# Patient Record
Sex: Female | Born: 1965
Health system: Southern US, Community
[De-identification: ages and names within clinical notes are randomized; demographics above are authoritative.]

## PROBLEM LIST (undated history)

## (undated) DIAGNOSIS — I10 Essential (primary) hypertension: Secondary | ICD-10-CM

## (undated) HISTORY — DX: Essential (primary) hypertension: I10

---

## 2004-09-10 ENCOUNTER — Other Ambulatory Visit: Payer: Self-pay

## 2004-09-10 ENCOUNTER — Emergency Department: Payer: Self-pay | Admitting: Emergency Medicine

## 2006-11-16 ENCOUNTER — Emergency Department: Payer: Self-pay | Admitting: Emergency Medicine

## 2008-11-08 ENCOUNTER — Inpatient Hospital Stay: Payer: Self-pay | Admitting: Internal Medicine

## 2008-11-23 ENCOUNTER — Ambulatory Visit: Payer: Self-pay | Admitting: Orthopedic Surgery

## 2009-02-03 HISTORY — PX: SHOULDER SURGERY: SHX246

## 2010-08-08 ENCOUNTER — Ambulatory Visit: Payer: Self-pay | Admitting: Nephrology

## 2011-08-12 ENCOUNTER — Ambulatory Visit: Payer: Self-pay

## 2014-07-13 ENCOUNTER — Other Ambulatory Visit: Payer: Self-pay | Admitting: Unknown Physician Specialty

## 2014-07-13 DIAGNOSIS — I10 Essential (primary) hypertension: Secondary | ICD-10-CM | POA: Insufficient documentation

## 2014-07-13 DIAGNOSIS — E7800 Pure hypercholesterolemia, unspecified: Secondary | ICD-10-CM | POA: Insufficient documentation

## 2014-07-13 DIAGNOSIS — E785 Hyperlipidemia, unspecified: Secondary | ICD-10-CM

## 2014-07-14 ENCOUNTER — Encounter: Payer: Self-pay | Admitting: Unknown Physician Specialty

## 2014-07-14 ENCOUNTER — Ambulatory Visit (INDEPENDENT_AMBULATORY_CARE_PROVIDER_SITE_OTHER): Payer: Managed Care, Other (non HMO) | Admitting: Unknown Physician Specialty

## 2014-07-14 VITALS — BP 123/91 | HR 60 | Temp 98.5°F | Ht 63.0 in | Wt 197.6 lb

## 2014-07-14 DIAGNOSIS — E7800 Pure hypercholesterolemia, unspecified: Secondary | ICD-10-CM

## 2014-07-14 DIAGNOSIS — Z975 Presence of (intrauterine) contraceptive device: Secondary | ICD-10-CM

## 2014-07-14 DIAGNOSIS — I10 Essential (primary) hypertension: Secondary | ICD-10-CM

## 2014-07-14 MED ORDER — ATENOLOL 50 MG PO TABS: 50.0000 mg | ORAL_TABLET | Freq: Every day | ORAL | Status: DC

## 2014-07-14 MED ORDER — AMLODIPINE BESYLATE 5 MG PO TABS: 5.0000 mg | ORAL_TABLET | Freq: Every day | ORAL | Status: DC

## 2014-07-14 MED ORDER — IRBESARTAN-HYDROCHLOROTHIAZIDE 300-12.5 MG PO TABS: 1.0000 | ORAL_TABLET | Freq: Every day | ORAL | Status: DC

## 2014-07-14 NOTE — Progress Notes (Signed)
BP 123/91 mmHg  Pulse 60  Temp(Src) 98.5 F (36.9 C) (Oral)  Ht 5\' 3"  (1.6 m)  Wt 197 lb 9.6 oz (89.631 kg)  BMI 35.01 kg/m2  SpO2 97%  LMP  (Within Months)   Subjective:    Patient ID: Rhonda Stanley, female    DOB: 29-Aug-1965, 49 y.o.   MRN: [id number]  HPI: [name] is a 49 y.o. female presenting on 07/14/2014 for Follow-up  Hypertension This is a chronic problem. Progression since onset: BP under control but dentist noted one medicatinon is causing gum hyperplasion and needed an extensive procedure.   Associated symptoms include PND. Pertinent negatives include no anxiety, blurred vision, chest pain, headaches, malaise/fatigue, neck pain, orthopnea, palpitations, peripheral edema, shortness of breath or sweats. There are no associated agents to hypertension. Risk factors for coronary artery disease include obesity. The current treatment provides significant improvement. There are no compliance problems.      Relevant past medical, surgical, family and social history reviewed and updated as indicated. Interim medical history since our last visit reviewed. Allergies and medications reviewed and updated.  Review of Systems  Constitutional: Negative for malaise/fatigue.  Eyes: Negative for blurred vision.  Respiratory: Negative for shortness of breath.   Cardiovascular: Positive for PND. Negative for chest pain, palpitations and orthopnea.  Musculoskeletal: Negative for neck pain.  Neurological: Negative for headaches.    Per HPI unless specifically indicated above     Objective:    BP 123/91 mmHg  Pulse 60  Temp(Src) 98.5 F (36.9 C) (Oral)  Ht 5\' 3"  (1.6 m)  Wt 197 lb 9.6 oz (89.631 kg)  BMI 35.01 kg/m2  SpO2 97%  LMP  (Within Months)  Wt Readings from Last 3 Encounters:  07/14/14 197 lb 9.6 oz (89.631 kg)  03/29/14 195 lb (88.451 kg)    Physical Exam  Constitutional: She is oriented to person, place, and time. She appears well-developed and  well-nourished. No distress.  HENT:  Head: Normocephalic and atraumatic.  Eyes: Conjunctivae and lids are normal. Right eye exhibits no discharge. Left eye exhibits no discharge. No scleral icterus.  Cardiovascular: Normal rate, regular rhythm and normal heart sounds.   Pulmonary/Chest: Effort normal and breath sounds normal. No respiratory distress.  Abdominal: Normal appearance and bowel sounds are normal. She exhibits no distension. There is no splenomegaly or hepatomegaly. There is no tenderness.  Musculoskeletal: Normal range of motion.  Neurological: She is alert and oriented to person, place, and time.  Skin: Skin is warm, dry and intact. No rash noted. No pallor.  Psychiatric: She has a normal mood and affect. Her behavior is normal. Judgment and thought content normal.      No results found for this or any previous visit.    Assessment & Plan:     Problem List Items Addressed This Visit      Cardiovascular and Mediastinum   Essential hypertension, benign - Primary    Stable, but having gum hyperplasia  Will reduce Amlodipine to 5 mg.  Recheck in 3 months.  She will check it at work.        Relevant Medications   amLODipine (NORVASC) 5 MG tablet   atenolol (TENORMIN) 50 MG tablet   irbesartan-hydrochlorothiazide (AVALIDE) 300-12.5 MG per tablet     Other   Hypercholesterolemia    Check Lipid panel      Relevant Medications   amLODipine (NORVASC) 5 MG tablet   atenolol (TENORMIN) 50 MG tablet   irbesartan-hydrochlorothiazide (AVALIDE)  300-12.5 MG per tablet    Other Visit Diagnoses    IUD (intrauterine device) in place        Relevant Orders    Ambulatory referral to Obstetrics / Gynecology      LIPID PANEL PICCOLO, WAIVED [Ordered] MICROALBUMIN, URINE WAIVED [Ordered] COMPREHENSIVE METABOLIC PANEL [Ordered] URIC ACID [Ordered]  Follow up plan: No Follow-up on file.  Needs PE but also needs new IUD.  Refer to gyn.  Recheck in 3 months.

## 2014-07-14 NOTE — Patient Instructions (Signed)

## 2014-07-14 NOTE — Assessment & Plan Note (Signed)
Check Lipid panel 

## 2014-07-14 NOTE — Assessment & Plan Note (Signed)
Stable, but having gum hyperplasia  Will reduce Amlodipine to 5 mg.  Recheck in 3 months.  She will check it at work.

## 2014-07-14 NOTE — Addendum Note (Signed)
Addended by: Amie Critchley on: 07/14/2014 02:50 PM   Modules accepted: Orders

## 2014-07-15 LAB — LIPID PANEL W/O CHOL/HDL RATIO
Cholesterol, Total: 241 mg/dL — ABNORMAL HIGH (ref 100–199)
HDL: 42 mg/dL (ref >39)
LDL Calculated: 172 mg/dL — ABNORMAL HIGH (ref 0–99)
TRIGLYCERIDES: 136 mg/dL (ref 0–149)
VLDL CHOLESTEROL CAL: 27 mg/dL (ref 5–40)

## 2014-07-15 LAB — MICROALBUMIN / CREATININE URINE RATIO
Creatinine, Urine: 117.2 mg/dL
MICROALB/CREAT RATIO: <2.6 mg/g creat (ref 0.0–30.0)
Microalbumin, Urine: <3 ug/mL

## 2014-07-17 ENCOUNTER — Telehealth: Payer: Self-pay

## 2014-07-17 MED ORDER — ATORVASTATIN CALCIUM 10 MG PO TABS: 10.0000 mg | ORAL_TABLET | Freq: Every day | ORAL | Status: DC

## 2014-07-17 NOTE — Telephone Encounter (Signed)
Called pt and she agreed to start Atorvastatin. She stated it was okay to send to her pharmacy. She already has an appt scheduled for 10/13/14 so the cholesterol can be rechecked then.

## 2014-07-17 NOTE — Telephone Encounter (Signed)
-----   Message from Cheryl Wicker, NP sent at 07/17/2014  8:11 AM EDT ----- Call tell patient that her cholesterol is high and recommended that she takes cholesterol medications.  I recommend that she start Atorvastatin and to recheck in 3 months.  Can I call this in for her? 

## 2014-07-17 NOTE — Telephone Encounter (Signed)
Pt agreed to start taking medication, Atorvastatin. She stated it was fine to call it in to the pharmacy.

## 2014-07-17 NOTE — Telephone Encounter (Signed)
-----   Message from Gabriel Cirri, NP sent at 07/17/2014  8:11 AM EDT ----- Call tell patient that her cholesterol is high and recommended that she takes cholesterol medications.  I recommend that she start Atorvastatin and to recheck in 3 months.  Can I call this in for her?

## 2014-07-17 NOTE — Telephone Encounter (Signed)
Left message for pt to return call.

## 2014-07-20 LAB — COMPREHENSIVE METABOLIC PANEL
A/G RATIO: 1.2 (ref 1.1–2.5)
ALT: 21 IU/L (ref 0–32)
AST: 19 IU/L (ref 0–40)
Albumin: 4.2 g/dL (ref 3.5–5.5)
Alkaline Phosphatase: 104 IU/L (ref 39–117)
BUN/Creatinine Ratio: 20 (ref 9–23)
BUN: 16 mg/dL (ref 6–24)
Bilirubin Total: 0.6 mg/dL (ref 0.0–1.2)
CALCIUM: 9.9 mg/dL (ref 8.7–10.2)
CO2: 23 mmol/L (ref 18–29)
Chloride: 98 mmol/L (ref 97–108)
Creatinine, Ser: 0.8 mg/dL (ref 0.57–1.00)
GFR calc Af Amer: 102 mL/min/{1.73_m2} (ref >59)
GFR calc non Af Amer: 88 mL/min/{1.73_m2} (ref >59)
GLOBULIN, TOTAL: 3.5 g/dL (ref 1.5–4.5)
Glucose: 102 mg/dL — ABNORMAL HIGH (ref 65–99)
POTASSIUM: 4.2 mmol/L (ref 3.5–5.2)
SODIUM: 139 mmol/L (ref 134–144)
Total Protein: 7.7 g/dL (ref 6.0–8.5)

## 2014-07-20 LAB — SPECIMEN STATUS REPORT

## 2014-07-20 LAB — URIC ACID: Uric Acid: 3.9 mg/dL (ref 2.5–7.1)

## 2014-08-08 ENCOUNTER — Other Ambulatory Visit: Payer: Self-pay

## 2014-08-16 ENCOUNTER — Encounter: Payer: Managed Care, Other (non HMO) | Admitting: Obstetrics and Gynecology

## 2014-09-18 ENCOUNTER — Telehealth: Payer: Self-pay | Admitting: Unknown Physician Specialty

## 2014-09-18 NOTE — Telephone Encounter (Signed)
E-fax came through for refill on: °Rx: atenolol (TENORMIN) 50 MG tablet °Copy in basket. °

## 2014-09-18 NOTE — Telephone Encounter (Signed)
Medication was refilled in June.

## 2014-09-19 ENCOUNTER — Other Ambulatory Visit: Payer: Self-pay | Admitting: Unknown Physician Specialty

## 2014-09-19 DIAGNOSIS — I10 Essential (primary) hypertension: Secondary | ICD-10-CM

## 2014-09-19 MED ORDER — ATENOLOL 50 MG PO TABS: 50.0000 mg | ORAL_TABLET | Freq: Every day | ORAL | Status: DC

## 2014-09-19 MED ORDER — ATORVASTATIN CALCIUM 10 MG PO TABS: 10.0000 mg | ORAL_TABLET | Freq: Every day | ORAL | Status: DC

## 2014-09-19 MED ORDER — IRBESARTAN-HYDROCHLOROTHIAZIDE 300-12.5 MG PO TABS: 1.0000 | ORAL_TABLET | Freq: Every day | ORAL | Status: DC

## 2014-09-19 NOTE — Progress Notes (Signed)
Called and left voicemail for pt to return call to schedule an appointment with CW. Will update once call is returned and appt is scheduled. Thanks.

## 2014-09-19 NOTE — Progress Notes (Signed)
Pt needs to be seen

## 2014-09-20 ENCOUNTER — Telehealth: Payer: Self-pay | Admitting: Unknown Physician Specialty

## 2014-09-20 NOTE — Telephone Encounter (Signed)
Pt called stated she has an appt on 10/13/14, wants to know if she needs to be seen prior to that date. Thanks.

## 2014-09-20 NOTE — Progress Notes (Signed)
Patient has appointment scheduled on 10/13/14.

## 2014-09-20 NOTE — Telephone Encounter (Signed)
Called and left patient a voicemail stating that she does not need another appointment before 10/13/14. We got refill requests yesterday and it looks like Rhonda Stanley refilled them for a certain amount of time and she was just saying patient needs an appointment in order to get any more refills.

## 2014-10-13 ENCOUNTER — Ambulatory Visit (INDEPENDENT_AMBULATORY_CARE_PROVIDER_SITE_OTHER): Payer: Managed Care, Other (non HMO) | Admitting: Unknown Physician Specialty

## 2014-10-13 ENCOUNTER — Ambulatory Visit: Payer: Managed Care, Other (non HMO) | Admitting: Unknown Physician Specialty

## 2014-10-13 ENCOUNTER — Encounter: Payer: Self-pay | Admitting: Unknown Physician Specialty

## 2014-10-13 VITALS — BP 129/80 | HR 70 | Temp 98.5°F | Ht 64.0 in | Wt 201.8 lb

## 2014-10-13 DIAGNOSIS — E7800 Pure hypercholesterolemia, unspecified: Secondary | ICD-10-CM

## 2014-10-13 DIAGNOSIS — I10 Essential (primary) hypertension: Secondary | ICD-10-CM | POA: Diagnosis not present

## 2014-10-13 LAB — LIPID PANEL PICCOLO, WAIVED
CHOL/HDL RATIO PICCOLO,WAIVE: 3.4 mg/dL
Cholesterol Piccolo, Waived: 170 mg/dL (ref <200)
HDL Chol Piccolo, Waived: 51 mg/dL — ABNORMAL LOW (ref >59)
LDL Chol Calc Piccolo Waived: 104 mg/dL — ABNORMAL HIGH (ref <100)
TRIGLYCERIDES PICCOLO,WAIVED: 81 mg/dL (ref <150)
VLDL CHOL CALC PICCOLO,WAIVE: 16 mg/dL (ref <30)

## 2014-10-13 LAB — AST: AST: 29 IU/L (ref 0–40)

## 2014-10-13 LAB — ALT: ALT: 29 IU/L (ref 0–32)

## 2014-10-13 MED ORDER — ATORVASTATIN CALCIUM 10 MG PO TABS: 10.0000 mg | ORAL_TABLET | Freq: Every day | ORAL | Status: DC

## 2014-10-13 MED ORDER — ATENOLOL 50 MG PO TABS: 50.0000 mg | ORAL_TABLET | Freq: Every day | ORAL | Status: DC

## 2014-10-13 MED ORDER — IRBESARTAN-HYDROCHLOROTHIAZIDE 300-12.5 MG PO TABS: 1.0000 | ORAL_TABLET | Freq: Every day | ORAL | Status: DC

## 2014-10-13 MED ORDER — AMLODIPINE BESYLATE 5 MG PO TABS: 5.0000 mg | ORAL_TABLET | Freq: Every day | ORAL | Status: DC

## 2014-10-13 NOTE — Assessment & Plan Note (Signed)
Lipid panel well improved with addition of atorvastatin  daily. Recheck in 6 months.

## 2014-10-13 NOTE — Progress Notes (Signed)
BP 129/80 mmHg  Pulse 70  Temp(Src) 98.5 F (36.9 C)  Ht  (1.626 m)  Wt 201 lb 12.8 oz (91.536 kg)  BMI 34.62 kg/m2  SpO2 97%   Subjective:    Patient ID: Rhonda Stanley, female    DOB: 11-16-1965, 49 y.o.   MRN: [id number]  HPI: Rhonda Stanley is a 50 y.o. female  Chief Complaint  Patient presents with  . Hyperlipidemia  . Hypertension   Hypertension/Hyperlipidemia: Has monitored blood pressure at work with normal readings of 120's/80's. Has done well on decreased dose  of amlodipine . Started atorvastatin  with no complaints or concerns. Denies headache, chest pain or shortness of breath.    Relevant past medical, surgical, family and social history reviewed and updated as indicated. Interim medical history since our last visit reviewed. Allergies and medications reviewed and updated.  Review of Systems  Constitutional: Negative.   HENT: Negative.   Respiratory: Negative.  Negative for cough, chest tightness, shortness of breath, wheezing and stridor.   Cardiovascular: Negative.  Negative for chest pain, palpitations and leg swelling.  Skin: Negative.  Negative for color change, pallor, rash and wound.  Psychiatric/Behavioral: Negative.  Negative for confusion and sleep disturbance. The patient is not nervous/anxious.     Per HPI unless specifically indicated above     Objective:    BP 129/80 mmHg  Pulse 70  Temp(Src) 98.5 F (36.9 C)  Ht  (1.626 m)  Wt 201 lb 12.8 oz (91.536 kg)  BMI 34.62 kg/m2  SpO2 97%  Wt Readings from Last 3 Encounters:  10/13/14 201 lb 12.8 oz (91.536 kg)  07/14/14 197 lb 9.6 oz (89.631 kg)  03/29/14 195 lb (88.451 kg)    Physical Exam  Constitutional: She is oriented to person, place, and time. She appears well-developed and well-nourished. No distress.  HENT:  Head: Normocephalic and atraumatic.  Neck: Normal range of motion.  Cardiovascular: Normal rate, regular rhythm and normal heart sounds.  Exam  reveals no gallop.   No murmur heard. Pulmonary/Chest: Effort normal and breath sounds normal. No respiratory distress. She has no wheezes. She has no rales. She exhibits no tenderness.  Neurological: She is alert and oriented to person, place, and time.  Skin: Skin is warm and dry. No rash noted. She is not diaphoretic. No erythema.  Psychiatric: She has a normal mood and affect. Her behavior is normal. Judgment and thought content normal.        Assessment & Plan:   Problem List Items Addressed This Visit      Unprioritized   Essential hypertension, benign    Well controlled on current regimen with lower dose of amlodipine .       Relevant Medications   amLODipine (NORVASC) 5 MG tablet   atenolol (TENORMIN) 50 MG tablet   irbesartan-hydrochlorothiazide (AVALIDE) 300-12.5 MG per tablet   atorvastatin (LIPITOR) 10 MG tablet   Hypercholesterolemia - Primary    Lipid panel well improved with addition of atorvastatin  daily. Recheck in 6 months.      Relevant Medications   amLODipine (NORVASC) 5 MG tablet   atenolol (TENORMIN) 50 MG tablet   irbesartan-hydrochlorothiazide (AVALIDE) 300-12.5 MG per tablet   atorvastatin (LIPITOR) 10 MG tablet   Other Relevant Orders   Lipid Panel Piccolo, Waived   ALT   AST       Follow up plan: Return in about 6 months (around 04/12/2015) for Physical Exam.

## 2014-10-13 NOTE — Assessment & Plan Note (Addendum)
Well controlled on current regimen with lower dose of amlodipine .

## 2014-12-20 ENCOUNTER — Other Ambulatory Visit: Payer: Self-pay | Admitting: Unknown Physician Specialty

## 2014-12-21 ENCOUNTER — Telehealth: Payer: Self-pay

## 2014-12-21 NOTE — Telephone Encounter (Signed)
Patient called and stated that her pharmacy is stating that she does not have any refills on her medications. So I called the pharmacy and they stated they did not have any new refills for the patient. So since the medications were already in the chart, I gave them verbally to the pharmacy. I tried to call the patient to let her know but there was no answer and no voicemail. Will try again later to let her know.

## 2014-12-22 NOTE — Telephone Encounter (Signed)
I saw that Rhonda Stanley last looked at this message so I asked her if she spoke to the patient and told her that her medications were sent in and she stated yes she did tell her that information.

## 2015-04-13 ENCOUNTER — Encounter: Payer: Managed Care, Other (non HMO) | Admitting: Unknown Physician Specialty

## 2015-05-28 ENCOUNTER — Emergency Department: Payer: Managed Care, Other (non HMO)

## 2015-05-28 ENCOUNTER — Encounter: Payer: Self-pay | Admitting: Emergency Medicine

## 2015-05-28 ENCOUNTER — Emergency Department
Admission: EM | Admit: 2015-05-28 | Discharge: 2015-05-28 | Disposition: A | Discharge status: Home / Self Care | Payer: Managed Care, Other (non HMO) | Attending: Emergency Medicine | Admitting: Emergency Medicine

## 2015-05-28 DIAGNOSIS — Z79899 Other long term (current) drug therapy: Secondary | ICD-10-CM | POA: Insufficient documentation

## 2015-05-28 DIAGNOSIS — R0789 Other chest pain: Secondary | ICD-10-CM

## 2015-05-28 DIAGNOSIS — I10 Essential (primary) hypertension: Secondary | ICD-10-CM | POA: Diagnosis not present

## 2015-05-28 DIAGNOSIS — E7800 Pure hypercholesterolemia, unspecified: Secondary | ICD-10-CM | POA: Insufficient documentation

## 2015-05-28 DIAGNOSIS — R079 Chest pain, unspecified: Secondary | ICD-10-CM | POA: Diagnosis not present

## 2015-05-28 LAB — BASIC METABOLIC PANEL
Anion gap: 6 (ref 5–15)
BUN: 16 mg/dL (ref 6–20)
CO2: 29 mmol/L (ref 22–32)
Calcium: 9.5 mg/dL (ref 8.9–10.3)
Chloride: 105 mmol/L (ref 101–111)
Creatinine, Ser: 0.78 mg/dL (ref 0.44–1.00)
GLUCOSE: 103 mg/dL — AB (ref 65–99)
POTASSIUM: 4.3 mmol/L (ref 3.5–5.1)
SODIUM: 140 mmol/L (ref 135–145)

## 2015-05-28 LAB — CBC
HEMATOCRIT: 39.4 % (ref 35.0–47.0)
Hemoglobin: 13.1 g/dL (ref 12.0–16.0)
MCH: 26.6 pg (ref 26.0–34.0)
MCHC: 33.3 g/dL (ref 32.0–36.0)
MCV: 80 fL (ref 80.0–100.0)
Platelets: 292 10*3/uL (ref 150–440)
RBC: 4.92 MIL/uL (ref 3.80–5.20)
RDW: 14.2 % (ref 11.5–14.5)
WBC: 6 10*3/uL (ref 3.6–11.0)

## 2015-05-28 LAB — TROPONIN I
Troponin I: <0.03 ng/mL (ref <0.031)
Troponin I: <0.03 ng/mL (ref <0.031)

## 2015-05-28 NOTE — ED Notes (Signed)
Repeat trop sent to lab

## 2015-05-28 NOTE — Discharge Instructions (Signed)
Chest Wall Pain °Chest wall pain is pain in or around the bones and muscles of your chest. Sometimes, an injury causes this pain. Sometimes, the cause may not be known. This pain may take several weeks or longer to get better. °HOME CARE INSTRUCTIONS  °Pay attention to any changes in your symptoms. Take these actions to help with your pain:  °· Rest as told by your health care provider.   °· Avoid activities that cause pain. These include any activities that use your chest muscles or your abdominal and side muscles to lift heavy items.    °· If directed, apply ice to the painful area: °¨ Put ice in a plastic bag. °¨ Place a towel between your skin and the bag. °¨ Leave the ice on for 20 minutes, 2-3 times per day. °· Take over-the-counter and prescription medicines only as told by your health care provider. °· Do not use tobacco products, including cigarettes, chewing tobacco, and e-cigarettes. If you need help quitting, ask your health care provider. °· Keep all follow-up visits as told by your health care provider. This is important. °SEEK MEDICAL CARE IF: °· You have a fever. °· Your chest pain becomes worse. °· You have new symptoms. °SEEK IMMEDIATE MEDICAL CARE IF: °· You have nausea or vomiting. °· You feel sweaty or light-headed. °· You have a cough with phlegm (sputum) or you cough up blood. °· You develop shortness of breath. °  °This information is not intended to replace advice given to you by your health care provider. Make sure you discuss any questions you have with your health care provider. °  °Document Released: 01/20/2005 Document Revised: 10/11/2014 Document Reviewed: 04/17/2014 °Elsevier Interactive Patient Education ©2016 [employer] ° °Chest Pain Observation °It is often hard to give a specific diagnosis for the cause of chest pain. Among other possibilities your symptoms might be caused by inadequate oxygen delivery to your heart (angina). Angina that is not treated or evaluated can lead to  a heart attack (myocardial infarction) or death. °Blood tests, electrocardiograms, and X-rays may have been done to help determine a possible cause of your chest pain. After evaluation and observation, your health care provider has determined that it is unlikely your pain was caused by an unstable condition that requires hospitalization. However, a full evaluation of your pain may need to be completed, with additional diagnostic testing as directed. It is very important to keep your follow-up appointments. Not keeping your follow-up appointments could result in permanent heart damage, disability, or death. If there is any problem keeping your follow-up appointments, you must call your health care provider. °HOME CARE INSTRUCTIONS  °Due to the slight chance that your pain could be angina, it is important to follow your health care provider's treatment plan and also maintain a healthy lifestyle: °· Maintain or work toward achieving a healthy weight. °· Stay physically active and exercise regularly. °· Decrease your salt intake. °· Eat a balanced, healthy diet. Talk to a dietitian to learn about heart-healthy foods. °· Increase your fiber intake by including whole grains, vegetables, fruits, and nuts in your diet. °· Avoid situations that cause stress, anger, or depression. °· Take medicines as advised by your health care provider. Report any side effects to your health care provider. Do not stop medicines or adjust the dosages on your own. °· Quit smoking. Do not use nicotine patches or gum until you check with your health care provider. °· Keep your blood pressure, blood sugar, and cholesterol levels within normal   limits. °· Limit alcohol intake to no more than 1 drink per day for women who are not pregnant and 2 drinks per day for men. °· Do not abuse drugs. °SEEK IMMEDIATE MEDICAL CARE IF: °You have severe chest pain or pressure which may include symptoms such as: °· You feel pain or pressure in your arms, neck,  jaw, or back. °· You have severe back or abdominal pain, feel sick to your stomach (nauseous), or throw up (vomit). °· You are sweating profusely. °· You are having a fast or irregular heartbeat. °· You feel short of breath while at rest. °· You notice increasing shortness of breath during rest, sleep, or with activity. °· You have chest pain that does not get better after rest or after taking your usual medicine. °· You wake from sleep with chest pain. °· You are unable to sleep because you cannot breathe. °· You develop a frequent cough or you are coughing up blood. °· You feel dizzy, faint, or experience extreme fatigue. °· You develop severe weakness, dizziness, fainting, or chills. °Any of these symptoms may represent a serious problem that is an emergency. Do not wait to see if the symptoms will go away. Call your local emergency services (911 in the [place] Do not drive yourself to the hospital. °MAKE SURE YOU: °· Understand these instructions. °· Will watch your condition. °· Will get help right away if you are not doing well or get worse. °  °This information is not intended to replace advice given to you by your health care provider. Make sure you discuss any questions you have with your health care provider. °  °Document Released: 02/22/2010 Document Revised: 01/25/2013 Document Reviewed: 07/22/2012 °Elsevier Interactive Patient Education ©2016 [employer] ° °

## 2015-05-28 NOTE — ED Notes (Signed)
approx x1 week of right chest wall tightness , uses repeated motion at work using right arm,

## 2015-05-28 NOTE — ED Notes (Signed)
Reports midsternal cp, worse with movement but constant.  Denies sob or n/v.  Skin w/d at this time. HS normal, NSR on monitor, lungs clear.

## 2015-05-28 NOTE — ED Provider Notes (Addendum)
[facility] Emergency Department Provider Note  ____________________________________________   I have reviewed the triage vital signs and the nursing notes.   HISTORY  Chief Complaint Chest Pain    HPI Rhonda Stanley is a 50 y.o. female who presents today complaining of right chest wall pain. 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 She has had right shoulder surgery as well a that this time the pain does not seem to localize to the shoulder. She denies any shortness of breath nausea or vomiting. The pain is nonexertional. Is worse when she touches it or changes position the wrong way, there is no leg swelling or calf swelling, does not radiate. She denies any nausea or vomiting with it. Denies any diaphoresis. She does not have a history of blood clots or PE or recent travel she denies taking exhausted his estrogens, or leg swelling. The patient states the pain has been constant for a week and a half but she wanted to get it checked out today to make sure it was okay because she used checked google and it told her she could be having serious problems.       Past Medical History  Diagnosis Date  . Hypertension     Patient Active Problem List   Diagnosis Date Noted  . Essential hypertension, benign 07/13/2014  . Hypercholesterolemia 07/13/2014    Past Surgical History  Procedure Laterality Date  . Shoulder surgery Right 2011    Current Outpatient Rx  Name  Route  Sig  Dispense  Refill  . amLODipine (NORVASC) 5 MG tablet   Oral   Take 1 tablet (5 mg total) by mouth daily.   90 tablet   3   . atenolol (TENORMIN) 50 MG tablet   Oral   Take 1 tablet (50 mg total) by mouth daily.   90 tablet   3   . atorvastatin (LIPITOR) 10 MG tablet   Oral   Take 1 tablet (10 mg total) by mouth daily.   90 tablet   3   . irbesartan-hydrochlorothiazide (AVALIDE) 300-12.5 MG per tablet   Oral   Take 1 tablet  by mouth daily.   90 tablet   3   . PARAGARD INTRAUTERINE COPPER IUD IUD   Intrauterine   1 each by Intrauterine route once.           Allergies Review of patient's allergies indicates no known allergies.  Family History  Problem Relation Age of Onset  . Hypertension Mother   . Hypertension Father     Social History Social History  Substance Use Topics  . Smoking status: Never Smoker   . Smokeless tobacco: Never Used  . Alcohol Use: 1.8 oz/week    3 Standard drinks or equivalent per week     Comment: on occasion    Review of Systems Constitutional: No fever/chills Eyes: No visual changes. ENT: No sore throat. No stiff neck no neck pain Cardiovascular: Positive chest pain. Respiratory: Denies shortness of breath. Gastrointestinal:   no vomiting.  No diarrhea.  No constipation. Genitourinary: Negative for dysuria. Musculoskeletal: Negative lower extremity swelling Skin: Negative for rash. Neurological: Negative for headaches, focal weakness or numbness. 10-point ROS otherwise negative.  ____________________________________________   PHYSICAL EXAM:  VITAL SIGNS: ED Triage Vitals  Enc Vitals Group     BP 05/28/15 1053 177/102 mmHg     Pulse Rate 05/28/15 1053 64  Resp 05/28/15 1053 18     Temp 05/28/15 1053 98.1 F (36.7 C)     Temp Source 05/28/15 1053 Oral     SpO2 05/28/15 1053 99 %     Weight 05/28/15 1054 198 lb (89.812 kg)     Height 05/28/15 1054  (1.575 m)     Head Cir --      Peak Flow --      Pain Score 05/28/15 1054 4     Pain Loc --      Pain Edu? --      Excl. in GC? --     Constitutional: Alert and oriented. Well appearing and in no acute distress. Eyes: Conjunctivae are normal. PERRL. EOMI. Head: Atraumatic. Nose: No congestion/rhinnorhea. Mouth/Throat: Mucous membranes are moist.  Oropharynx non-erythematous. Neck: No stridor.   Nontender with no meningismus Cardiovascular: Normal rate, regular rhythm. Grossly normal heart  sounds.  Good peripheral circulation. Chest: Tender to palpation in the right pectoralis muscle palpation of this area causes the patient say "ouch that's the pain right there". With a nurse chaperone present there is no evidence of shingles noted, there is no erythema, there is no cellulitis, there is no crepitus or flail chest there is no rib pain or rib fracture palpated Respiratory: Normal respiratory effort.  No retractions. Lungs CTAB. Abdominal: Soft and nontender. No distention. No guarding no rebound Back:  There is no focal tenderness or step off there is no midline tenderness there are no lesions noted. there is no CVA tenderness Musculoskeletal: No lower extremity tenderness. No joint effusions, no DVT signs strong distal pulses no edema Neurologic:  Normal speech and language. No gross focal neurologic deficits are appreciated.  Skin:  Skin is warm, dry and intact. No rash noted. Psychiatric: Mood and affect are normal. Speech and behavior are normal.  ____________________________________________   LABS (all labs ordered are listed, but only abnormal results are displayed)  Labs Reviewed  BASIC METABOLIC PANEL - Abnormal; Notable for the following:    Glucose, Bld 103 (*)    All other components within normal limits  CBC  TROPONIN I  TROPONIN I   ____________________________________________  EKG  I personally interpreted any EKGs ordered by me or triage Sinus rhythm rate cc 3 bpm no acute ST elevation or acute ST depression nonspecific ST changes noted. ____________________________________________  RADIOLOGY  I reviewed any imaging ordered by me or triage that were performed during my shift and, if possible, patient and/or family made aware of any abnormal findings. ____________________________________________   PROCEDURES  Procedure(s) performed: None  Critical Care performed: None  ____________________________________________   INITIAL IMPRESSION /  ASSESSMENT AND PLAN / ED COURSE  Pertinent labs & imaging results that were available during my care of the patient were reviewed by me and considered in my medical decision making (see chart for details).  Patient with reproducible chest wall pain per week and a half with no evidence of acute ongoing ischemia.At this time, there does not appear to be clinical evidence to support the diagnosis of pulmonary embolus, dissection, myocarditis, endocarditis, pericarditis, pericardial tamponade, acute coronary syndrome, pneumothorax, pneumonia, or any other acute intrathoracic pathology that will require admission or acute intervention. Nor is there evidence of any significant intra-abdominal pathology causing this discomfort. We have sent Cardec enzymes are reassuring, patient I think we'll be safe for discharge given very low suspicion for significant acute pathology today. ____________________________________________  ----------------------------------------- 3:43 PM on 05/28/2015 -----------------------------------------  , Abdominal exams are  benign no evidence of acute gallbladder disease patient has no complaint unless I touch her chest or move her arm, troponins are negative 2, we will advise close outpatient follow-up return precautions given and understood patient will take nonsteroidal pain medications and return if she feels worse.   FINAL CLINICAL IMPRESSION(S) / ED DIAGNOSES  Final diagnoses:  None      This chart was dictated using voice recognition software.  Despite best efforts to proofread,  errors can occur which can change meaning.     Jeanmarie Plant, MD 05/28/15 1350  Jeanmarie Plant, MD 05/28/15 [phone]

## 2015-05-28 NOTE — ED Notes (Signed)
Pt discharged home after verbalizing understanding of discharge instructions; nad noted. 

## 2015-05-28 NOTE — ED Notes (Signed)
Pt resting comfortably, continue to monitor.

## 2015-08-06 ENCOUNTER — Encounter: Payer: Managed Care, Other (non HMO) | Admitting: Unknown Physician Specialty

## 2015-08-28 ENCOUNTER — Ambulatory Visit (INDEPENDENT_AMBULATORY_CARE_PROVIDER_SITE_OTHER): Payer: Managed Care, Other (non HMO) | Admitting: Unknown Physician Specialty

## 2015-08-28 ENCOUNTER — Encounter: Payer: Self-pay | Admitting: Unknown Physician Specialty

## 2015-08-28 VITALS — BP 130/86 | HR 66 | Temp 98.5°F | Ht 63.0 in | Wt 207.8 lb

## 2015-08-28 DIAGNOSIS — I10 Essential (primary) hypertension: Secondary | ICD-10-CM | POA: Diagnosis not present

## 2015-08-28 DIAGNOSIS — E669 Obesity, unspecified: Secondary | ICD-10-CM | POA: Diagnosis not present

## 2015-08-28 DIAGNOSIS — Z0000 Encounter for general adult medical examination without abnormal findings: Secondary | ICD-10-CM | POA: Diagnosis not present

## 2015-08-28 DIAGNOSIS — E7800 Pure hypercholesterolemia, unspecified: Secondary | ICD-10-CM | POA: Diagnosis not present

## 2015-08-28 DIAGNOSIS — Z6837 Body mass index (BMI) 37.0-37.9, adult: Secondary | ICD-10-CM | POA: Insufficient documentation

## 2015-08-28 MED ORDER — ATENOLOL 50 MG PO TABS: 50.0000 mg | ORAL_TABLET | Freq: Every day | ORAL | 3 refills | Status: DC

## 2015-08-28 MED ORDER — NALTREXONE-BUPROPION HCL ER 8-90 MG PO TB12: ORAL_TABLET | ORAL | 1 refills | Status: DC

## 2015-08-28 MED ORDER — AMLODIPINE BESYLATE 5 MG PO TABS: 5.0000 mg | ORAL_TABLET | Freq: Every day | ORAL | 3 refills | Status: DC

## 2015-08-28 MED ORDER — ATORVASTATIN CALCIUM 10 MG PO TABS: 10.0000 mg | ORAL_TABLET | Freq: Every day | ORAL | 3 refills | Status: DC

## 2015-08-28 NOTE — Assessment & Plan Note (Addendum)
Discussed diet and exercise. Start Contrave.  Monitor BP

## 2015-08-28 NOTE — Progress Notes (Signed)
BP 130/86 (BP Location: Left Arm, Patient Position: Sitting, Cuff Size: Large)   Pulse 66   Temp 98.5 F (36.9 C)   Ht 5\' 3"  (1.6 m)   Wt 207 lb 12.8 oz (94.3 kg)   SpO2 97%   BMI 36.81 kg/m    Subjective:    Patient ID: Rhonda Stanley, female    DOB: 12-27-1965, 49 y.o.   MRN: [id number]  HPI: Rhonda Stanley is a 50 y.o. female  Chief Complaint  Patient presents with  . Annual Exam   Obesity "I want some diet pills."   She has done boot camp and finds "I am not getting anywhere."     Hypertension Using medications without difficulty Average home BPs Not checking  No problems or lightheadedness No chest pain with exertion or shortness of breath No Edema  Hyperlipidemia Using medications without problems: No Muscle aches   Relevant past medical, surgical, family and social history reviewed and updated as indicated. Interim medical history since our last visit reviewed. Allergies and medications reviewed and updated.  Review of Systems  Constitutional: Negative.   HENT: Negative.   Eyes: Negative.   Respiratory: Negative.   Cardiovascular: Negative.   Gastrointestinal: Negative.   Endocrine: Negative.   Genitourinary: Negative.   Musculoskeletal: Negative.        Right knee pain pain comes and goes  Skin:       Skin tags  Allergic/Immunologic: Negative.   Neurological: Negative.   Hematological: Negative.   Psychiatric/Behavioral: Negative.     Per HPI unless specifically indicated above     Objective:    BP 130/86 (BP Location: Left Arm, Patient Position: Sitting, Cuff Size: Large)   Pulse 66   Temp 98.5 F (36.9 C)   Ht 5\' 3"  (1.6 m)   Wt 207 lb 12.8 oz (94.3 kg)   SpO2 97%   BMI 36.81 kg/m   Wt Readings from Last 3 Encounters:  08/28/15 207 lb 12.8 oz (94.3 kg)  05/28/15 198 lb (89.8 kg)  10/13/14 201 lb 12.8 oz (91.5 kg)    Physical Exam  Constitutional: She is oriented to person, place, and time. She appears well-developed and  well-nourished.  HENT:  Head: Normocephalic and atraumatic.  Eyes: Pupils are equal, round, and reactive to light. Right eye exhibits no discharge. Left eye exhibits no discharge. No scleral icterus.  Neck: Normal range of motion. Neck supple. Carotid bruit is not present. No thyromegaly present.  Cardiovascular: Normal rate, regular rhythm and normal heart sounds.  Exam reveals no gallop and no friction rub.   No murmur heard. Pulmonary/Chest: Effort normal and breath sounds normal. No respiratory distress. She has no wheezes. She has no rales.  Abdominal: Soft. Bowel sounds are normal. There is no tenderness. There is no rebound.  Genitourinary: Vagina normal and uterus normal. No breast swelling, tenderness or discharge. Cervix exhibits no motion tenderness, no discharge and no friability. Right adnexum displays no mass, no tenderness and no fullness. Left adnexum displays no mass, no tenderness and no fullness.  Musculoskeletal: Normal range of motion.  Lymphadenopathy:    She has no cervical adenopathy.  Neurological: She is alert and oriented to person, place, and time.  Skin: Skin is warm, dry and intact. No rash noted.  Psychiatric: She has a normal mood and affect. Her speech is normal and behavior is normal. Judgment and thought content normal. Cognition and memory are normal.    Results for orders placed or performed  during the hospital encounter of 05/28/15  Basic metabolic panel  Result Value Ref Range   Sodium 140 135 - 145 mmol/L   Potassium 4.3 3.5 - 5.1 mmol/L   Chloride 105 101 - 111 mmol/L   CO2 29 22 - 32 mmol/L   Glucose, Bld 103 (H) 65 - 99 mg/dL   BUN 16 6 - 20 mg/dL   Creatinine, Ser 1.91 0.44 - 1.00 mg/dL   Calcium 9.5 8.9 - 47.8 mg/dL   GFR calc non Af Amer >60 >60 mL/min   GFR calc Af Amer >60 >60 mL/min   Anion gap 6 5 - 15  CBC  Result Value Ref Range   WBC 6.0 3.6 - 11.0 K/uL   RBC 4.92 3.80 - 5.20 MIL/uL   Hemoglobin 13.1 12.0 - 16.0 g/dL   HCT 29.5  62.1 - 30.8 %   MCV 80.0 80.0 - 100.0 fL   MCH 26.6 26.0 - 34.0 pg   MCHC 33.3 32.0 - 36.0 g/dL   RDW 65.7 84.6 - 96.2 %   Platelets 292 150 - 440 K/uL  Troponin I  Result Value Ref Range   Troponin I <0.03 <0.031 ng/mL  Troponin I  Result Value Ref Range   Troponin I <0.03 <0.031 ng/mL      Assessment & Plan:   Problem List Items Addressed This Visit      Unprioritized   Essential hypertension, benign    Stable, continue present medications.        Relevant Medications   atorvastatin (LIPITOR) 10 MG tablet   atenolol (TENORMIN) 50 MG tablet   amLODipine (NORVASC) 5 MG tablet   Other Relevant Orders   Comprehensive metabolic panel   Hypercholesterolemia    Stable, continue present medications.        Relevant Medications   atorvastatin (LIPITOR) 10 MG tablet   atenolol (TENORMIN) 50 MG tablet   amLODipine (NORVASC) 5 MG tablet   Other Relevant Orders   Lipid Panel w/o Chol/HDL Ratio   Obesity    Discussed diet and exercise. Start Contrave.  Monitor BP      Relevant Medications   Naltrexone-Bupropion HCl ER (CONTRAVE) 8-90 MG TB12   Other Relevant Orders   TSH    Other Visit Diagnoses    Health care maintenance    -  Primary   Relevant Orders   HIV antibody   IGP, Aptima HPV, rfx 16/18,45   CBC with Differential/Platelet   TSH       Follow up plan: Return in about 4 weeks (around 09/25/2015) for schedule skin tag removal and f/u meds.

## 2015-08-28 NOTE — Assessment & Plan Note (Signed)
Stable, continue present medications.   

## 2015-08-29 ENCOUNTER — Encounter: Payer: Self-pay | Admitting: Unknown Physician Specialty

## 2015-08-29 LAB — CBC WITH DIFFERENTIAL/PLATELET
Basophils Absolute: 0 10*3/uL (ref 0.0–0.2)
Basos: 0 %
EOS (ABSOLUTE): 0.1 10*3/uL (ref 0.0–0.4)
EOS: 1 %
HEMATOCRIT: 41.3 % (ref 34.0–46.6)
Hemoglobin: 13.2 g/dL (ref 11.1–15.9)
Immature Grans (Abs): 0 10*3/uL (ref 0.0–0.1)
Immature Granulocytes: 0 %
LYMPHS ABS: 2.3 10*3/uL (ref 0.7–3.1)
Lymphs: 29 %
MCH: 26.8 pg (ref 26.6–33.0)
MCHC: 32 g/dL (ref 31.5–35.7)
MCV: 84 fL (ref 79–97)
MONOS ABS: 0.7 10*3/uL (ref 0.1–0.9)
Monocytes: 9 %
Neutrophils Absolute: 4.7 10*3/uL (ref 1.4–7.0)
Neutrophils: 61 %
Platelets: 368 10*3/uL (ref 150–379)
RBC: 4.93 x10E6/uL (ref 3.77–5.28)
RDW: 13.9 % (ref 12.3–15.4)
WBC: 7.8 10*3/uL (ref 3.4–10.8)

## 2015-08-29 LAB — LIPID PANEL W/O CHOL/HDL RATIO
CHOLESTEROL TOTAL: 183 mg/dL (ref 100–199)
HDL: 47 mg/dL (ref >39)
LDL Calculated: 94 mg/dL (ref 0–99)
Triglycerides: 212 mg/dL — ABNORMAL HIGH (ref 0–149)
VLDL Cholesterol Cal: 42 mg/dL — ABNORMAL HIGH (ref 5–40)

## 2015-08-29 LAB — COMPREHENSIVE METABOLIC PANEL
A/G RATIO: 1.2 (ref 1.2–2.2)
ALK PHOS: 114 IU/L (ref 39–117)
ALT: 19 IU/L (ref 0–32)
AST: 19 IU/L (ref 0–40)
Albumin: 4.2 g/dL (ref 3.5–5.5)
BUN/Creatinine Ratio: 27 — ABNORMAL HIGH (ref 9–23)
BUN: 19 mg/dL (ref 6–24)
Bilirubin Total: 0.6 mg/dL (ref 0.0–1.2)
CHLORIDE: 100 mmol/L (ref 96–106)
CO2: 27 mmol/L (ref 18–29)
Calcium: 10 mg/dL (ref 8.7–10.2)
Creatinine, Ser: 0.7 mg/dL (ref 0.57–1.00)
GFR calc Af Amer: 118 mL/min/{1.73_m2} (ref >59)
GFR calc non Af Amer: 103 mL/min/{1.73_m2} (ref >59)
GLOBULIN, TOTAL: 3.6 g/dL (ref 1.5–4.5)
Glucose: 97 mg/dL (ref 65–99)
POTASSIUM: 4.7 mmol/L (ref 3.5–5.2)
SODIUM: 141 mmol/L (ref 134–144)
Total Protein: 7.8 g/dL (ref 6.0–8.5)

## 2015-08-29 LAB — TSH: TSH: 1.57 u[IU]/mL (ref 0.450–4.500)

## 2015-08-29 LAB — HIV ANTIBODY (ROUTINE TESTING W REFLEX): HIV Screen 4th Generation wRfx: NONREACTIVE

## 2015-09-02 LAB — IGP, APTIMA HPV, RFX 16/18,45
HPV Aptima: NEGATIVE
PAP SMEAR COMMENT: 0

## 2015-09-25 ENCOUNTER — Encounter: Payer: Self-pay | Admitting: Unknown Physician Specialty

## 2015-09-25 ENCOUNTER — Ambulatory Visit (INDEPENDENT_AMBULATORY_CARE_PROVIDER_SITE_OTHER): Payer: Managed Care, Other (non HMO) | Admitting: Unknown Physician Specialty

## 2015-09-25 DIAGNOSIS — L918 Other hypertrophic disorders of the skin: Secondary | ICD-10-CM | POA: Insufficient documentation

## 2015-09-25 DIAGNOSIS — E669 Obesity, unspecified: Secondary | ICD-10-CM | POA: Diagnosis not present

## 2015-09-25 NOTE — Assessment & Plan Note (Signed)
4 sites cryoed.

## 2015-09-25 NOTE — Progress Notes (Signed)
BP 123/79 (BP Location: Left Arm, Cuff Size: Large)   Pulse 64   Temp 98.5 F (36.9 C)   Ht 5' 3.8" (1.621 m)   Wt 206 lb 9.6 oz (93.7 kg)   SpO2 96%   BMI 35.69 kg/m    Subjective:    Patient ID: Rhonda Stanley [place], female    DOB: 02/12/1965, 50 y.o.   MRN: [id number]  HPI: Rhonda Stanley Hover is a 50 y.o. female  Chief Complaint  Patient presents with  . Follow-up  . skin tag removal   Obesity Pt states she is taking the Contrave and states that she "sees a difference."  She has lost a pound and is up to 4 pills/day but has only been on it for about 1 month  Relevant past medical, surgical, family and social history reviewed and updated as indicated. Interim medical history since our last visit reviewed. Allergies and medications reviewed and updated.  Review of Systems  Per HPI unless specifically indicated above     Objective:    BP 123/79 (BP Location: Left Arm, Cuff Size: Large)   Pulse 64   Temp 98.5 F (36.9 C)   Ht 5' 3.8" (1.621 m)   Wt 206 lb 9.6 oz (93.7 kg)   SpO2 96%   BMI 35.69 kg/m   Wt Readings from Last 3 Encounters:  09/25/15 206 lb 9.6 oz (93.7 kg)  08/28/15 207 lb 12.8 oz (94.3 kg)  05/28/15 198 lb (89.8 kg)    Physical Exam  Constitutional: She is oriented to person, place, and time. She appears well-developed and well-nourished. No distress.  HENT:  Head: Normocephalic and atraumatic.  Eyes: Conjunctivae and lids are normal. Right eye exhibits no discharge. Left eye exhibits no discharge. No scleral icterus.  Cardiovascular: Normal rate.   Pulmonary/Chest: Effort normal.  Abdominal: Normal appearance. There is no splenomegaly or hepatomegaly.  Musculoskeletal: Normal range of motion.  Neurological: She is alert and oriented to person, place, and time.  Skin: Skin is intact. No rash noted. No pallor.  Pt with three skin tags on chest and under breast.  One on left side of face at eye level.  These were cryoed  Psychiatric: She has a  normal mood and affect. Her behavior is normal. Judgment and thought content normal.    Results for orders placed or performed in visit on 08/28/15  CBC with Differential/Platelet  Result Value Ref Range   WBC 7.8 3.4 - 10.8 x10E3/uL   RBC 4.93 3.77 - 5.28 x10E6/uL   Hemoglobin 13.2 11.1 - 15.9 g/dL   Hematocrit 29.541.3 28.434.0 - 46.6 %   MCV 84 79 - 97 fL   MCH 26.8 26.6 - 33.0 pg   MCHC 32.0 31.5 - 35.7 g/dL   RDW 13.213.9 44.012.3 - 10.215.4 %   Platelets 368 150 - 379 x10E3/uL   Neutrophils 61 %   Lymphs 29 %   Monocytes 9 %   Eos 1 %   Basos 0 %   Neutrophils Absolute 4.7 1.4 - 7.0 x10E3/uL   Lymphocytes Absolute 2.3 0.7 - 3.1 x10E3/uL   Monocytes Absolute 0.7 0.1 - 0.9 x10E3/uL   EOS (ABSOLUTE) 0.1 0.0 - 0.4 x10E3/uL   Basophils Absolute 0.0 0.0 - 0.2 x10E3/uL   Immature Granulocytes 0 %   Immature Grans (Abs) 0.0 0.0 - 0.1 x10E3/uL  Comprehensive metabolic panel  Result Value Ref Range   Glucose 97 65 - 99 mg/dL   BUN 19 6 - 24  mg/dL   Creatinine, Ser 1.610.70 0.57 - 1.00 mg/dL   GFR calc non Af Amer 103 >59 mL/min/1.73   GFR calc Af Amer 118 >59 mL/min/1.73   BUN/Creatinine Ratio 27 (H) 9 - 23   Sodium 141 134 - 144 mmol/L   Potassium 4.7 3.5 - 5.2 mmol/L   Chloride 100 96 - 106 mmol/L   CO2 27 18 - 29 mmol/L   Calcium 10.0 8.7 - 10.2 mg/dL   Total Protein 7.8 6.0 - 8.5 g/dL   Albumin 4.2 3.5 - 5.5 g/dL   Globulin, Total 3.6 1.5 - 4.5 g/dL   Albumin/Globulin Ratio 1.2 1.2 - 2.2   Bilirubin Total 0.6 0.0 - 1.2 mg/dL   Alkaline Phosphatase 114 39 - 117 IU/L   AST 19 0 - 40 IU/L   ALT 19 0 - 32 IU/L  TSH  Result Value Ref Range   TSH 1.570 0.450 - 4.500 uIU/mL  Lipid Panel w/o Chol/HDL Ratio  Result Value Ref Range   Cholesterol, Total 183 100 - 199 mg/dL   Triglycerides 096212 (H) 0 - 149 mg/dL   HDL 47 >04>39 mg/dL   VLDL Cholesterol Cal 42 (H) 5 - 40 mg/dL   LDL Calculated 94 0 - 99 mg/dL  HIV antibody  Result Value Ref Range   HIV Screen 4th Generation wRfx Non Reactive Non  Reactive  IGP, Aptima HPV, rfx 16/18,45  Result Value Ref Range   DIAGNOSIS: Comment    Specimen adequacy: Comment    CLINICIAN PROVIDED ICD10: Comment    Performed by: Comment    PAP SMEAR COMMENT .    Note: Comment    Test Methodology Comment    HPV Aptima Negative Negative  +    Assessment & Plan:   Problem List Items Addressed This Visit      Unprioritized   Cutaneous skin tags    4 sites cryoed.        Obesity    Continue with Contrave.  Recheck in 3 months       Other Visit Diagnoses   None.      Follow up plan: Return in about 2 months (around 11/25/2015).

## 2015-09-25 NOTE — Assessment & Plan Note (Signed)
Continue with Contrave.  Recheck in 3 months

## 2015-11-08 ENCOUNTER — Other Ambulatory Visit: Payer: Self-pay | Admitting: Unknown Physician Specialty

## 2015-11-13 ENCOUNTER — Other Ambulatory Visit: Payer: Self-pay

## 2015-11-13 MED ORDER — NALTREXONE-BUPROPION HCL ER 8-90 MG PO TB12: ORAL_TABLET | ORAL | 1 refills | Status: DC

## 2015-11-13 NOTE — Telephone Encounter (Signed)
Patient would like a refill on Contrave.  Routing to provider.

## 2015-11-27 ENCOUNTER — Ambulatory Visit: Payer: Managed Care, Other (non HMO) | Admitting: Unknown Physician Specialty

## 2015-12-11 ENCOUNTER — Other Ambulatory Visit: Payer: Self-pay | Admitting: Unknown Physician Specialty

## 2015-12-11 DIAGNOSIS — I10 Essential (primary) hypertension: Secondary | ICD-10-CM

## 2016-02-02 ENCOUNTER — Other Ambulatory Visit: Payer: Self-pay | Admitting: Unknown Physician Specialty

## 2016-03-11 ENCOUNTER — Other Ambulatory Visit: Payer: Self-pay | Admitting: Unknown Physician Specialty

## 2016-03-11 DIAGNOSIS — I10 Essential (primary) hypertension: Secondary | ICD-10-CM

## 2016-03-11 NOTE — Telephone Encounter (Signed)
Needs f/u.

## 2016-03-11 NOTE — Telephone Encounter (Signed)
Letter generated and sent to patient.  

## 2016-04-13 ENCOUNTER — Other Ambulatory Visit: Payer: Self-pay | Admitting: Unknown Physician Specialty

## 2016-04-13 DIAGNOSIS — I10 Essential (primary) hypertension: Secondary | ICD-10-CM

## 2016-04-16 ENCOUNTER — Telehealth: Payer: Self-pay | Admitting: Unknown Physician Specialty

## 2016-04-16 NOTE — Telephone Encounter (Signed)
Called and confirmed with pharmacy that medication was there and ready for the patient.

## 2016-04-16 NOTE — Telephone Encounter (Signed)
Pt needs refill for amLODipine (NORVASC) 5 MG tablet [facility] [address].

## 2016-04-16 NOTE — Telephone Encounter (Signed)
Called and left patient a VM (not detailed) letting her know that her medication has already been sent in and that it should be at the pharmacy for her. I asked for her to give us a call if she had any questions or concerns.

## 2016-04-25 ENCOUNTER — Ambulatory Visit (INDEPENDENT_AMBULATORY_CARE_PROVIDER_SITE_OTHER): Payer: Commercial Managed Care - PPO | Admitting: Family Medicine

## 2016-04-25 ENCOUNTER — Encounter: Payer: Self-pay | Admitting: Family Medicine

## 2016-04-25 VITALS — BP 151/93 | HR 69 | Temp 98.8°F | Ht 63.0 in | Wt 213.0 lb

## 2016-04-25 DIAGNOSIS — J069 Acute upper respiratory infection, unspecified: Secondary | ICD-10-CM | POA: Diagnosis not present

## 2016-04-25 MED ORDER — AZITHROMYCIN 250 MG PO TABS: ORAL_TABLET | ORAL | 0 refills | Status: DC

## 2016-04-25 MED ORDER — OSELTAMIVIR PHOSPHATE 75 MG PO CAPS: 75.0000 mg | ORAL_CAPSULE | Freq: Two times a day (BID) | ORAL | 0 refills | Status: DC

## 2016-04-25 MED ORDER — HYDROCOD POLST-CPM POLST ER 10-8 MG/5ML PO SUER: 5.0000 mL | Freq: Two times a day (BID) | ORAL | 0 refills | Status: DC | PRN

## 2016-04-25 MED ORDER — BENZONATATE 100 MG PO CAPS: 200.0000 mg | ORAL_CAPSULE | Freq: Three times a day (TID) | ORAL | 0 refills | Status: DC | PRN

## 2016-04-25 NOTE — Patient Instructions (Signed)
Follow up as needed

## 2016-04-25 NOTE — Progress Notes (Signed)
   BP (!) 151/93 (BP Location: Right Arm, Patient Position: Sitting, Cuff Size: Large)   Pulse 69   Temp 98.8 F (37.1 C)   Ht 5\' 3"  (1.6 m)   Wt 213 lb (96.6 kg)   SpO2 96%   BMI 37.73 kg/m    Subjective:    Patient ID: Rhonda Stanley, female    DOB: 03/17/1965, 51 y.o.   MRN: [id number]  HPI: Rhonda Stanley is a 51 y.o. female  Chief Complaint  Patient presents with  . Headache    Started Monday 04/21/2016  . Sore Throat  . Cough  . Nasal Congestion   Patient presents with 4 days of sore throat, cough, congestion, and HA. Denies fever, chills, aches, CP, SOB. Taking alka seltzer plus, cough medicine with minimal relief. Several sick contacts at work with flu.   Relevant past medical, surgical, family and social history reviewed and updated as indicated. Interim medical history since our last visit reviewed. Allergies and medications reviewed and updated.  Review of Systems  Constitutional: Negative.   HENT: Positive for congestion and sore throat.   Eyes: Negative.   Respiratory: Positive for cough.   Cardiovascular: Negative.   Gastrointestinal: Negative.   Genitourinary: Negative.   Musculoskeletal: Negative.   Neurological: Positive for headaches.  Psychiatric/Behavioral: Negative.     Per HPI unless specifically indicated above     Objective:    BP (!) 151/93 (BP Location: Right Arm, Patient Position: Sitting, Cuff Size: Large)   Pulse 69   Temp 98.8 F (37.1 C)   Ht 5\' 3"  (1.6 m)   Wt 213 lb (96.6 kg)   SpO2 96%   BMI 37.73 kg/m   Wt Readings from Last 3 Encounters:  04/25/16 213 lb (96.6 kg)  09/25/15 206 lb 9.6 oz (93.7 kg)  08/28/15 207 lb 12.8 oz (94.3 kg)    Physical Exam  Constitutional: She is oriented to person, place, and time. She appears well-developed and well-nourished. No distress.  HENT:  Head: Atraumatic.  Neck: Normal range of motion. Neck supple.  Cardiovascular: Normal rate and normal heart sounds.   Pulmonary/Chest:  Effort normal and breath sounds normal. No respiratory distress.  Musculoskeletal: Normal range of motion.  Lymphadenopathy:    She has no cervical adenopathy.  Neurological: She is alert and oriented to person, place, and time.  Skin: Skin is warm and dry.  Psychiatric: She has a normal mood and affect. Her behavior is normal.  Nursing note and vitals reviewed.     Assessment & Plan:   Problem List Items Addressed This Visit    None    Visit Diagnoses    Upper respiratory tract infection, unspecified type    -  Primary   Given close contacts w/ flu, will send tamilfu in case despite rapid flu being neg. Will send zpak in case no improvement, tessalon and tussionex for sx relief.   Relevant Medications   azithromycin (ZITHROMAX) 250 MG tablet   oseltamivir (TAMIFLU) 75 MG capsule       Follow up plan: Return if symptoms worsen or fail to improve.

## 2016-05-14 ENCOUNTER — Other Ambulatory Visit: Payer: Self-pay | Admitting: Unknown Physician Specialty

## 2016-05-14 DIAGNOSIS — I10 Essential (primary) hypertension: Secondary | ICD-10-CM

## 2016-06-10 ENCOUNTER — Other Ambulatory Visit: Payer: Self-pay | Admitting: Unknown Physician Specialty

## 2016-06-10 DIAGNOSIS — I10 Essential (primary) hypertension: Secondary | ICD-10-CM

## 2016-06-10 MED ORDER — AMLODIPINE BESYLATE 5 MG PO TABS: 5.0000 mg | ORAL_TABLET | Freq: Every day | ORAL | 0 refills | Status: DC

## 2016-06-10 NOTE — Telephone Encounter (Signed)
Patient called in regards to a medication refill for her prescription of benzonatate 100 MG capsule. Patient asked if prescription could be supplied in the 90 day quantity instead of the 30 day quantity due to her insurance covering it more. Patient states that the prescription will be more affordable through her insurance if prescribed in a 90 day quantity. Patient stated that she also has a week left on medication. Please Advise.  Patient contact number: [phone]  Thank you.

## 2016-06-10 NOTE — Telephone Encounter (Signed)
Patient came by the office in person, and she states that this was supposed to be her amlodipine prescription. Patient states that she needs a 90 day supply of this medication sent in as it is cheaper with insurance. Scheduled patient a f/up with Elnita Maxwellheryl while I was speaking with her.

## 2016-06-10 NOTE — Telephone Encounter (Signed)
Routing to provider. Is this something that can be sent in for a 90 day supply?

## 2016-06-10 NOTE — Telephone Encounter (Signed)
Called and left patient a VM asking for her to please return my call.  

## 2016-06-10 NOTE — Telephone Encounter (Signed)
Benzonatate is usually not a medication prescibed for 3 months as it is for an acute cough.

## 2016-07-11 ENCOUNTER — Encounter: Payer: Self-pay | Admitting: Unknown Physician Specialty

## 2016-07-11 ENCOUNTER — Ambulatory Visit (INDEPENDENT_AMBULATORY_CARE_PROVIDER_SITE_OTHER): Payer: Commercial Managed Care - PPO | Admitting: Unknown Physician Specialty

## 2016-07-11 DIAGNOSIS — E7800 Pure hypercholesterolemia, unspecified: Secondary | ICD-10-CM

## 2016-07-11 DIAGNOSIS — I10 Essential (primary) hypertension: Secondary | ICD-10-CM | POA: Diagnosis not present

## 2016-07-11 DIAGNOSIS — Z6841 Body Mass Index (BMI) 40.0 and over, adult: Secondary | ICD-10-CM

## 2016-07-11 MED ORDER — ATORVASTATIN CALCIUM 10 MG PO TABS: 10.0000 mg | ORAL_TABLET | Freq: Every day | ORAL | 3 refills | Status: DC

## 2016-07-11 MED ORDER — NALTREXONE-BUPROPION HCL ER 8-90 MG PO TB12: ORAL_TABLET | ORAL | 1 refills | Status: DC

## 2016-07-11 MED ORDER — AMLODIPINE BESYLATE 5 MG PO TABS: 5.0000 mg | ORAL_TABLET | Freq: Every day | ORAL | 0 refills | Status: DC

## 2016-07-11 MED ORDER — ATENOLOL 50 MG PO TABS: 50.0000 mg | ORAL_TABLET | Freq: Every day | ORAL | 3 refills | Status: DC

## 2016-07-11 NOTE — Assessment & Plan Note (Signed)
Stable, continue present medications.   

## 2016-07-11 NOTE — Assessment & Plan Note (Signed)
Worsening since off Contrave.  Restart Contrave.

## 2016-07-11 NOTE — Progress Notes (Signed)
BP (!) 134/91 (BP Location: Left Arm, Cuff Size: Large)   Pulse 64   Temp 98.9 F (37.2 C)   Ht 5' 3.3" (1.608 m)   Wt 216 lb (98 kg)   SpO2 94%   BMI 37.90 kg/m    Subjective:    Patient ID: Rhonda Stanley, female    DOB: 12/18/1965, 51 y.o.   MRN: [id number]  HPI: Rhonda Stanley is a [age] y.o. female  Chief Complaint  Patient presents with  . Hyperlipidemia  . Hypertension   Hypertension Using medications without difficulty Average home BPs   No problems or lightheadedness No chest pain with exertion or shortness of breath No Edema  Hyperlipidemia Using medications without problems: No Muscle aches  Diet compliance:Exercise: does well with eating and exercise is difficult.  Body hurts after work.  Has an active job  Obesity She would like to restart Contrave.  States "I need some help and I can't do this on my own."    Relevant past medical, surgical, family and social history reviewed and updated as indicated. Interim medical history since our last visit reviewed. Allergies and medications reviewed and updated.  Review of Systems  Musculoskeletal: Positive for arthralgias.       Knee pain    Per HPI unless specifically indicated above     Objective:    BP (!) 134/91 (BP Location: Left Arm, Cuff Size: Large)   Pulse 64   Temp 98.9 F (37.2 C)   Ht 5' 3.3" (1.608 m)   Wt 216 lb (98 kg)   SpO2 94%   BMI 37.90 kg/m   Wt Readings from Last 3 Encounters:  07/11/16 216 lb (98 kg)  04/25/16 213 lb (96.6 kg)  09/25/15 206 lb 9.6 oz (93.7 kg)    Physical Exam  Constitutional: She is oriented to person, place, and time. She appears well-developed and well-nourished. No distress.  HENT:  Head: Normocephalic and atraumatic.  Eyes: Conjunctivae and lids are normal. Right eye exhibits no discharge. Left eye exhibits no discharge. No scleral icterus.  Neck: Normal range of motion. Neck supple. No JVD present. Carotid bruit is not present.  Cardiovascular:  Normal rate, regular rhythm and normal heart sounds.   Pulmonary/Chest: Effort normal and breath sounds normal.  Abdominal: Normal appearance. There is no splenomegaly or hepatomegaly.  Musculoskeletal: Normal range of motion.  Neurological: She is alert and oriented to person, place, and time.  Skin: Skin is warm, dry and intact. No rash noted. No pallor.  Psychiatric: She has a normal mood and affect. Her behavior is normal. Judgment and thought content normal.    Results for orders placed or performed in visit on 08/28/15  CBC with Differential/Platelet  Result Value Ref Range   WBC 7.8 3.4 - 10.8 x10E3/uL   RBC 4.93 3.77 - 5.28 x10E6/uL   Hemoglobin 13.2 11.1 - 15.9 g/dL   Hematocrit 86.541.3 78.434.0 - 46.6 %   MCV 84 79 - 97 fL   MCH 26.8 26.6 - 33.0 pg   MCHC 32.0 31.5 - 35.7 g/dL   RDW 69.613.9 29.512.3 - 28.415.4 %   Platelets 368 150 - 379 x10E3/uL   Neutrophils 61 %   Lymphs 29 %   Monocytes 9 %   Eos 1 %   Basos 0 %   Neutrophils Absolute 4.7 1.4 - 7.0 x10E3/uL   Lymphocytes Absolute 2.3 0.7 - 3.1 x10E3/uL   Monocytes Absolute 0.7 0.1 - 0.9 x10E3/uL   EOS (  ABSOLUTE) 0.1 0.0 - 0.4 x10E3/uL   Basophils Absolute 0.0 0.0 - 0.2 x10E3/uL   Immature Granulocytes 0 %   Immature Grans (Abs) 0.0 0.0 - 0.1 x10E3/uL  Comprehensive metabolic panel  Result Value Ref Range   Glucose 97 65 - 99 mg/dL   BUN 19 6 - 24 mg/dL   Creatinine, Ser 9.14 0.57 - 1.00 mg/dL   GFR calc non Af Amer 103 >59 mL/min/1.73   GFR calc Af Amer 118 >59 mL/min/1.73   BUN/Creatinine Ratio 27 (H) 9 - 23   Sodium 141 134 - 144 mmol/L   Potassium 4.7 3.5 - 5.2 mmol/L   Chloride 100 96 - 106 mmol/L   CO2 27 18 - 29 mmol/L   Calcium 10.0 8.7 - 10.2 mg/dL   Total Protein 7.8 6.0 - 8.5 g/dL   Albumin 4.2 3.5 - 5.5 g/dL   Globulin, Total 3.6 1.5 - 4.5 g/dL   Albumin/Globulin Ratio 1.2 1.2 - 2.2   Bilirubin Total 0.6 0.0 - 1.2 mg/dL   Alkaline Phosphatase 114 39 - 117 IU/L   AST 19 0 - 40 IU/L   ALT 19 0 - 32 IU/L  TSH   Result Value Ref Range   TSH 1.570 0.450 - 4.500 uIU/mL  Lipid Panel w/o Chol/HDL Ratio  Result Value Ref Range   Cholesterol, Total 183 100 - 199 mg/dL   Triglycerides 782 (H) 0 - 149 mg/dL   HDL 47 >95 mg/dL   VLDL Cholesterol Cal 42 (H) 5 - 40 mg/dL   LDL Calculated 94 0 - 99 mg/dL  HIV antibody  Result Value Ref Range   HIV Screen 4th Generation wRfx Non Reactive Non Reactive  IGP, Aptima HPV, rfx 16/18,45  Result Value Ref Range   DIAGNOSIS: Comment    Specimen adequacy: Comment    CLINICIAN PROVIDED ICD10: Comment    Performed by: Comment    PAP Smear Comment .    Note: Comment    Test Methodology Comment    HPV Aptima Negative Negative      Assessment & Plan:   Problem List Items Addressed This Visit      Unprioritized   Essential hypertension, benign    Stable, continue present medications.        Hypercholesterolemia    Stable, continue present medications.        Obesity    Worsening since off Contrave.  Restart Contrave.           Due for but will wait till physical    Follow up plan: Return for physical 4-6 weeks.

## 2016-09-12 ENCOUNTER — Ambulatory Visit (INDEPENDENT_AMBULATORY_CARE_PROVIDER_SITE_OTHER): Payer: Commercial Managed Care - PPO | Admitting: Unknown Physician Specialty

## 2016-09-12 ENCOUNTER — Encounter: Payer: Self-pay | Admitting: Unknown Physician Specialty

## 2016-09-12 VITALS — BP 138/93 | HR 67 | Temp 98.7°F | Ht 62.3 in | Wt 212.4 lb

## 2016-09-12 DIAGNOSIS — Z23 Encounter for immunization: Secondary | ICD-10-CM

## 2016-09-12 DIAGNOSIS — Z0000 Encounter for general adult medical examination without abnormal findings: Secondary | ICD-10-CM

## 2016-09-12 DIAGNOSIS — E7800 Pure hypercholesterolemia, unspecified: Secondary | ICD-10-CM

## 2016-09-12 DIAGNOSIS — E6609 Other obesity due to excess calories: Secondary | ICD-10-CM | POA: Diagnosis not present

## 2016-09-12 DIAGNOSIS — I10 Essential (primary) hypertension: Secondary | ICD-10-CM | POA: Diagnosis not present

## 2016-09-12 DIAGNOSIS — M25561 Pain in right knee: Secondary | ICD-10-CM | POA: Insufficient documentation

## 2016-09-12 DIAGNOSIS — Z6839 Body mass index (BMI) 39.0-39.9, adult: Secondary | ICD-10-CM

## 2016-09-12 NOTE — Assessment & Plan Note (Signed)
Borderline high today.  Will work on weight loss and recheck 3 months

## 2016-09-12 NOTE — Progress Notes (Signed)
BP (!) 138/93 (BP Location: Left Arm, Cuff Size: Large)   Pulse 67   Temp 98.7 F (37.1 C)   Ht 5' 2.3" (1.582 m)   Wt 212 lb 6.4 oz (96.3 kg)   BMI 38.48 kg/m    Subjective:    Patient ID: Rhonda Stanley, female    DOB: 06-17-1965, 51 y.o.   MRN: [id number]  HPI: Rhonda Stanley is a 51 y.o. female  Chief Complaint  Patient presents with  . Annual Exam  . Knee Pain    pt states she has been having right knee pain for a couple of months, left knee bothers her some but not as much as the right    Knee pain States she has swelling with constant pain.  Standing makes it worse. Started in April after having on heels and dancing all night. Though the knee pain has persisted.  Aleve without any benefit.  No catching or popping but sometimes "freezes."  Bothers doing anything.    Weight  Not taking Contrave due to insurance costs.  States knee won't allow her to go out on the track.  Not started any diets but was considering keto.  Feels like she needs some form of medicine to help her lose more weight.    Social History   Social History  . Marital status: Married    Spouse name: N/A  . Number of children: N/A  . Years of education: N/A   Occupational History  . [employer]    Social History Main Topics  . Smoking status: Never Smoker  . Smokeless tobacco: Never Used  . Alcohol use 1.8 oz/week    3 Standard drinks or equivalent per week     Comment: on occasion  . Drug use: No  . Sexual activity: Yes    Partners: Male    Birth control/ protection: IUD   Other Topics Concern  . Not on file   Social History Narrative  . No narrative on file   Family History  Problem Relation Age of Onset  . Hypertension Mother   . Hypertension Father    Past Medical History:  Diagnosis Date  . Hypertension    Past Surgical History:  Procedure Laterality Date  . SHOULDER SURGERY Right 2011    Relevant past medical, surgical, family and social history reviewed and updated as  indicated. Interim medical history since our last visit reviewed. Allergies and medications reviewed and updated.  Review of Systems  Constitutional: Negative.   HENT: Negative.   Eyes: Negative.   Respiratory: Negative.   Cardiovascular: Negative.   Gastrointestinal: Negative.   Endocrine: Negative.   Genitourinary: Negative.   Psychiatric/Behavioral: Negative.     Per HPI unless specifically indicated above     Objective:    BP (!) 138/93 (BP Location: Left Arm, Cuff Size: Large)   Pulse 67   Temp 98.7 F (37.1 C)   Ht 5' 2.3" (1.582 m)   Wt 212 lb 6.4 oz (96.3 kg)   BMI 38.48 kg/m   Wt Readings from Last 3 Encounters:  09/12/16 212 lb 6.4 oz (96.3 kg)  07/11/16 216 lb (98 kg)  04/25/16 213 lb (96.6 kg)    Physical Exam  Constitutional: She is oriented to person, place, and time. She appears well-developed and well-nourished.  HENT:  Head: Normocephalic and atraumatic.  Eyes: Pupils are equal, round, and reactive to light. Right eye exhibits no discharge. Left eye exhibits no discharge. No scleral icterus.  Neck: Normal range of motion. Neck supple. Carotid bruit is not present. No thyromegaly present.  Cardiovascular: Normal rate, regular rhythm and normal heart sounds.  Exam reveals no gallop and no friction rub.   No murmur heard. Pulmonary/Chest: Effort normal and breath sounds normal. No respiratory distress. She has no wheezes. She has no rales.  Abdominal: Soft. Bowel sounds are normal. There is no tenderness. There is no rebound.  Genitourinary: No breast swelling, tenderness or discharge.  Musculoskeletal:       Right knee: She exhibits decreased range of motion and swelling. Tenderness found. Medial joint line and lateral joint line tenderness noted.  Lymphadenopathy:    She has no cervical adenopathy.  Neurological: She is alert and oriented to person, place, and time.  Skin: Skin is warm, dry and intact. No rash noted.  Psychiatric: She has a normal  mood and affect. Her speech is normal and behavior is normal. Judgment and thought content normal. Cognition and memory are normal.    Results for orders placed or performed in visit on 08/28/15  CBC with Differential/Platelet  Result Value Ref Range   WBC 7.8 3.4 - 10.8 x10E3/uL   RBC 4.93 3.77 - 5.28 x10E6/uL   Hemoglobin 13.2 11.1 - 15.9 g/dL   Hematocrit 16.1 09.6 - 46.6 %   MCV 84 79 - 97 fL   MCH 26.8 26.6 - 33.0 pg   MCHC 32.0 31.5 - 35.7 g/dL   RDW 04.5 40.9 - 81.1 %   Platelets 368 150 - 379 x10E3/uL   Neutrophils 61 %   Lymphs 29 %   Monocytes 9 %   Eos 1 %   Basos 0 %   Neutrophils Absolute 4.7 1.4 - 7.0 x10E3/uL   Lymphocytes Absolute 2.3 0.7 - 3.1 x10E3/uL   Monocytes Absolute 0.7 0.1 - 0.9 x10E3/uL   EOS (ABSOLUTE) 0.1 0.0 - 0.4 x10E3/uL   Basophils Absolute 0.0 0.0 - 0.2 x10E3/uL   Immature Granulocytes 0 %   Immature Grans (Abs) 0.0 0.0 - 0.1 x10E3/uL  Comprehensive metabolic panel  Result Value Ref Range   Glucose 97 65 - 99 mg/dL   BUN 19 6 - 24 mg/dL   Creatinine, Ser 9.14 0.57 - 1.00 mg/dL   GFR calc non Af Amer 103 >59 mL/min/1.73   GFR calc Af Amer 118 >59 mL/min/1.73   BUN/Creatinine Ratio 27 (H) 9 - 23   Sodium 141 134 - 144 mmol/L   Potassium 4.7 3.5 - 5.2 mmol/L   Chloride 100 96 - 106 mmol/L   CO2 27 18 - 29 mmol/L   Calcium 10.0 8.7 - 10.2 mg/dL   Total Protein 7.8 6.0 - 8.5 g/dL   Albumin 4.2 3.5 - 5.5 g/dL   Globulin, Total 3.6 1.5 - 4.5 g/dL   Albumin/Globulin Ratio 1.2 1.2 - 2.2   Bilirubin Total 0.6 0.0 - 1.2 mg/dL   Alkaline Phosphatase 114 39 - 117 IU/L   AST 19 0 - 40 IU/L   ALT 19 0 - 32 IU/L  TSH  Result Value Ref Range   TSH 1.570 0.450 - 4.500 uIU/mL  Lipid Panel w/o Chol/HDL Ratio  Result Value Ref Range   Cholesterol, Total 183 100 - 199 mg/dL   Triglycerides 782 (H) 0 - 149 mg/dL   HDL 47 >95 mg/dL   VLDL Cholesterol Cal 42 (H) 5 - 40 mg/dL   LDL Calculated 94 0 - 99 mg/dL  HIV antibody  Result Value Ref Range  HIV Screen 4th Generation wRfx Non Reactive Non Reactive  IGP, Aptima HPV, rfx 16/18,45  Result Value Ref Range   DIAGNOSIS: Comment    Specimen adequacy: Comment    Clinician Provided ICD10 Comment    Performed by: Comment    PAP Smear Comment .    Note: Comment    Test Methodology Comment    HPV Aptima Negative Negative      Assessment & Plan:   Problem List Items Addressed This Visit      Unprioritized   Essential hypertension, benign    Borderline high today.  Will work on weight loss and recheck 3 months      Hypercholesterolemia    Check lipid panel       Relevant Orders   Lipid Panel w/o Chol/HDL Ratio   Knee pain, right    X-ray, consider referral to Orthopedics      Relevant Orders   DG Knee Complete 4 Views Right   Comprehensive metabolic panel   Obesity    Discussed diet and keto in particular.  Pt will check with insurance about weight loss medications       Other Visit Diagnoses    Need for tetanus, diphtheria, and acellular pertussis (Tdap) vaccine    -  Primary   Relevant Orders   Tdap vaccine greater than or equal to 7yo IM (Completed)   Annual physical exam       Relevant Orders   CBC with Differential/Platelet   TSH   MM DIGITAL SCREENING BILATERAL   VITAMIN D 25 Hydroxy (Vit-D Deficiency, Fractures)       Follow up plan: Return in about 3 months (around 12/13/2016).

## 2016-09-12 NOTE — Assessment & Plan Note (Signed)
Discussed diet and keto in particular.  Pt will check with insurance about weight loss medications

## 2016-09-12 NOTE — Patient Instructions (Addendum)
Tdap Vaccine (Tetanus, Diphtheria and Pertussis): What You Need to Know 1. Why get vaccinated? Tetanus, diphtheria and pertussis are very serious diseases. Tdap vaccine can protect [place] from these diseases. And, Tdap vaccine given to pregnant women can protect newborn babies against pertussis. TETANUS (Lockjaw) is rare in the [place] today. It causes painful muscle tightening and stiffness, usually all over the body.  It can lead to tightening of muscles in the head and neck so you can't open your mouth, swallow, or sometimes even breathe. Tetanus kills about 1 out of 10 people who are infected even after receiving the best medical care.  DIPHTHERIA is also rare in the [place] today. It can cause a thick coating to form in the back of the throat.  It can lead to breathing problems, heart failure, paralysis, and death.  PERTUSSIS (Whooping Cough) causes severe coughing spells, which can cause difficulty breathing, vomiting and disturbed sleep.  It can also lead to weight loss, incontinence, and rib fractures. Up to 2 in 100 adolescents and 5 in 100 adults with pertussis are hospitalized or have complications, which could include pneumonia or death.  These diseases are caused by bacteria. Diphtheria and pertussis are spread from person to person through secretions from coughing or sneezing. Tetanus enters the body through cuts, scratches, or wounds. Before vaccines, as many as 200,000 cases of diphtheria, 200,000 cases of pertussis, and hundreds of cases of tetanus, were reported in the [place] each year. Since vaccination began, reports of cases for tetanus and diphtheria have dropped by about 99% and for pertussis by about 80%. 2. Tdap vaccine Tdap vaccine can protect adolescents and adults from tetanus, diphtheria, and pertussis. One dose of Tdap is routinely given at age 8 or 17. People who did not get Tdap at that age should get it as soon as possible. Tdap is especially  important for healthcare professionals and anyone having close contact with a baby younger than 12 months. Pregnant women should get a dose of Tdap during every pregnancy, to protect the newborn from pertussis. Infants are most at risk for severe, life-threatening complications from pertussis. Another vaccine, called Td, protects against tetanus and diphtheria, but not pertussis. A Td booster should be given every 10 years. Tdap may be given as one of these boosters if you have never gotten Tdap before. Tdap may also be given after a severe cut or burn to prevent tetanus infection. Your doctor or the person giving you the vaccine can give you more information. Tdap may safely be given at the same time as other vaccines. 3. Some people should not get this vaccine  A person who has ever had a life-threatening allergic reaction after a previous dose of any diphtheria, tetanus or pertussis containing vaccine, OR has a severe allergy to any part of this vaccine, should not get Tdap vaccine. Tell the person giving the vaccine about any severe allergies.  Anyone who had coma or long repeated seizures within 7 days after a childhood dose of DTP or DTaP, or a previous dose of Tdap, should not get Tdap, unless a cause other than the vaccine was found. They can still get Td.  Talk to your doctor if you: ? have seizures or another nervous system problem, ? had severe pain or swelling after any vaccine containing diphtheria, tetanus or pertussis, ? ever had a condition called Guillain-Barr Syndrome (GBS), ? aren't feeling well on the day the shot is scheduled. 4. Risks With any medicine, including  vaccines, there is a chance of side effects. These are usually mild and go away on their own. Serious reactions are also possible but are rare. Most people who get Tdap vaccine do not have any problems with it. Mild problems following Tdap: (Did not interfere with activities)  Pain where the shot was given (about  3 in 4 adolescents or 2 in 3 adults)  Redness or swelling where the shot was given (about 1 person in 5)  Mild fever of at least 100.23F (up to about 1 in 25 adolescents or 1 in 100 adults)  Headache (about 3 or 4 people in 10)  Tiredness (about 1 person in 3 or 4)  Nausea, vomiting, diarrhea, stomach ache (up to 1 in 4 adolescents or 1 in 10 adults)  Chills, sore joints (about 1 person in 10)  Body aches (about 1 person in 3 or 4)  Rash, swollen glands (uncommon)  Moderate problems following Tdap: (Interfered with activities, but did not require medical attention)  Pain where the shot was given (up to 1 in 5 or 6)  Redness or swelling where the shot was given (up to about 1 in 16 adolescents or 1 in 12 adults)  Fever over 102F (about 1 in 100 adolescents or 1 in 250 adults)  Headache (about 1 in 7 adolescents or 1 in 10 adults)  Nausea, vomiting, diarrhea, stomach ache (up to 1 or 3 people in 100)  Swelling of the entire arm where the shot was given (up to about 1 in 500).  Severe problems following Tdap: (Unable to perform usual activities; required medical attention)  Swelling, severe pain, bleeding and redness in the arm where the shot was given (rare).  Problems that could happen after any vaccine:  People sometimes faint after a medical procedure, including vaccination. Sitting or lying down for about 15 minutes can help prevent fainting, and injuries caused by a fall. Tell your doctor if you feel dizzy, or have vision changes or ringing in the ears.  Some people get severe pain in the shoulder and have difficulty moving the arm where a shot was given. This happens very rarely.  Any medication can cause a severe allergic reaction. Such reactions from a vaccine are very rare, estimated at fewer than 1 in a million doses, and would happen within a few minutes to a few hours after the vaccination. As with any medicine, there is a very remote chance of a vaccine  causing a serious injury or death. The safety of vaccines is always being monitored. For more information, visit: http://www.aguilar.org/ 5. What if there is a serious problem? What should I look for? Look for anything that concerns you, such as signs of a severe allergic reaction, very high fever, or unusual behavior. Signs of a severe allergic reaction can include hives, swelling of the face and throat, difficulty breathing, a fast heartbeat, dizziness, and weakness. These would usually start a few minutes to a few hours after the vaccination. What should I do?  If you think it is a severe allergic reaction or other emergency that can't wait, call 9-1-1 or get the person to the nearest hospital. Otherwise, call your doctor.  Afterward, the reaction should be reported to the Vaccine Adverse Event Reporting System (VAERS). Your doctor might file this report, or you can do it yourself through the VAERS web site at www.vaers.SamedayNews.es, or by calling [phone]. ? VAERS does not give medical advice. 6. The National Vaccine Injury [employer]  Vaccine Injury Compensation Program (VICP) is a federal program that was created to compensate people who may have been injured by certain vaccines. Persons who believe they may have been injured by a vaccine can learn about the program and about filing a claim by calling 517-447-4961 or visiting the [place] website at GoldCloset.com.ee. There is a time limit to file a claim for compensation. 7. How can I learn more?  Ask your doctor. He or she can give you the vaccine package insert or suggest other sources of information.  Call your local or state health department.  Contact the Centers for Disease Control and Prevention (CDC): ? Call 858-572-9370 (1-800-CDC-INFO) or ? Visit CDC's website at http://hunter.com/ CDC Tdap Vaccine VIS (03/29/13) This information is not intended to replace advice given to you by your  health care provider. Make sure you discuss any questions you have with your health care provider. -------------------------------------------------------------------- Please do call to schedule your mammogram; the number to schedule one at either [facility] or [facility] is [phone] ----------------------------------------------------------------    Preventive Care 40-64 Years, Female Preventive care refers to lifestyle choices and visits with your health care provider that can promote health and wellness. What does preventive care include?  A yearly physical exam. This is also called an annual well check.  Dental exams once or twice a year.  Routine eye exams. Ask your health care provider how often you should have your eyes checked.  Personal lifestyle choices, including: ? Daily care of your teeth and gums. ? Regular physical activity. ? Eating a healthy diet. ? Avoiding tobacco and drug use. ? Limiting alcohol use. ? Practicing safe sex. ? Taking low-dose aspirin daily starting at age 53. ? Taking vitamin and mineral supplements as recommended by your health care provider. What happens during an annual well check? The services and screenings done by your health care provider during your annual well check will depend on your age, overall health, lifestyle risk factors, and family history of disease. Counseling Your health care provider may ask you questions about your:  Alcohol use.  Tobacco use.  Drug use.  Emotional well-being.  Home and relationship well-being.  Sexual activity.  Eating habits.  Work and work Statistician.  Method of birth control.  Menstrual cycle.  Pregnancy history.  Screening You may have the following tests or measurements:  Height, weight, and BMI.  Blood pressure.  Lipid and cholesterol levels. These may be checked every 5 years, or more frequently if you are over 5 years old.  Skin  check.  Lung cancer screening. You may have this screening every year starting at age 72 if you have a 30-pack-year history of smoking and currently smoke or have quit within the past 15 years.  Fecal occult blood test (FOBT) of the stool. You may have this test every year starting at age 75.  Flexible sigmoidoscopy or colonoscopy. You may have a sigmoidoscopy every 5 years or a colonoscopy every 10 years starting at age 16.  Hepatitis C blood test.  Hepatitis B blood test.  Sexually transmitted disease (STD) testing.  Diabetes screening. This is done by checking your blood sugar (glucose) after you have not eaten for a while (fasting). You may have this done every 1-3 years.  Mammogram. This may be done every 1-2 years. Talk to your health care provider about when you should start having regular mammograms. This may depend on whether you have a family history of breast cancer.  BRCA-related  cancer screening. This may be done if you have a family history of breast, ovarian, tubal, or peritoneal cancers.  Pelvic exam and Pap test. This may be done every 3 years starting at age 78. Starting at age 75, this may be done every 5 years if you have a Pap test in combination with an HPV test.  Bone density scan. This is done to screen for osteoporosis. You may have this scan if you are at high risk for osteoporosis.  Discuss your test results, treatment options, and if necessary, the need for more tests with your health care provider. Vaccines Your health care provider may recommend certain vaccines, such as:  Influenza vaccine. This is recommended every year.  Tetanus, diphtheria, and acellular pertussis (Tdap, Td) vaccine. You may need a Td booster every 10 years.  Varicella vaccine. You may need this if you have not been vaccinated.  Zoster vaccine. You may need this after age 67.  Measles, mumps, and rubella (MMR) vaccine. You may need at least one dose of MMR if you were born in 1957  or later. You may also need a second dose.  Pneumococcal 13-valent conjugate (PCV13) vaccine. You may need this if you have certain conditions and were not previously vaccinated.  Pneumococcal polysaccharide (PPSV23) vaccine. You may need one or two doses if you smoke cigarettes or if you have certain conditions.  Meningococcal vaccine. You may need this if you have certain conditions.  Hepatitis A vaccine. You may need this if you have certain conditions or if you travel or work in places where you may be exposed to hepatitis A.  Hepatitis B vaccine. You may need this if you have certain conditions or if you travel or work in places where you may be exposed to hepatitis B.  Haemophilus influenzae type b (Hib) vaccine. You may need this if you have certain conditions.  Talk to your health care provider about which screenings and vaccines you need and how often you need them. This information is not intended to replace advice given to you by your health care provider. Make sure you discuss any questions you have with your health care provider. Document Released: 02/16/2015 Document Revised: 10/10/2015 Document Reviewed: 11/21/2014 Elsevier Interactive Patient Education  2017 [employer].  Preventive Care 40-64 Years, Female Preventive care refers to lifestyle choices and visits with your health care provider that can promote health and wellness. What does preventive care include?  A yearly physical exam. This is also called an annual well check.  Dental exams once or twice a year.  Routine eye exams. Ask your health care provider how often you should have your eyes checked.  Personal lifestyle choices, including: ? Daily care of your teeth and gums. ? Regular physical activity. ? Eating a healthy diet. ? Avoiding tobacco and drug use. ? Limiting alcohol use. ? Practicing safe sex. ? Taking low-dose aspirin daily starting at age 61. ? Taking vitamin and mineral supplements as  recommended by your health care provider. What happens during an annual well check? The services and screenings done by your health care provider during your annual well check will depend on your age, overall health, lifestyle risk factors, and family history of disease. Counseling Your health care provider may ask you questions about your:  Alcohol use.  Tobacco use.  Drug use.  Emotional well-being.  Home and relationship well-being.  Sexual activity.  Eating habits.  Work and work Astronomer.  Method of birth control.  Menstrual cycle.  Pregnancy history.  Screening You may have the following tests or measurements:  Height, weight, and BMI.  Blood pressure.  Lipid and cholesterol levels. These may be checked every 5 years, or more frequently if you are over 6 years old.  Skin check.  Lung cancer screening. You may have this screening every year starting at age 61 if you have a 30-pack-year history of smoking and currently smoke or have quit within the past 15 years.  Fecal occult blood test (FOBT) of the stool. You may have this test every year starting at age 92.  Flexible sigmoidoscopy or colonoscopy. You may have a sigmoidoscopy every 5 years or a colonoscopy every 10 years starting at age 37.  Hepatitis C blood test.  Hepatitis B blood test.  Sexually transmitted disease (STD) testing.  Diabetes screening. This is done by checking your blood sugar (glucose) after you have not eaten for a while (fasting). You may have this done every 1-3 years.  Mammogram. This may be done every 1-2 years. Talk to your health care provider about when you should start having regular mammograms. This may depend on whether you have a family history of breast cancer.  BRCA-related cancer screening. This may be done if you have a family history of breast, ovarian, tubal, or peritoneal cancers.  Pelvic exam and Pap test. This may be done every 3 years starting at age 90.  Starting at age 43, this may be done every 5 years if you have a Pap test in combination with an HPV test.  Bone density scan. This is done to screen for osteoporosis. You may have this scan if you are at high risk for osteoporosis.  Discuss your test results, treatment options, and if necessary, the need for more tests with your health care provider. Vaccines Your health care provider may recommend certain vaccines, such as:  Influenza vaccine. This is recommended every year.  Tetanus, diphtheria, and acellular pertussis (Tdap, Td) vaccine. You may need a Td booster every 10 years.  Varicella vaccine. You may need this if you have not been vaccinated.  Zoster vaccine. You may need this after age 80.  Measles, mumps, and rubella (MMR) vaccine. You may need at least one dose of MMR if you were born in 1957 or later. You may also need a second dose.  Pneumococcal 13-valent conjugate (PCV13) vaccine. You may need this if you have certain conditions and were not previously vaccinated.  Pneumococcal polysaccharide (PPSV23) vaccine. You may need one or two doses if you smoke cigarettes or if you have certain conditions.  Meningococcal vaccine. You may need this if you have certain conditions.  Hepatitis A vaccine. You may need this if you have certain conditions or if you travel or work in places where you may be exposed to hepatitis A.  Hepatitis B vaccine. You may need this if you have certain conditions or if you travel or work in places where you may be exposed to hepatitis B.  Haemophilus influenzae type b (Hib) vaccine. You may need this if you have certain conditions.  Talk to your health care provider about which screenings and vaccines you need and how often you need them. This information is not intended to replace advice given to you by your health care provider. Make sure you discuss any questions you have with your health care provider. Document Released: 02/16/2015 Document  Revised: 10/10/2015 Document Reviewed: 11/21/2014 Elsevier Interactive Patient Education  2017 [employer].

## 2016-09-12 NOTE — Assessment & Plan Note (Signed)
X-ray, consider referral to Orthopedics

## 2016-09-12 NOTE — Assessment & Plan Note (Signed)
Check lipid panel  

## 2016-09-13 LAB — CBC WITH DIFFERENTIAL/PLATELET
Basophils Absolute: 0 10*3/uL (ref 0.0–0.2)
Basos: 0 %
EOS (ABSOLUTE): 0.1 10*3/uL (ref 0.0–0.4)
EOS: 1 %
HEMOGLOBIN: 13.1 g/dL (ref 11.1–15.9)
Hematocrit: 40.7 % (ref 34.0–46.6)
IMMATURE GRANS (ABS): 0 10*3/uL (ref 0.0–0.1)
Immature Granulocytes: 0 %
LYMPHS ABS: 2.1 10*3/uL (ref 0.7–3.1)
LYMPHS: 35 %
MCH: 26.1 pg — AB (ref 26.6–33.0)
MCHC: 32.2 g/dL (ref 31.5–35.7)
MCV: 81 fL (ref 79–97)
MONOCYTES: 6 %
Monocytes Absolute: 0.4 10*3/uL (ref 0.1–0.9)
Neutrophils Absolute: 3.4 10*3/uL (ref 1.4–7.0)
Neutrophils: 58 %
Platelets: 310 10*3/uL (ref 150–379)
RBC: 5.02 x10E6/uL (ref 3.77–5.28)
RDW: 14.5 % (ref 12.3–15.4)
WBC: 5.9 10*3/uL (ref 3.4–10.8)

## 2016-09-13 LAB — COMPREHENSIVE METABOLIC PANEL
A/G RATIO: 1.5 (ref 1.2–2.2)
ALT: 17 IU/L (ref 0–32)
AST: 18 IU/L (ref 0–40)
Albumin: 4.4 g/dL (ref 3.5–5.5)
Alkaline Phosphatase: 105 IU/L (ref 39–117)
BILIRUBIN TOTAL: 0.4 mg/dL (ref 0.0–1.2)
BUN / CREAT RATIO: 21 (ref 9–23)
BUN: 15 mg/dL (ref 6–24)
CHLORIDE: 103 mmol/L (ref 96–106)
CO2: 24 mmol/L (ref 20–29)
Calcium: 9.7 mg/dL (ref 8.7–10.2)
Creatinine, Ser: 0.7 mg/dL (ref 0.57–1.00)
GFR, EST AFRICAN AMERICAN: 118 mL/min/{1.73_m2} (ref >59)
GFR, EST NON AFRICAN AMERICAN: 102 mL/min/{1.73_m2} (ref >59)
Globulin, Total: 2.9 g/dL (ref 1.5–4.5)
Glucose: 85 mg/dL (ref 65–99)
POTASSIUM: 4.2 mmol/L (ref 3.5–5.2)
Sodium: 143 mmol/L (ref 134–144)
TOTAL PROTEIN: 7.3 g/dL (ref 6.0–8.5)

## 2016-09-13 LAB — LIPID PANEL W/O CHOL/HDL RATIO
Cholesterol, Total: 153 mg/dL (ref 100–199)
HDL: 48 mg/dL (ref >39)
LDL Calculated: 91 mg/dL (ref 0–99)
Triglycerides: 72 mg/dL (ref 0–149)
VLDL CHOLESTEROL CAL: 14 mg/dL (ref 5–40)

## 2016-09-13 LAB — TSH: TSH: 2.01 u[IU]/mL (ref 0.450–4.500)

## 2016-09-13 LAB — VITAMIN D 25 HYDROXY (VIT D DEFICIENCY, FRACTURES): VIT D 25 HYDROXY: 14.5 ng/mL — AB (ref 30.0–100.0)

## 2016-09-15 ENCOUNTER — Ambulatory Visit
Admission: RE | Admit: 2016-09-15 | Discharge: 2016-09-15 | Disposition: A | Discharge status: Home / Self Care | Payer: Commercial Managed Care - PPO | Source: Ambulatory Visit | Attending: Unknown Physician Specialty | Admitting: Unknown Physician Specialty

## 2016-09-15 ENCOUNTER — Encounter: Payer: Self-pay | Admitting: Unknown Physician Specialty

## 2016-09-15 DIAGNOSIS — M25561 Pain in right knee: Secondary | ICD-10-CM | POA: Diagnosis present

## 2016-09-15 DIAGNOSIS — M1711 Unilateral primary osteoarthritis, right knee: Secondary | ICD-10-CM | POA: Insufficient documentation

## 2016-09-15 DIAGNOSIS — M25461 Effusion, right knee: Secondary | ICD-10-CM | POA: Insufficient documentation

## 2016-09-16 ENCOUNTER — Telehealth: Payer: Self-pay | Admitting: Unknown Physician Specialty

## 2016-09-16 DIAGNOSIS — M25561 Pain in right knee: Secondary | ICD-10-CM

## 2016-09-16 MED ORDER — TRAMADOL HCL 50 MG PO TABS: 50.0000 mg | ORAL_TABLET | Freq: Three times a day (TID) | ORAL | 0 refills | Status: DC | PRN

## 2016-09-16 NOTE — Telephone Encounter (Signed)
Patient had xray on her knee yesterday and is calling to get the results.  She states she is in a lot of pain.  [phone]  Thank You

## 2016-09-16 NOTE — Telephone Encounter (Signed)
Routing to provider  

## 2016-09-22 ENCOUNTER — Other Ambulatory Visit: Payer: Self-pay | Admitting: Unknown Physician Specialty

## 2016-09-22 DIAGNOSIS — I10 Essential (primary) hypertension: Secondary | ICD-10-CM

## 2016-09-26 ENCOUNTER — Telehealth: Payer: Self-pay | Admitting: Unknown Physician Specialty

## 2016-09-26 NOTE — Telephone Encounter (Signed)
Called and left patient a VM letting her know that her prescription was at the pharmacy for her.

## 2016-09-26 NOTE — Telephone Encounter (Signed)
Called [facility] because according to chart, prescription was sent in 09/22/16. Pharmacy states that they got the prescription but have not filled it yet. I asked for them to please get it ready for the patient. Will call and let her know.

## 2016-09-26 NOTE — Telephone Encounter (Signed)
Patient needs her BP medication AmLODipine sent in to [facility]  Thank You  Mickie can be reached @ [phone]

## 2016-10-01 ENCOUNTER — Ambulatory Visit (INDEPENDENT_AMBULATORY_CARE_PROVIDER_SITE_OTHER): Payer: Commercial Managed Care - PPO | Admitting: Physician Assistant

## 2016-10-01 ENCOUNTER — Encounter (INDEPENDENT_AMBULATORY_CARE_PROVIDER_SITE_OTHER): Payer: Self-pay | Admitting: Physician Assistant

## 2016-10-01 DIAGNOSIS — M25561 Pain in right knee: Secondary | ICD-10-CM

## 2016-10-01 MED ORDER — NAPROXEN 500 MG PO TABS: 500.0000 mg | ORAL_TABLET | Freq: Two times a day (BID) | ORAL | 2 refills | Status: DC

## 2016-10-01 MED ORDER — BUPIVACAINE HCL 0.25 % IJ SOLN
6.0000 mL | INTRAMUSCULAR | Status: AC | PRN
  Administered 2016-10-01: 6 mL via INTRA_ARTICULAR

## 2016-10-01 MED ORDER — METHYLPREDNISOLONE ACETATE 40 MG/ML IJ SUSP
40.0000 mg | INTRAMUSCULAR | Status: AC | PRN
  Administered 2016-10-01: 40 mg via INTRA_ARTICULAR

## 2016-10-01 NOTE — Progress Notes (Signed)
Office Visit Note   Patient: Rhonda Stanley           Date of Birth: [birth year]           MRN: [id number] Visit Date: 10/01/2016              Requested by: Gabriel Cirri, NP [address], [place] [address] PCP: Gabriel Cirri, NP   Assessment & Plan: Visit Diagnoses: No diagnosis found.  Plan: She'll work on Dance movement psychotherapist. Start her on prescription naproxen. No other NSAIDs while on this medication. We'll see her back in 2 weeks check her progress lack of. Pain persist our mechanical symptoms persist would recommend MRI to rule out meniscal tear.  Follow-Up Instructions: Return in about 2 weeks (around 10/15/2016).   Orders:  No orders of the defined types were placed in this encounter.  Meds ordered this encounter  Medications  . naproxen (NAPROSYN) 500 MG tablet    Sig: Take 1 tablet (500 mg total) by mouth 2 (two) times daily with a meal.    Dispense:  60 tablet    Refill:  2      Procedures: Large Joint Inj Date/Time: 10/01/2016 5:18 PM Performed by: Kirtland Bouchard Authorized by: Kirtland Bouchard   Consent Given by:  Patient Indications:  Pain Location:  Knee Site:  R knee Needle Size:  22 G Approach:  Anterolateral Ultrasound Guidance: No   Fluoroscopic Guidance: No   Medications:  40 mg methylPREDNISolone acetate 40 MG/ML; 6 mL bupivacaine 0.25 % Aspiration Attempted: Yes   Aspirate amount (mL):  55 Aspirate:  Yellow Patient tolerance:  Patient tolerated the procedure well with no immediate complications     Clinical Data: No additional findings.   Subjective: Chief Complaint  Patient presents with  . Right Knee - Pain    HPI Rhonda Stanley is a 51 year old female comes in today with right knee pain. She saw her primary care physician who ordered radiographs of her knee knees were performed on 09/15/2016 and showed mild medial compartmental and patellofemoral joint changes. No acute fracture. He's been taking tramadol for pain and has helped  some also taken over-the-counter Aleve which helps a little bit. Pains in and the knee since April she is wearing some heels but had no injury. She's having painful popping in the knee and some giving way at times. States pain in these constant worse with prolonged sitting and also getting up from a sitting position. She has trouble going upstairs due to the pain. She notes the knee does swell at times. Review of Systems No chest pain shortness breath fevers chills nausea vomiting.  Objective: Vital Signs: There were no vitals taken for this visit.  Physical Exam  Constitutional: She is oriented to person, place, and time. She appears well-developed and well-nourished. No distress.  Neurological: She is alert and oriented to person, place, and time.  Skin: She is not diaphoretic.  Psychiatric: She has a normal mood and affect. Her behavior is normal.    Ortho Exam Bilateral knee she has full extension full flexion. Forced flexion of the right knee causes pain. She has no instability valgus varus stressing of either knee. Moderate effusion of the right knee compared to left no abnormal warmth erythema rashes skin lesions. Anterior drawer is negative bilaterally. She has tenderness peripatellar region of the right knee and also the medial joint line of the right knee. Specialty Comments:  No specialty comments available.  Imaging: No results found.  PMFS History: Patient Active Problem List   Diagnosis Date Noted  . Knee pain, right 09/12/2016  . Cutaneous skin tags 09/25/2015  . Obesity 08/28/2015  . Essential hypertension, benign 07/13/2014  . Hypercholesterolemia 07/13/2014   Past Medical History:  Diagnosis Date  . Hypertension     Family History  Problem Relation Age of Onset  . Hypertension Mother   . Hypertension Father     Past Surgical History:  Procedure Laterality Date  . SHOULDER SURGERY Right 2011   Social History   Occupational History  . Rhonda Stanley    Social  History Main Topics  . Smoking status: Never Smoker  . Smokeless tobacco: Never Used  . Alcohol use 1.8 oz/week    3 Standard drinks or equivalent per week     Comment: on occasion  . Drug use: No  . Sexual activity: Yes    Partners: Male    Birth control/ protection: IUD

## 2016-10-15 ENCOUNTER — Ambulatory Visit (INDEPENDENT_AMBULATORY_CARE_PROVIDER_SITE_OTHER): Payer: Commercial Managed Care - PPO | Admitting: Orthopaedic Surgery

## 2016-10-15 DIAGNOSIS — G8929 Other chronic pain: Secondary | ICD-10-CM | POA: Diagnosis not present

## 2016-10-15 DIAGNOSIS — M25561 Pain in right knee: Secondary | ICD-10-CM | POA: Diagnosis not present

## 2016-10-15 NOTE — Progress Notes (Signed)
The patient is continue to follow-up with severe right knee pain. She is on her feet all day long and by the end of her 8 hour shift her knees are hurting quite a bit. Her job is at least helping with trying to get some type of mat to have an her workplace. She has already had steroid injections in her right knee and this is helped minimally. She's work on activity modification as well as Dance movement psychotherapistquad strengthening. She's work on weight loss. Her pain is daily and she's having locking catching in the knee and giving way. She points the medial side of her knee and posterior minute as a source of her pain.  On exam there is no knee joint effusion. Her knee does have slight varus malalignment. There is significant medial joint line tenderness and a mild effusion. Her range of motion is full but quite painful. Again x-rays show medial joint space narrowing of her right knee from August of this year.  At this point given the failure of all conservative treatment modalities and MRI is warranted to assess the cartilage of her knee and rule out a meniscal tear. She is requesting this as well and I agree with a from a clinical and physical exam standpoint. She is unable to take any time off from work and she'll continue anti-inflammatories in the interim. We'll see her back once the MRI is obtained of her right knee.

## 2016-10-16 ENCOUNTER — Other Ambulatory Visit (INDEPENDENT_AMBULATORY_CARE_PROVIDER_SITE_OTHER): Payer: Self-pay

## 2016-10-16 DIAGNOSIS — G8929 Other chronic pain: Secondary | ICD-10-CM

## 2016-10-16 DIAGNOSIS — M25561 Pain in right knee: Principal | ICD-10-CM

## 2016-10-29 ENCOUNTER — Ambulatory Visit (INDEPENDENT_AMBULATORY_CARE_PROVIDER_SITE_OTHER): Payer: Commercial Managed Care - PPO | Admitting: Orthopaedic Surgery

## 2016-11-10 ENCOUNTER — Ambulatory Visit (INDEPENDENT_AMBULATORY_CARE_PROVIDER_SITE_OTHER): Payer: Commercial Managed Care - PPO | Admitting: Orthopaedic Surgery

## 2016-12-10 ENCOUNTER — Other Ambulatory Visit: Payer: Self-pay | Admitting: Unknown Physician Specialty

## 2016-12-10 DIAGNOSIS — I10 Essential (primary) hypertension: Secondary | ICD-10-CM

## 2016-12-11 ENCOUNTER — Other Ambulatory Visit (INDEPENDENT_AMBULATORY_CARE_PROVIDER_SITE_OTHER): Payer: Self-pay | Admitting: Orthopaedic Surgery

## 2016-12-14 ENCOUNTER — Ambulatory Visit
Admission: RE | Admit: 2016-12-14 | Discharge: 2016-12-14 | Disposition: A | Discharge status: Home / Self Care | Payer: Commercial Managed Care - PPO | Source: Ambulatory Visit | Attending: Orthopaedic Surgery | Admitting: Orthopaedic Surgery

## 2016-12-14 DIAGNOSIS — G8929 Other chronic pain: Secondary | ICD-10-CM

## 2016-12-14 DIAGNOSIS — M25561 Pain in right knee: Principal | ICD-10-CM

## 2016-12-19 ENCOUNTER — Encounter: Payer: Self-pay | Admitting: Unknown Physician Specialty

## 2016-12-19 ENCOUNTER — Ambulatory Visit: Payer: Commercial Managed Care - PPO | Admitting: Unknown Physician Specialty

## 2016-12-19 DIAGNOSIS — I10 Essential (primary) hypertension: Secondary | ICD-10-CM | POA: Diagnosis not present

## 2016-12-19 DIAGNOSIS — E559 Vitamin D deficiency, unspecified: Secondary | ICD-10-CM | POA: Diagnosis not present

## 2016-12-19 DIAGNOSIS — M25561 Pain in right knee: Secondary | ICD-10-CM | POA: Diagnosis not present

## 2016-12-19 DIAGNOSIS — G8929 Other chronic pain: Secondary | ICD-10-CM | POA: Diagnosis not present

## 2016-12-19 MED ORDER — HYDROCHLOROTHIAZIDE 12.5 MG PO CAPS: 12.5000 mg | ORAL_CAPSULE | Freq: Every day | ORAL | 1 refills | Status: DC

## 2016-12-19 MED ORDER — VITAMIN D (ERGOCALCIFEROL) 1.25 MG (50000 UNIT) PO CAPS: 50000.0000 [IU] | ORAL_CAPSULE | ORAL | 0 refills | Status: DC

## 2016-12-19 NOTE — Assessment & Plan Note (Signed)
Add weekly Vitamin D prescription.  Recheck in 3 months

## 2016-12-19 NOTE — Assessment & Plan Note (Addendum)
Reviewed MRI and discussed the results.  Will get back to Orthopedics about a management plan.

## 2016-12-19 NOTE — Progress Notes (Signed)
BP (!) 159/92 (BP Location: Left Arm, Cuff Size: Large)   Pulse 61   Temp 98.4 F (36.9 C) (Oral)   Wt 208 lb (94.3 kg)   SpO2 98%   BMI 37.68 kg/m    Subjective:    Patient ID: Rhonda Stanley, female    DOB: 05/26/1965, 51 y.o.   MRN: [id number]  HPI: Rhonda Stanley is a 51 y.o. female  Chief Complaint  Patient presents with  . Hypertension    3 month f/up   Hypertension Using medications without difficulty Average home BPs not checking   No problems or lightheadedness No chest pain with exertion or shortness of breath No Edema  Vit D deficiency Very low Vitamin D last visit.  No replacement taken.  She is having fatigue  Knee pain Seeing Orthopedics for right knee pain.  She got an MRI. This shows significant OA plus meniscus tear.  States Tramadol and Naprosyn were no longer working   Relevant past medical, surgical, family and social history reviewed and updated as indicated. Interim medical history since our last visit reviewed. Allergies and medications reviewed and updated.  Review of Systems  Constitutional: Positive for activity change and fatigue.  HENT: Negative.   Respiratory: Negative for shortness of breath.     Per HPI unless specifically indicated above     Objective:    BP (!) 159/92 (BP Location: Left Arm, Cuff Size: Large)   Pulse 61   Temp 98.4 F (36.9 C) (Oral)   Wt 208 lb (94.3 kg)   SpO2 98%   BMI 37.68 kg/m   Wt Readings from Last 3 Encounters:  12/19/16 208 lb (94.3 kg)  09/12/16 212 lb 6.4 oz (96.3 kg)  07/11/16 216 lb (98 kg)    Physical Exam  Constitutional: She is oriented to person, place, and time. She appears well-developed and well-nourished. No distress.  HENT:  Head: Normocephalic and atraumatic.  Eyes: Conjunctivae and lids are normal. Right eye exhibits no discharge. Left eye exhibits no discharge. No scleral icterus.  Neck: Normal range of motion. Neck supple. No JVD present. Carotid bruit is not present.    Cardiovascular: Normal rate, regular rhythm and normal heart sounds.  Pulmonary/Chest: Effort normal and breath sounds normal.  Abdominal: Normal appearance. There is no splenomegaly or hepatomegaly.  Musculoskeletal: Normal range of motion.  Neurological: She is alert and oriented to person, place, and time.  Skin: Skin is warm, dry and intact. No rash noted. No pallor.  Psychiatric: She has a normal mood and affect. Her behavior is normal. Judgment and thought content normal.    Results for orders placed or performed in visit on 09/12/16  CBC with Differential/Platelet  Result Value Ref Range   WBC 5.9 3.4 - 10.8 x10E3/uL   RBC 5.02 3.77 - 5.28 x10E6/uL   Hemoglobin 13.1 11.1 - 15.9 g/dL   Hematocrit 69.640.7 29.534.0 - 46.6 %   MCV 81 79 - 97 fL   MCH 26.1 (L) 26.6 - 33.0 pg   MCHC 32.2 31.5 - 35.7 g/dL   RDW 28.414.5 13.212.3 - 44.015.4 %   Platelets 310 150 - 379 x10E3/uL   Neutrophils 58 Not Estab. %   Lymphs 35 Not Estab. %   Monocytes 6 Not Estab. %   Eos 1 Not Estab. %   Basos 0 Not Estab. %   Neutrophils Absolute 3.4 1.4 - 7.0 x10E3/uL   Lymphocytes Absolute 2.1 0.7 - 3.1 x10E3/uL   Monocytes Absolute 0.4  0.1 - 0.9 x10E3/uL   EOS (ABSOLUTE) 0.1 0.0 - 0.4 x10E3/uL   Basophils Absolute 0.0 0.0 - 0.2 x10E3/uL   Immature Granulocytes 0 Not Estab. %   Immature Grans (Abs) 0.0 0.0 - 0.1 x10E3/uL  Comprehensive metabolic panel  Result Value Ref Range   Glucose 85 65 - 99 mg/dL   BUN 15 6 - 24 mg/dL   Creatinine, Ser 1.610.70 0.57 - 1.00 mg/dL   GFR calc non Af Amer 102 >59 mL/min/1.73   GFR calc Af Amer 118 >59 mL/min/1.73   BUN/Creatinine Ratio 21 9 - 23   Sodium 143 134 - 144 mmol/L   Potassium 4.2 3.5 - 5.2 mmol/L   Chloride 103 96 - 106 mmol/L   CO2 24 20 - 29 mmol/L   Calcium 9.7 8.7 - 10.2 mg/dL   Total Protein 7.3 6.0 - 8.5 g/dL   Albumin 4.4 3.5 - 5.5 g/dL   Globulin, Total 2.9 1.5 - 4.5 g/dL   Albumin/Globulin Ratio 1.5 1.2 - 2.2   Bilirubin Total 0.4 0.0 - 1.2 mg/dL    Alkaline Phosphatase 105 39 - 117 IU/L   AST 18 0 - 40 IU/L   ALT 17 0 - 32 IU/L  Lipid Panel w/o Chol/HDL Ratio  Result Value Ref Range   Cholesterol, Total 153 100 - 199 mg/dL   Triglycerides 72 0 - 149 mg/dL   HDL 48 >09>39 mg/dL   VLDL Cholesterol Cal 14 5 - 40 mg/dL   LDL Calculated 91 0 - 99 mg/dL  TSH  Result Value Ref Range   TSH 2.010 0.450 - 4.500 uIU/mL  VITAMIN D 25 Hydroxy (Vit-D Deficiency, Fractures)  Result Value Ref Range   Vit D, 25-Hydroxy 14.5 (L) 30.0 - 100.0 ng/mL      Assessment & Plan:   Problem List Items Addressed This Visit      Unprioritized   Essential hypertension, benign    Not to goal.  Add 12.5 mg of HCTZ to medications      Relevant Medications   hydrochlorothiazide (MICROZIDE) 12.5 MG capsule   Knee pain, right    Reviewed MRI and discussed the results.  Will get back to Orthopedics about a management plan.       Vitamin D deficiency    Add weekly Vitamin D prescription.  Recheck in 3 months          Follow up plan: Return in about 4 weeks (around 01/16/2017).

## 2016-12-19 NOTE — Assessment & Plan Note (Signed)
Not to goal.  Add 12.5 mg of HCTZ to medications

## 2016-12-26 ENCOUNTER — Telehealth: Payer: Self-pay | Admitting: Unknown Physician Specialty

## 2016-12-26 DIAGNOSIS — I10 Essential (primary) hypertension: Secondary | ICD-10-CM

## 2016-12-26 NOTE — Telephone Encounter (Signed)
Copied from CRM [id number]. Topic: Quick Communication - See Telephone Encounter >> Dec 26, 2016 11:57 AM Viviann SpareWhite, Selina wrote: Patient is requesting a refill on the following Rx:  amLODipine (NORVASC) 5 MG tablet  CRM for notification. See Telephone encounter for: 12/26/16.

## 2016-12-29 ENCOUNTER — Encounter (INDEPENDENT_AMBULATORY_CARE_PROVIDER_SITE_OTHER): Payer: Self-pay | Admitting: Orthopaedic Surgery

## 2016-12-29 ENCOUNTER — Ambulatory Visit (INDEPENDENT_AMBULATORY_CARE_PROVIDER_SITE_OTHER): Payer: Commercial Managed Care - PPO | Admitting: Orthopaedic Surgery

## 2016-12-29 DIAGNOSIS — G8929 Other chronic pain: Secondary | ICD-10-CM

## 2016-12-29 DIAGNOSIS — M25561 Pain in right knee: Secondary | ICD-10-CM

## 2016-12-29 DIAGNOSIS — M1711 Unilateral primary osteoarthritis, right knee: Secondary | ICD-10-CM

## 2016-12-29 MED ORDER — AMLODIPINE BESYLATE 5 MG PO TABS: 5.0000 mg | ORAL_TABLET | Freq: Every day | ORAL | 0 refills | Status: DC

## 2016-12-29 NOTE — Progress Notes (Signed)
The patient is here to go over an MRI of her right knee.  She is on chronic pain in that right knee with locking and catching but mainly just pain.  She points the medial joint line source of her pain.  On exam she has varus malalignment of the knee that is easily correctable.  Her extension and flexion are full.  The knee feels ligaments are stable.  She has some patellofemoral crepitation.  She has significant medial joint line tenderness.  MRIs reviewed with her and it does show extensive arthritis in the medial compartment the knee.  There is areas of full-thickness cartilage loss in the medial femoral condyle.  There is reactive edema in the bone as well.  The medial meniscus is macerated due to joint space narrowing.  There is a incomplete tear of the root of the meniscus.  She does have some partial-thickness cartilage at patellofemoral joint and the lateral compartment looks almost normal except for a small area on the lateral plateau.  ACL PCL as well as medial lateral meniscus are all intact.  At this point she would like to try at least a hyaluronic acid injection.  The steroid injection only helped for about 3 days but this is worse certainly trying.  If this works that be great but if not she would be likely a candidate for a partial knee replacement.  I certainly model explained this to her as well.  We see her back at her next visit hopefully will will be placing a hyaluronic acid injection in her right knee and I gave her handout about this as well.

## 2017-01-29 ENCOUNTER — Ambulatory Visit (INDEPENDENT_AMBULATORY_CARE_PROVIDER_SITE_OTHER): Payer: Commercial Managed Care - PPO | Admitting: Orthopaedic Surgery

## 2017-01-30 ENCOUNTER — Ambulatory Visit: Payer: Commercial Managed Care - PPO | Admitting: Unknown Physician Specialty

## 2017-03-10 ENCOUNTER — Other Ambulatory Visit: Payer: Self-pay | Admitting: Unknown Physician Specialty

## 2017-03-15 ENCOUNTER — Other Ambulatory Visit: Payer: Self-pay | Admitting: Unknown Physician Specialty

## 2017-03-15 DIAGNOSIS — I10 Essential (primary) hypertension: Secondary | ICD-10-CM

## 2017-06-09 ENCOUNTER — Other Ambulatory Visit: Payer: Self-pay | Admitting: Unknown Physician Specialty

## 2017-06-09 DIAGNOSIS — I10 Essential (primary) hypertension: Secondary | ICD-10-CM

## 2017-06-10 ENCOUNTER — Other Ambulatory Visit: Payer: Self-pay | Admitting: Unknown Physician Specialty

## 2017-06-23 ENCOUNTER — Telehealth (INDEPENDENT_AMBULATORY_CARE_PROVIDER_SITE_OTHER): Payer: Self-pay

## 2017-06-23 NOTE — Telephone Encounter (Signed)
Patient called wanting to know if she can apply for bilateral knee gel injections.  CB# [phone].  Please advise.  Thank you.

## 2017-06-23 NOTE — Telephone Encounter (Signed)
yes

## 2017-06-24 ENCOUNTER — Telehealth (INDEPENDENT_AMBULATORY_CARE_PROVIDER_SITE_OTHER): Payer: Self-pay

## 2017-06-24 NOTE — Telephone Encounter (Signed)
Noted  

## 2017-06-24 NOTE — Telephone Encounter (Signed)
Submitted application online for Monovisc injection, bilateral knee. 

## 2017-07-03 ENCOUNTER — Other Ambulatory Visit: Payer: Self-pay | Admitting: Unknown Physician Specialty

## 2017-07-03 ENCOUNTER — Telehealth: Payer: Self-pay | Admitting: Unknown Physician Specialty

## 2017-07-03 NOTE — Telephone Encounter (Signed)
Copied from CRM [id number]. Topic: Quick Communication - Rx Refill/Question >> Jul 03, 2017 10:17 AM Crist InfanteHarrald, Kathy J wrote: Medication: hydrochlorothiazide (MICROZIDE) 12.5 MG capsule 90 day Pt informed to cal the pharmacy next time [id number] [place], [place] - [address] [phone] (Phone) [phone] (Fax)

## 2017-07-06 MED ORDER — HYDROCHLOROTHIAZIDE 12.5 MG PO CAPS: 12.5000 mg | ORAL_CAPSULE | Freq: Every day | ORAL | 1 refills | Status: DC

## 2017-07-06 NOTE — Addendum Note (Signed)
Addended by: Gabriel CirriWICKER, Tinzlee Craker on: 07/06/2017 05:23 PM   Modules accepted: Orders

## 2017-07-06 NOTE — Telephone Encounter (Signed)
Patient called, left VM to return call to the office to schedule an appointment before Hydrochlorothiazide refill. Noted the last request on 06/10/17, refused refill-need an appointment.

## 2017-07-06 NOTE — Telephone Encounter (Signed)
Pt. Made appt. For 7/2 could not get in earlier.  She is asking if can refill until then.   Told her if she does not keep appt. On 7/2 no refills would be filled after that date,  IF Jamesetta OrleansWicker can refill this month.

## 2017-07-14 ENCOUNTER — Encounter (INDEPENDENT_AMBULATORY_CARE_PROVIDER_SITE_OTHER): Payer: Self-pay | Admitting: Physician Assistant

## 2017-07-14 ENCOUNTER — Ambulatory Visit (INDEPENDENT_AMBULATORY_CARE_PROVIDER_SITE_OTHER): Payer: Commercial Managed Care - PPO | Admitting: Physician Assistant

## 2017-07-14 ENCOUNTER — Ambulatory Visit (INDEPENDENT_AMBULATORY_CARE_PROVIDER_SITE_OTHER): Payer: Commercial Managed Care - PPO

## 2017-07-14 DIAGNOSIS — M1711 Unilateral primary osteoarthritis, right knee: Secondary | ICD-10-CM

## 2017-07-14 DIAGNOSIS — M1712 Unilateral primary osteoarthritis, left knee: Secondary | ICD-10-CM

## 2017-07-14 DIAGNOSIS — M25562 Pain in left knee: Secondary | ICD-10-CM

## 2017-07-14 MED ORDER — HYALURONAN 88 MG/4ML IX SOSY
88.0000 mg | PREFILLED_SYRINGE | INTRA_ARTICULAR | Status: AC | PRN
  Administered 2017-07-14: 88 mg via INTRA_ARTICULAR

## 2017-07-14 NOTE — Progress Notes (Addendum)
   Procedure Note  Patient: Rhonda HeadingsBarbara J Stanley             Date of Birth: 07/27/1965           MRN: [id number]             Visit Date: 07/14/2017 HPI: [name] comes in today for bilateral Monovisc injections.  We seen her in the past for right knee, right knee MRI showed extensive full-thickness cartilage loss the medial compartment with joint space narrowing.  Also a incomplete radial tear the posterior horn of the medial meniscus.  She states that her left knee is now bothering her.  She is called and ask about getting a Monovisc injection and this was approved.  She is had no injury to the left knee.  No real mechanical symptoms of the left knee.  Pain mainly around the patellar region.  Physical exam bilateral knees: No effusion abnormal warmth erythema of either knee.  Left knee with crepitus but peripatellar region.  Right knee she has tenderness along medial lateral joint line.  Full range of motion bilateral knees.  Radiographs: AP and lateral left knee: No acute fracture.  Moderate medial joint line narrowing.  Mild patellofemoral changes.  No other bony abnormalities knee is well located.  Procedures: Visit Diagnoses: Acute pain of left knee - Plan: XR Knee 1-2 Views Left, Large Joint Inj: bilateral knee  Large Joint Inj: bilateral knee on 07/14/2017 3:48 PM Indications: pain Details: 22 G 1.5 in needle, anterolateral approach  Arthrogram: No  Medications (Right): 88 mg Hyaluronan 88 MG/4ML Medications (Left): 88 mg Hyaluronan 88 MG/4ML Outcome: tolerated well, no immediate complications Procedure, treatment alternatives, risks and benefits explained, specific risks discussed. Consent was given by the patient. Immediately prior to procedure a time out was called to verify the correct patient, procedure, equipment, support staff and site/side marked as required. Patient was prepped and draped in the usual sterile fashion.     Plan: She will continue work on Dance movement psychotherapistquad strengthening.   Discussed with her that she can only have supplemental injections every 6 months and cortisone injection injections every 3.  We will see her back in 8 weeks to check her progress lack of.  She is doing well she can call and cancel the appointment.

## 2017-08-04 ENCOUNTER — Ambulatory Visit: Payer: Commercial Managed Care - PPO | Admitting: Unknown Physician Specialty

## 2017-08-18 ENCOUNTER — Encounter: Payer: Self-pay | Admitting: Unknown Physician Specialty

## 2017-09-11 ENCOUNTER — Ambulatory Visit: Payer: Commercial Managed Care - PPO | Admitting: Unknown Physician Specialty

## 2017-09-14 ENCOUNTER — Ambulatory Visit (INDEPENDENT_AMBULATORY_CARE_PROVIDER_SITE_OTHER): Payer: Commercial Managed Care - PPO | Admitting: Physician Assistant

## 2017-09-16 ENCOUNTER — Other Ambulatory Visit: Payer: Self-pay | Admitting: Unknown Physician Specialty

## 2017-09-16 ENCOUNTER — Ambulatory Visit: Payer: Commercial Managed Care - PPO | Admitting: Physician Assistant

## 2017-09-16 DIAGNOSIS — I10 Essential (primary) hypertension: Secondary | ICD-10-CM

## 2017-09-17 ENCOUNTER — Other Ambulatory Visit: Payer: Self-pay | Admitting: Unknown Physician Specialty

## 2017-09-17 DIAGNOSIS — I10 Essential (primary) hypertension: Secondary | ICD-10-CM

## 2017-09-25 ENCOUNTER — Ambulatory Visit: Payer: Self-pay | Admitting: Family Medicine

## 2017-10-14 ENCOUNTER — Other Ambulatory Visit: Payer: Self-pay | Admitting: Physician Assistant

## 2017-10-14 DIAGNOSIS — I10 Essential (primary) hypertension: Secondary | ICD-10-CM

## 2017-10-14 NOTE — Telephone Encounter (Signed)
Last OV: 12/19/16 Next OV: 10/19/17  Lab Results  Component Value Date   CHOL 153 09/12/2016   HDL 48 09/12/2016   LDLCALC 91 09/12/2016   TRIG 72 09/12/2016   Lab Results  Component Value Date   CREATININE 0.70 09/12/2016   BUN 15 09/12/2016   NA 143 09/12/2016   K 4.2 09/12/2016   CL 103 09/12/2016   CO2 24 09/12/2016     BMP Latest Ref Rng & Units 09/12/2016 08/28/2015 05/28/2015  Glucose 65 - 99 mg/dL 85 97 937(J)  BUN 6 - 24 mg/dL 15 19 16   Creatinine 0.57 - 1.00 mg/dL 6.96 7.89 3.81  BUN/Creat Ratio 9 - 23 21 27(H) -  Sodium 134 - 144 mmol/L 143 141 140  Potassium 3.5 - 5.2 mmol/L 4.2 4.7 4.3  Chloride 96 - 106 mmol/L 103 100 105  CO2 20 - 29 mmol/L 24 27 29   Calcium 8.7 - 10.2 mg/dL 9.7 01.7 9.5

## 2017-10-19 ENCOUNTER — Ambulatory Visit: Payer: Commercial Managed Care - PPO | Admitting: Family Medicine

## 2017-10-19 ENCOUNTER — Encounter: Payer: Self-pay | Admitting: Family Medicine

## 2017-10-19 VITALS — BP 124/83 | HR 61 | Temp 98.3°F | Ht 63.0 in | Wt 198.5 lb

## 2017-10-19 DIAGNOSIS — E7800 Pure hypercholesterolemia, unspecified: Secondary | ICD-10-CM | POA: Diagnosis not present

## 2017-10-19 DIAGNOSIS — M25562 Pain in left knee: Secondary | ICD-10-CM

## 2017-10-19 DIAGNOSIS — I10 Essential (primary) hypertension: Secondary | ICD-10-CM | POA: Diagnosis not present

## 2017-10-19 DIAGNOSIS — M25561 Pain in right knee: Secondary | ICD-10-CM

## 2017-10-19 DIAGNOSIS — G8929 Other chronic pain: Secondary | ICD-10-CM

## 2017-10-19 DIAGNOSIS — E559 Vitamin D deficiency, unspecified: Secondary | ICD-10-CM | POA: Diagnosis not present

## 2017-10-19 DIAGNOSIS — E6609 Other obesity due to excess calories: Secondary | ICD-10-CM

## 2017-10-19 DIAGNOSIS — Z6835 Body mass index (BMI) 35.0-35.9, adult: Secondary | ICD-10-CM

## 2017-10-19 MED ORDER — VITAMIN D (ERGOCALCIFEROL) 1.25 MG (50000 UNIT) PO CAPS: 50000.0000 [IU] | ORAL_CAPSULE | ORAL | 0 refills | Status: DC

## 2017-10-19 MED ORDER — AMLODIPINE BESYLATE 5 MG PO TABS: 5.0000 mg | ORAL_TABLET | Freq: Every day | ORAL | 1 refills | Status: DC

## 2017-10-19 MED ORDER — BUPROPION HCL ER (XL) 150 MG PO TB24: 150.0000 mg | ORAL_TABLET | Freq: Every day | ORAL | 2 refills | Status: DC

## 2017-10-19 MED ORDER — ATENOLOL 50 MG PO TABS: 50.0000 mg | ORAL_TABLET | Freq: Every day | ORAL | 1 refills | Status: DC

## 2017-10-19 NOTE — Progress Notes (Signed)
BP 124/83 (BP Location: Left Arm, Patient Position: Sitting, Cuff Size: Normal)   Pulse 61   Temp 98.3 F (36.8 C) (Oral)   Ht 5\' 3"  (1.6 m)   Wt 198 lb 8 oz (90 kg)   SpO2 97%   BMI 35.16 kg/m    Subjective:    Patient ID: Rhonda Stanley, female    DOB: 1965-10-01, 52 y.o.   MRN: [id number]  HPI: TYE Stanley is a 52 y.o. female  Chief Complaint  Patient presents with  . Medication Management  . Medication Refill    Refill on Atorvastatin and Atenolol    Here today for 6 month f/u. BPs WNL when checked at home, taking medications regularly without side effects. Denies CP, SOB, HAs, dizziness. Taking lipitor for HLD without issue. No myalgias.   B/l knee pain persisting, working with orthopedics currently and doing injections without relief. Feels like weight loss would help but that she can't exercise because her knees hurt too much. Would like to discuss weight loss medication. Does not follow any particular diet and does drink sodas and teas daily.   Has not been taking her vit d supplement in months - forgot about it.   Relevant past medical, surgical, family and social history reviewed and updated as indicated. Interim medical history since our last visit reviewed. Allergies and medications reviewed and updated.  Review of Systems  Per HPI unless specifically indicated above     Objective:    BP 124/83 (BP Location: Left Arm, Patient Position: Sitting, Cuff Size: Normal)   Pulse 61   Temp 98.3 F (36.8 C) (Oral)   Ht 5\' 3"  (1.6 m)   Wt 198 lb 8 oz (90 kg)   SpO2 97%   BMI 35.16 kg/m   Wt Readings from Last 3 Encounters:  10/19/17 198 lb 8 oz (90 kg)  12/19/16 208 lb (94.3 kg)  09/12/16 212 lb 6.4 oz (96.3 kg)    Physical Exam  Constitutional: She is oriented to person, place, and time. She appears well-developed and well-nourished.  HENT:  Head: Atraumatic.  Eyes: Conjunctivae and EOM are normal.  Neck: Normal range of motion. Neck supple.    Cardiovascular: Normal rate and regular rhythm.  Pulmonary/Chest: Effort normal and breath sounds normal.  Musculoskeletal: Normal range of motion. She exhibits no deformity.  Neurological: She is alert and oriented to person, place, and time.  Skin: Skin is warm and dry.  Psychiatric: She has a normal mood and affect. Her behavior is normal. Thought content normal.  Nursing note and vitals reviewed.   Results for orders placed or performed in visit on 10/19/17  Comprehensive metabolic panel  Result Value Ref Range   Glucose 84 65 - 99 mg/dL   BUN 13 6 - 24 mg/dL   Creatinine, Ser 1.91 0.57 - 1.00 mg/dL   GFR calc non Af Amer 93 >59 mL/min/1.73   GFR calc Af Amer 107 >59 mL/min/1.73   BUN/Creatinine Ratio 17 9 - 23   Sodium 142 134 - 144 mmol/L   Potassium 4.0 3.5 - 5.2 mmol/L   Chloride 99 96 - 106 mmol/L   CO2 27 20 - 29 mmol/L   Calcium 9.6 8.7 - 10.2 mg/dL   Total Protein 7.2 6.0 - 8.5 g/dL   Albumin 4.2 3.5 - 5.5 g/dL   Globulin, Total 3.0 1.5 - 4.5 g/dL   Albumin/Globulin Ratio 1.4 1.2 - 2.2   Bilirubin Total 0.7 0.0 - 1.2 mg/dL  Alkaline Phosphatase 106 39 - 117 IU/L   AST 18 0 - 40 IU/L   ALT 20 0 - 32 IU/L  Lipid Panel w/o Chol/HDL Ratio  Result Value Ref Range   Cholesterol, Total 189 100 - 199 mg/dL   Triglycerides 90 0 - 149 mg/dL   HDL 53 >16>39 mg/dL   VLDL Cholesterol Cal 18 5 - 40 mg/dL   LDL Calculated 109118 (H) 0 - 99 mg/dL  Vitamin D (25 hydroxy)  Result Value Ref Range   Vit D, 25-Hydroxy 16.9 (L) 30.0 - 100.0 ng/mL      Assessment & Plan:   Problem List Items Addressed This Visit      Cardiovascular and Mediastinum   Essential hypertension, benign    Stable and WNL, continue current regimen      Relevant Medications   irbesartan-hydrochlorothiazide (AVALIDE) 300-12.5 MG tablet   amLODipine (NORVASC) 5 MG tablet   atenolol (TENORMIN) 50 MG tablet   Other Relevant Orders   Comprehensive metabolic panel (Completed)     Other    Hypercholesterolemia - Primary    Recheck lipids, continue current regimen      Relevant Medications   irbesartan-hydrochlorothiazide (AVALIDE) 300-12.5 MG tablet   amLODipine (NORVASC) 5 MG tablet   atenolol (TENORMIN) 50 MG tablet   Other Relevant Orders   Lipid Panel w/o Chol/HDL Ratio (Completed)   Obesity    Long discussion about medication options and dietician referral. Declines the referral, open to trying wellbutrin along with lifestyle modifications. Will continue to monitor. Lifestyle habits reviewed      Vitamin D deficiency    Will restart vitamin D supplementation and recheck levels today for baseline. Discussed increased dietary intake      Relevant Orders   Vitamin D (25 hydroxy) (Completed)    Other Visit Diagnoses    Chronic pain of both knees       Working with orthopedics on this. Low impact exercises and weight loss reviewed   Relevant Medications   buPROPion (WELLBUTRIN XL) 150 MG 24 hr tablet       Follow up plan: Return in about 3 months (around 01/18/2018) for Weight check, vit d with Jolene.

## 2017-10-20 ENCOUNTER — Encounter: Payer: Self-pay | Admitting: Family Medicine

## 2017-10-20 LAB — VITAMIN D 25 HYDROXY (VIT D DEFICIENCY, FRACTURES): Vit D, 25-Hydroxy: 16.9 ng/mL — ABNORMAL LOW (ref 30.0–100.0)

## 2017-10-20 LAB — COMPREHENSIVE METABOLIC PANEL
ALBUMIN: 4.2 g/dL (ref 3.5–5.5)
ALK PHOS: 106 IU/L (ref 39–117)
ALT: 20 IU/L (ref 0–32)
AST: 18 IU/L (ref 0–40)
Albumin/Globulin Ratio: 1.4 (ref 1.2–2.2)
BUN / CREAT RATIO: 17 (ref 9–23)
BUN: 13 mg/dL (ref 6–24)
Bilirubin Total: 0.7 mg/dL (ref 0.0–1.2)
CHLORIDE: 99 mmol/L (ref 96–106)
CO2: 27 mmol/L (ref 20–29)
Calcium: 9.6 mg/dL (ref 8.7–10.2)
Creatinine, Ser: 0.75 mg/dL (ref 0.57–1.00)
GFR calc Af Amer: 107 mL/min/{1.73_m2} (ref >59)
GFR calc non Af Amer: 93 mL/min/{1.73_m2} (ref >59)
GLUCOSE: 84 mg/dL (ref 65–99)
Globulin, Total: 3 g/dL (ref 1.5–4.5)
Potassium: 4 mmol/L (ref 3.5–5.2)
Sodium: 142 mmol/L (ref 134–144)
Total Protein: 7.2 g/dL (ref 6.0–8.5)

## 2017-10-20 LAB — LIPID PANEL W/O CHOL/HDL RATIO
CHOLESTEROL TOTAL: 189 mg/dL (ref 100–199)
HDL: 53 mg/dL (ref >39)
LDL Calculated: 118 mg/dL — ABNORMAL HIGH (ref 0–99)
Triglycerides: 90 mg/dL (ref 0–149)
VLDL CHOLESTEROL CAL: 18 mg/dL (ref 5–40)

## 2017-10-22 NOTE — Assessment & Plan Note (Signed)
Stable and WNL, continue current regimen 

## 2017-10-22 NOTE — Assessment & Plan Note (Signed)
Long discussion about medication options and dietician referral. Declines the referral, open to trying wellbutrin along with lifestyle modifications. Will continue to monitor. Lifestyle habits reviewed

## 2017-10-22 NOTE — Assessment & Plan Note (Signed)
Will restart vitamin D supplementation and recheck levels today for baseline. Discussed increased dietary intake

## 2017-10-22 NOTE — Assessment & Plan Note (Signed)
Recheck lipids, continue current regimen 

## 2017-11-02 ENCOUNTER — Other Ambulatory Visit: Payer: Self-pay | Admitting: Family Medicine

## 2017-11-02 NOTE — Telephone Encounter (Signed)
Atorvastatin (Lipitor) refill Last Refill:10/14/17 # 15 tabs  Last OV: 10/19/17 PCP: Gabriel Cirri NP [facility] [address]  Pt has appt with Aura Dials  DNP 01/25/18

## 2017-11-03 NOTE — Telephone Encounter (Signed)
Pt calling to follow up on refill request.  Pt states she has been out 3 days. atorvastatin (LIPITOR) 10 MG tablet   [id number] [place], [place] - [address]

## 2017-11-11 ENCOUNTER — Other Ambulatory Visit: Payer: Self-pay | Admitting: Family Medicine

## 2017-12-08 ENCOUNTER — Other Ambulatory Visit: Payer: Self-pay | Admitting: Family Medicine

## 2017-12-08 NOTE — Telephone Encounter (Signed)
Requested Prescriptions  Pending Prescriptions Disp Refills  . buPROPion (WELLBUTRIN XL) 150 MG 24 hr tablet [Pharmacy Med Name: BUPROPION HCL XL 150 MG TABLET] 90 tablet 0    Sig: TAKE 1 TABLET BY MOUTH EVERY DAY     Psychiatry: Antidepressants - bupropion Passed - 12/08/2017 10:31 AM      Passed - Last BP in normal range    BP Readings from Last 1 Encounters:  10/19/17 124/83         Passed - Valid encounter within last 6 months    Recent Outpatient Visits          1 month ago Hypercholesterolemia   [facility] Part[facility] [place], [place]   11 months ago Chronic pain of right knee   [facility] Gabriel Cirri, NP   1 year ago Need for tetanus, diphtheria, and acellular pertussis (Tdap) vaccine   [facility] Gabriel Cirri, NP   1 year ago Essential hypertension, benign   [facility] Gabriel Cirri, NP   1 year ago Upper respiratory tract infection, unspecified type   [facility], Salley Hews, PA-C      Future Appointments            In 1 month Cannady, Dorie Rank, NP [facility]

## 2017-12-22 ENCOUNTER — Other Ambulatory Visit: Payer: Self-pay | Admitting: Family Medicine

## 2017-12-22 NOTE — Telephone Encounter (Signed)
Requested medication (s) are due for refill today: yes  Requested medication (s) are on the active medication list: yes  Last refill:  Last refilled by historical provider  Future visit scheduled: yes  Notes to clinic:  Unable to refill per protocol due to medication being previously filled by historical provider    Requested Prescriptions  Pending Prescriptions Disp Refills   irbesartan-hydrochlorothiazide (AVALIDE) 300-12.5 MG tablet [Pharmacy Med Name: IRBESARTAN-HCTZ 300-12.5 MG TB] 90 tablet 2    Sig: TAKE 1 TABLET BY MOUTH EVERY DAY     Cardiovascular: ARB + Diuretic Combos Passed - 12/22/2017  1:38 AM      Passed - K in normal range and within 180 days    Potassium  Date Value Ref Range Status  10/19/2017 4.0 3.5 - 5.2 mmol/L Final         Passed - Na in normal range and within 180 days    Sodium  Date Value Ref Range Status  10/19/2017 142 134 - 144 mmol/L Final         Passed - Cr in normal range and within 180 days    Creatinine, Ser  Date Value Ref Range Status  10/19/2017 0.75 0.57 - 1.00 mg/dL Final         Passed - Ca in normal range and within 180 days    Calcium  Date Value Ref Range Status  10/19/2017 9.6 8.7 - 10.2 mg/dL Final         Passed - Patient is not pregnant      Passed - Last BP in normal range    BP Readings from Last 1 Encounters:  10/19/17 124/83         Passed - Valid encounter within last 6 months    Recent Outpatient Visits          2 months ago [facility]cholesterolemia   [facility] Rachel Elizabeth, PA-C   1 year ago Chronic pain of right knee   [facility] Gabriel CirriWicker, Cheryl, NP   1 year ago Need for tetanus, diphtheria, and acellular pertussis (Tdap) vaccine   [facility] Gabriel CirriWicker, Cheryl, NP   1 year ago Essential hypertension, benign   [facility] Gabriel CirriWicker, Cheryl, NP   1 year ago Upper respiratory tract infection, unspecified type   [facility] [address], Salley Hewsachel  Elizabeth, PA-C      Future Appointments            In 1 month Cannady, Dorie RankJolene T, NP [facility]

## 2018-01-02 ENCOUNTER — Other Ambulatory Visit: Payer: Self-pay | Admitting: Unknown Physician Specialty

## 2018-01-25 ENCOUNTER — Ambulatory Visit: Payer: Commercial Managed Care - PPO | Admitting: Nurse Practitioner

## 2018-02-22 ENCOUNTER — Ambulatory Visit (INDEPENDENT_AMBULATORY_CARE_PROVIDER_SITE_OTHER): Payer: Commercial Managed Care - PPO | Admitting: Physician Assistant

## 2018-02-22 ENCOUNTER — Encounter (INDEPENDENT_AMBULATORY_CARE_PROVIDER_SITE_OTHER): Payer: Self-pay | Admitting: Physician Assistant

## 2018-02-22 VITALS — Ht 63.0 in | Wt 198.5 lb

## 2018-02-22 DIAGNOSIS — M1712 Unilateral primary osteoarthritis, left knee: Secondary | ICD-10-CM | POA: Diagnosis not present

## 2018-02-22 MED ORDER — METHYLPREDNISOLONE ACETATE 40 MG/ML IJ SUSP
40.0000 mg | INTRAMUSCULAR | Status: AC | PRN
  Administered 2018-02-22: 40 mg via INTRA_ARTICULAR

## 2018-02-22 MED ORDER — NAPROXEN 500 MG PO TABS: 500.0000 mg | ORAL_TABLET | Freq: Two times a day (BID) | ORAL | 1 refills | Status: DC

## 2018-02-22 MED ORDER — LIDOCAINE HCL 1 % IJ SOLN
5.0000 mL | INTRAMUSCULAR | Status: AC | PRN
  Administered 2018-02-22: 5 mL

## 2018-02-22 NOTE — Progress Notes (Signed)
Office Visit Note   Patient: Rhonda HeadingsBarbara J Decoster           Date of Birth: 07/30/1965           MRN: [id number] Visit Date: 02/22/2018              Requested by: Gabriel CirriWicker, Cheryl, NP [address] [place], [place] [id number] PCP: Gabriel CirriWicker, Cheryl, NP   Assessment & Plan: Visit Diagnoses:  1. Unilateral primary osteoarthritis, left knee     Plan:  We will see her back in 2 weeks to see her response to the left knee aspiration injection.  She is also to begin taking naproxen 500 mg twice daily and a prescription is sent in for today.  Questions encouraged and answered.  Follow-Up Instructions: Return in about 2 weeks (around 03/08/2018).   Orders:  Orders Placed This Encounter  Procedures  . Large Joint Inj   No orders of the defined types were placed in this encounter.     Procedures: Large Joint Inj on 02/22/2018 3:46 PM Indications: pain Details: 22 G 1.5 in needle, anterolateral approach  Arthrogram: No  Medications: 40 mg methylPREDNISolone acetate 40 MG/ML; 5 mL lidocaine 1 % Aspirate: 32 mL yellow Outcome: tolerated well, no immediate complications Procedure, treatment alternatives, risks and benefits explained, specific risks discussed. Consent was given by the patient. Immediately prior to procedure a time out was called to verify the correct patient, procedure, equipment, support staff and site/side marked as required. Patient was prepped and draped in the usual sterile fashion.       Clinical Data: No additional findings.   Subjective: Chief Complaint  Patient presents with  . Left Knee - Pain, Follow-up  . Right Knee - Follow-up    HPI Rhonda Stanley comes in today with left greater than right knee pain.  She has known osteoarthritis both knees with history of a right knee MRI that showed extensive arthritis in the medial compartment.  She underwent supplemental injections both knees back in June and states that these really did not help.  Cortisone injections have not  given her significant relief in the past.  She had no known injury to either knee. Review of Systems Please see HPI otherwise negative  Objective: Vital Signs: Ht 5\' 3"  (1.6 m)   Wt 198 lb 8 oz (90 kg)   BMI 35.16 kg/m   Physical Exam General: Well-developed well-nourished female no acute distress.  Mood and affect appropriate.  Psych alert oriented x3. Ortho Exam Bilateral knees good range of motion with patellofemoral crepitus bilaterally.  Tenderness medial lateral joint line of the left knee medial joint line of the right knee.  No instability valgus varus stressing.  Effusion left knee no effusion right knee.  No abnormal warmth erythema of either knee. Specialty Comments:  No specialty comments available.  Imaging: No results found.   PMFS History: Patient Active Problem List   Diagnosis Date Noted  . Unilateral primary osteoarthritis, right knee 12/29/2016  . Vitamin D deficiency 12/19/2016  . Knee pain, right 09/12/2016  . Cutaneous skin tags 09/25/2015  . Obesity 08/28/2015  . Essential hypertension, benign 07/13/2014  . Hypercholesterolemia 07/13/2014   Past Medical History:  Diagnosis Date  . Hypertension     Family History  Problem Relation Age of Onset  . Hypertension Mother   . Hypertension Father     Past Surgical History:  Procedure Laterality Date  . SHOULDER SURGERY Right 2011   Social History   Occupational History  .  Occupation: [employer]  Tobacco Use  . Smoking status: Never Smoker  . Smokeless tobacco: Never Used  Substance and Sexual Activity  . Alcohol use: Yes    Alcohol/week: 3.0 standard drinks    Types: 3 Standard drinks or equivalent per week    Comment: on occasion  . Drug use: No  . Sexual activity: Yes    Partners: Male    Birth control/protection: I.U.D.

## 2018-03-08 ENCOUNTER — Ambulatory Visit (INDEPENDENT_AMBULATORY_CARE_PROVIDER_SITE_OTHER): Payer: Commercial Managed Care - PPO | Admitting: Orthopaedic Surgery

## 2018-03-15 ENCOUNTER — Ambulatory Visit (INDEPENDENT_AMBULATORY_CARE_PROVIDER_SITE_OTHER): Payer: Commercial Managed Care - PPO | Admitting: Orthopaedic Surgery

## 2018-03-15 ENCOUNTER — Other Ambulatory Visit (INDEPENDENT_AMBULATORY_CARE_PROVIDER_SITE_OTHER): Payer: Self-pay

## 2018-03-15 ENCOUNTER — Encounter (INDEPENDENT_AMBULATORY_CARE_PROVIDER_SITE_OTHER): Payer: Self-pay | Admitting: Orthopaedic Surgery

## 2018-03-15 DIAGNOSIS — G8929 Other chronic pain: Secondary | ICD-10-CM

## 2018-03-15 DIAGNOSIS — M25562 Pain in left knee: Secondary | ICD-10-CM

## 2018-03-15 NOTE — Progress Notes (Signed)
The patient is continuing to experience debilitating left knee pain with a popping sensation as well as locking and catching.  She is feeling like the knee is coming out of joint.  We have tried and failed all forms conservative treatment including activity modification and rest.  She has had steroid injections and quad strengthening exercises as well as taking anti-inflammatories.  She does have known significant osteoarthritis in her right knee which is been seen for several years now for someone who is only 53 years old.  On examination of her left knee she has significant pain of the lateral joint line and the lateral IT band area.  It is very tender to her.  Her McMurray's is positive to the lateral compartment.  At this point an MRI is warranted based on the fact the plain films of her left knee under a year ago still showed well-maintained joint space.  Given the fact that she is having mechanical symptoms and symptoms of instability and given the fact that she is failed all forms conservative treatment and MRI is warranted at this point to assess her left knee is cartilage and assess for anything that is leading to these mechanical symptoms.

## 2018-03-23 ENCOUNTER — Ambulatory Visit (INDEPENDENT_AMBULATORY_CARE_PROVIDER_SITE_OTHER): Payer: Commercial Managed Care - PPO | Admitting: Unknown Physician Specialty

## 2018-03-23 ENCOUNTER — Telehealth (INDEPENDENT_AMBULATORY_CARE_PROVIDER_SITE_OTHER): Payer: Self-pay | Admitting: Orthopaedic Surgery

## 2018-03-23 ENCOUNTER — Encounter: Payer: Self-pay | Admitting: Unknown Physician Specialty

## 2018-03-23 VITALS — BP 167/112 | HR 65 | Temp 98.4°F | Wt 208.9 lb

## 2018-03-23 DIAGNOSIS — I10 Essential (primary) hypertension: Secondary | ICD-10-CM

## 2018-03-23 DIAGNOSIS — L02416 Cutaneous abscess of left lower limb: Secondary | ICD-10-CM | POA: Diagnosis not present

## 2018-03-23 MED ORDER — AMLODIPINE BESYLATE 5 MG PO TABS: 5.0000 mg | ORAL_TABLET | Freq: Every day | ORAL | 1 refills | Status: DC

## 2018-03-23 NOTE — Telephone Encounter (Signed)
Patient stated at last appt was told she was to get MRI. There is no referral in for one. Patient had made a 2 week return visit for review.

## 2018-03-23 NOTE — Assessment & Plan Note (Addendum)
BP is high today.  Will check at home.  States it has been good.  Will RTC if continues high

## 2018-03-23 NOTE — Telephone Encounter (Signed)
This was put in on the 10th, accidentally under his brother can you still see the order?

## 2018-03-23 NOTE — Progress Notes (Signed)
BP (!) 167/112   Pulse 65   Temp 98.4 F (36.9 C) (Oral)   Wt 208 lb 14.4 oz (94.8 kg)   SpO2 97%   BMI 37.00 kg/m    Subjective:    Patient ID: Rhonda Stanley, female    DOB: Jul 31, 1965, 53 y.o.   MRN: [id number]  HPI: Rhonda Stanley is a 53 y.o. female  Chief Complaint  Patient presents with  . Recurrent Skin Infections    inside of left thigh, no drainage and not very painful. States it has been there for about a week    Pt with a "boil" inside of left thigh.  Pt states it has been there for 1 week and using warm packs.  She states she "wants it gone."  No fever.  No real pain  BP is high today but states "it's been good."  Thinks she gets worried about this  Relevant past medical, surgical, family and social history reviewed and updated as indicated. Interim medical history since our last visit reviewed. Allergies and medications reviewed and updated.  Review of Systems  Per HPI unless specifically indicated above     Objective:    BP (!) 167/112   Pulse 65   Temp 98.4 F (36.9 C) (Oral)   Wt 208 lb 14.4 oz (94.8 kg)   SpO2 97%   BMI 37.00 kg/m   Wt Readings from Last 3 Encounters:  03/23/18 208 lb 14.4 oz (94.8 kg)  02/22/18 198 lb 8 oz (90 kg)  10/19/17 198 lb 8 oz (90 kg)    Physical Exam Constitutional:      General: She is not in acute distress.    Appearance: Normal appearance. She is well-developed.  HENT:     Head: Normocephalic and atraumatic.  Eyes:     General: Lids are normal. No scleral icterus.       Right eye: No discharge.        Left eye: No discharge.     Conjunctiva/sclera: Conjunctivae normal.  Cardiovascular:     Rate and Rhythm: Normal rate.  Pulmonary:     Effort: Pulmonary effort is normal.  Abdominal:     Palpations: There is no hepatomegaly or splenomegaly.  Musculoskeletal: Normal range of motion.  Skin:    Coloration: Skin is not pale.     Findings: No rash.     Comments: Pt with cyst on inside of left thigh.   After using a betadine and alcohol preparation, area infiltrated with 1% Lidocaine with epinephrine.  Using a #11 blade, lesion lanced and small 1-3 mm incision made.  Expressed a small amount of purulent drainage.     Neurological:     Mental Status: She is alert and oriented to person, place, and time.  Psychiatric:        Behavior: Behavior normal.        Thought Content: Thought content normal.        Judgment: Judgment normal.     Results for orders placed or performed in visit on 10/19/17  Comprehensive metabolic panel  Result Value Ref Range   Glucose 84 65 - 99 mg/dL   BUN 13 6 - 24 mg/dL   Creatinine, Ser 7.62 0.57 - 1.00 mg/dL   GFR calc non Af Amer 93 >59 mL/min/1.73   GFR calc Af Amer 107 >59 mL/min/1.73   BUN/Creatinine Ratio 17 9 - 23   Sodium 142 134 - 144 mmol/L   Potassium 4.0 3.5 -  5.2 mmol/L   Chloride 99 96 - 106 mmol/L   CO2 27 20 - 29 mmol/L   Calcium 9.6 8.7 - 10.2 mg/dL   Total Protein 7.2 6.0 - 8.5 g/dL   Albumin 4.2 3.5 - 5.5 g/dL   Globulin, Total 3.0 1.5 - 4.5 g/dL   Albumin/Globulin Ratio 1.4 1.2 - 2.2   Bilirubin Total 0.7 0.0 - 1.2 mg/dL   Alkaline Phosphatase 106 39 - 117 IU/L   AST 18 0 - 40 IU/L   ALT 20 0 - 32 IU/L  Lipid Panel w/o Chol/HDL Ratio  Result Value Ref Range   Cholesterol, Total 189 100 - 199 mg/dL   Triglycerides 90 0 - 149 mg/dL   HDL 53 >01 mg/dL   VLDL Cholesterol Cal 18 5 - 40 mg/dL   LDL Calculated 601 (H) 0 - 99 mg/dL  Vitamin D (25 hydroxy)  Result Value Ref Range   Vit D, 25-Hydroxy 16.9 (L) 30.0 - 100.0 ng/mL      Assessment & Plan:   Problem List Items Addressed This Visit      Unprioritized   Essential hypertension, benign    BP is high today.  Will check at home.  States it has been good.  Will RTC if continues high      Relevant Medications   amLODipine (NORVASC) 5 MG tablet    Other Visit Diagnoses    Abscess of left leg    -  Primary   Wil do I&D as pt wants it gone.  Discussed to continue with  warm soaks and bandage change.  RTC worsening symptoms       Follow up plan: Return if symptoms worsen or fail to improve.

## 2018-03-23 NOTE — Telephone Encounter (Signed)
I took care of it, I changed the name.

## 2018-03-29 ENCOUNTER — Ambulatory Visit (INDEPENDENT_AMBULATORY_CARE_PROVIDER_SITE_OTHER): Payer: Commercial Managed Care - PPO | Admitting: Orthopaedic Surgery

## 2018-04-04 ENCOUNTER — Other Ambulatory Visit: Payer: Self-pay | Admitting: Unknown Physician Specialty

## 2018-04-05 NOTE — Telephone Encounter (Signed)
Requested medication (s) are due for refill today: yes  Requested medication (s) are on the active medication list: yes  Last refill:  10/19/17  Future visit scheduled: no  Notes to clinic:  Vitamin D supplementation failed.  Requested Prescriptions  Pending Prescriptions Disp Refills   Vitamin D, Ergocalciferol, (DRISDOL) 1.25 MG (50000 UT) CAPS capsule [Pharmacy Med Name: VITAMIN D2 1.25MG (50,000 UNIT)] 12 capsule 0    Sig: TAKE 1 CAPSULE (50,000 UNITS TOTAL) EVERY 7 (SEVEN) DAYS BY MOUTH.     Endocrinology:  Vitamins - Vitamin D Supplementation Failed - 04/04/2018  9:08 AM      Failed - 50,000 IU strengths are not delegated      Failed - Phosphate in normal range and within 360 days    No results found for: PHOS       Failed - Vitamin D in normal range and within 360 days    Vit D, 25-Hydroxy  Date Value Ref Range Status  10/19/2017 16.9 (L) 30.0 - 100.0 ng/mL Final    Comment:    Vitamin D deficiency has been defined by the Institute of Medicine and an Endocrine Society practice guideline as a level of serum 25-OH vitamin D less than 20 ng/mL (1,2). The Endocrine Society went on to further define vitamin D insufficiency as a level between 21 and 29 ng/mL (2). 1. IOM (Institute of Medicine). 2010. Dietary reference    intakes for calcium and D. [place]: The    [employer]. 2. Holick MF, Binkley [place], Bischoff-Ferrari HA, et al.    Evaluation, treatment, and prevention of vitamin D    deficiency: an Endocrine Society clinical practice    guideline. JCEM. 2011 Jul; 96(7):1911-30.          Passed - Ca in normal range and within 360 days    Calcium  Date Value Ref Range Status  10/19/2017 9.6 8.7 - 10.2 mg/dL Final         Passed - Valid encounter within last 12 months    Recent Outpatient Visits          1 week ago Abscess of left leg   [facility] Gabriel Cirri, NP   5 months ago Hypercholesterolemia   [facility] Particia Nearing, [place]   1 year ago Chronic pain of right knee   [facility] Gabriel Cirri, NP   1 year ago Need for tetanus, diphtheria, and acellular pertussis (Tdap) vaccine   [facility] Gabriel Cirri, NP   1 year ago Essential hypertension, benign   [facility] Gabriel Cirri, NP

## 2018-04-05 NOTE — Telephone Encounter (Signed)
Refill approved, will need visit for next refill to obtain Vit D level.

## 2018-04-23 ENCOUNTER — Other Ambulatory Visit (INDEPENDENT_AMBULATORY_CARE_PROVIDER_SITE_OTHER): Payer: Self-pay | Admitting: Physician Assistant

## 2018-04-29 ENCOUNTER — Other Ambulatory Visit: Payer: Self-pay | Admitting: Family Medicine

## 2018-04-29 DIAGNOSIS — I10 Essential (primary) hypertension: Secondary | ICD-10-CM

## 2018-04-29 NOTE — Telephone Encounter (Signed)
Requested medication (s) are due for refill today: Yes  Requested medication (s) are on the active medication list: Yes  Last refill:  10/19/17  Future visit scheduled: No  Notes to clinic:  See request    Requested Prescriptions  Pending Prescriptions Disp Refills   atenolol (TENORMIN) 50 MG tablet [Pharmacy Med Name: ATENOLOL 50 MG TABLET] 90 tablet 1    Sig: TAKE 1 TABLET BY MOUTH EVERY DAY     Cardiovascular:  Beta Blockers Failed - 04/29/2018  3:47 AM      Failed - Last BP in normal range    BP Readings from Last 1 Encounters:  03/23/18 (!) 167/112         Passed - Last Heart Rate in normal range    Pulse Readings from Last 1 Encounters:  03/23/18 65         Passed - Valid encounter within last 6 months    Recent Outpatient Visits          1 month ago Abscess of left leg   [facility] Gabriel Cirri, NP   6 months ago Hypercholesterolemia   [facility] Particia Nearing, [place]   1 year ago Chronic pain of right knee   [facility] Gabriel Cirri, NP   1 year ago Need for tetanus, diphtheria, and acellular pertussis (Tdap) vaccine   [facility] Gabriel Cirri, NP   1 year ago Essential hypertension, benign   [facility] Gabriel Cirri, NP

## 2018-04-29 NOTE — Telephone Encounter (Signed)
Will refill 1 month but due for follow up - can do video visit if she has a BP cuff at home

## 2018-05-10 ENCOUNTER — Other Ambulatory Visit: Payer: Self-pay | Admitting: Family Medicine

## 2018-05-10 NOTE — Telephone Encounter (Signed)
Requested medication (s) are due for refill today: yes  Requested medication (s) are on the active medication list: yes  Last refill:  11/03/17  Future visit scheduled: no  Notes to clinic:  Cardiovascular: anti lipid - statins failed.  Requested Prescriptions  Pending Prescriptions Disp Refills   atorvastatin (LIPITOR) 10 MG tablet [Pharmacy Med Name: ATORVASTATIN 10 MG TABLET] 90 tablet 1    Sig: TAKE 1 TABLET BY MOUTH EVERY DAY     Cardiovascular:  Antilipid - Statins Failed - 05/10/2018  1:23 AM      Failed - LDL in normal range and within 360 days    LDL Calculated  Date Value Ref Range Status  10/19/2017 118 (H) 0 - 99 mg/dL Final         Passed - Total Cholesterol in normal range and within 360 days    Cholesterol, Total  Date Value Ref Range Status  10/19/2017 189 100 - 199 mg/dL Final   Cholesterol Piccolo, Waived  Date Value Ref Range Status  10/13/2014 170 <200 mg/dL Final    Comment:                            Desirable                <200                         Borderline High      200- 239                         High                     >239          Passed - HDL in normal range and within 360 days    HDL  Date Value Ref Range Status  10/19/2017 53 >39 mg/dL Final         Passed - Triglycerides in normal range and within 360 days    Triglycerides  Date Value Ref Range Status  10/19/2017 90 0 - 149 mg/dL Final   Triglycerides Piccolo,Waived  Date Value Ref Range Status  10/13/2014 81 <150 mg/dL Final    Comment:                            Normal                   <150                         Borderline High     150 - 199                         High                200 - 499                         Very High                >499          Passed - Patient is not pregnant      Passed - Valid encounter within last 12 months    Recent Outpatient Visits  1 month ago Abscess of left leg   [facility] Gabriel Cirri, NP   6  months ago Hypercholesterolemia   [facility] [place], [place]   1 year ago Chronic pain of right knee   [facility] Gabriel Cirri, NP   1 year ago Need for tetanus, diphtheria, and acellular pertussis (Tdap) vaccine   [facility] Gabriel Cirri, NP   1 year ago Essential hypertension, benign   [facility] Gabriel Cirri, NP

## 2018-05-24 ENCOUNTER — Other Ambulatory Visit: Payer: Self-pay | Admitting: Family Medicine

## 2018-05-24 DIAGNOSIS — I10 Essential (primary) hypertension: Secondary | ICD-10-CM

## 2018-06-02 ENCOUNTER — Other Ambulatory Visit: Payer: Self-pay | Admitting: Family Medicine

## 2018-06-05 ENCOUNTER — Other Ambulatory Visit: Payer: Self-pay | Admitting: Family Medicine

## 2018-06-05 DIAGNOSIS — I10 Essential (primary) hypertension: Secondary | ICD-10-CM

## 2018-06-07 DIAGNOSIS — M170 Bilateral primary osteoarthritis of knee: Secondary | ICD-10-CM | POA: Diagnosis not present

## 2018-06-07 DIAGNOSIS — M25562 Pain in left knee: Secondary | ICD-10-CM | POA: Diagnosis not present

## 2018-06-07 DIAGNOSIS — M25561 Pain in right knee: Secondary | ICD-10-CM | POA: Diagnosis not present

## 2018-06-30 ENCOUNTER — Telehealth: Payer: Self-pay | Admitting: Family Medicine

## 2018-06-30 ENCOUNTER — Other Ambulatory Visit: Payer: Self-pay | Admitting: Unknown Physician Specialty

## 2018-06-30 DIAGNOSIS — I10 Essential (primary) hypertension: Secondary | ICD-10-CM

## 2018-06-30 NOTE — Telephone Encounter (Signed)
Please review for refill for atenolol 50 mg tab, needs an appointment LOV   10/19/17 LR 06/05/2018 for 30 tabs Pt of Gabriel Cirri, NP

## 2018-06-30 NOTE — Telephone Encounter (Signed)
Lvm for pt and will mail letter.

## 2018-06-30 NOTE — Telephone Encounter (Signed)
Will give one last courtesy fill but needs to get an appt scheduled ASAPc

## 2018-07-06 ENCOUNTER — Encounter: Payer: Self-pay | Admitting: Nurse Practitioner

## 2018-07-15 ENCOUNTER — Other Ambulatory Visit: Payer: Self-pay | Admitting: Family Medicine

## 2018-07-22 ENCOUNTER — Other Ambulatory Visit: Payer: Self-pay | Admitting: Family Medicine

## 2018-07-22 DIAGNOSIS — I10 Essential (primary) hypertension: Secondary | ICD-10-CM

## 2018-08-08 ENCOUNTER — Other Ambulatory Visit: Payer: Self-pay | Admitting: Family Medicine

## 2018-08-19 ENCOUNTER — Other Ambulatory Visit: Payer: Self-pay | Admitting: Family Medicine

## 2018-08-19 DIAGNOSIS — I10 Essential (primary) hypertension: Secondary | ICD-10-CM

## 2018-08-25 ENCOUNTER — Other Ambulatory Visit: Payer: Self-pay | Admitting: Family Medicine

## 2018-09-10 ENCOUNTER — Other Ambulatory Visit: Payer: Self-pay | Admitting: Family Medicine

## 2018-09-10 DIAGNOSIS — I10 Essential (primary) hypertension: Secondary | ICD-10-CM

## 2018-09-14 ENCOUNTER — Other Ambulatory Visit: Payer: Self-pay | Admitting: Nurse Practitioner

## 2018-09-14 NOTE — Telephone Encounter (Signed)
Requested medication (s) are due for refill today: yes  Requested medication (s) are on the active medication list: yes  Last refill:  06/30/18  Future visit scheduled: No  Notes to clinic:  Unable to refill per protocol    Requested Prescriptions  Pending Prescriptions Disp Refills   Vitamin D, Ergocalciferol, (DRISDOL) 1.25 MG (50000 UT) CAPS capsule [Pharmacy Med Name: VITAMIN D2 1.25MG (50,000 UNIT)] 12 capsule 0    Sig: TAKE 1 CAPSULE (50,000 UNITS TOTAL) EVERY 7 (SEVEN) DAYS BY MOUTH.     Endocrinology:  Vitamins - Vitamin D Supplementation Failed - 09/14/2018  2:32 PM      Failed - 50,000 IU strengths are not delegated      Failed - Phosphate in normal range and within 360 days    No results found for: PHOS       Failed - Vitamin D in normal range and within 360 days    Vit D, 25-Hydroxy  Date Value Ref Range Status  10/19/2017 16.9 (L) 30.0 - 100.0 ng/mL Final    Comment:    Vitamin D deficiency has been defined by the Institute of Medicine and an Endocrine Society practice guideline as a level of serum 25-OH vitamin D less than 20 ng/mL (1,2). The Endocrine Society went on to further define vitamin D insufficiency as a level between 21 and 29 ng/mL (2). 1. IOM (Institute of Medicine). 2010. Dietary reference    intakes for calcium and D. [place]: The    [employer]. 2. Holick MF, Binkley [place], Bischoff-Ferrari HA, et al.    Evaluation, treatment, and prevention of vitamin D    deficiency: an Endocrine Society clinical practice    guideline. JCEM. 2011 Jul; 96(7):1911-30.          Passed - Ca in normal range and within 360 days    Calcium  Date Value Ref Range Status  10/19/2017 9.6 8.7 - 10.2 mg/dL Final         Passed - Valid encounter within last 12 months    Recent Outpatient Visits          5 months ago Abscess of left leg   [facility] Kathrine Haddock, NP   11 months ago Hypercholesterolemia   [facility] Lilia Argue, [place]   1 year ago Chronic pain of right knee   [facility] Kathrine Haddock, NP   2 years ago Need for tetanus, diphtheria, and acellular pertussis (Tdap) vaccine   [facility] Kathrine Haddock, NP   2 years ago Essential hypertension, benign   [facility] Kathrine Haddock, NP

## 2018-09-17 ENCOUNTER — Other Ambulatory Visit: Payer: Self-pay | Admitting: Family Medicine

## 2018-09-17 NOTE — Telephone Encounter (Signed)
For further refills needs to see provider. 

## 2018-09-17 NOTE — Telephone Encounter (Signed)
For further refills this patient needs to schedule appointment please, if we could contact her.  Thank you.

## 2018-09-17 NOTE — Telephone Encounter (Signed)
Routing to provider  

## 2018-09-17 NOTE — Telephone Encounter (Signed)
Requested medication (s) are due for refill today: Yes  Requested medication (s) are on the active medication list: Yes  Last refill:  08/25/18  Future visit scheduled: No  Notes to clinic:  Left pt. A message to call ans make an appointment.    Requested Prescriptions  Pending Prescriptions Disp Refills   hydrochlorothiazide (MICROZIDE) 12.5 MG capsule [Pharmacy Med Name: HYDROCHLOROTHIAZIDE 12.5 MG CP] 30 capsule 0    Sig: TAKE 1 CAPSULE BY MOUTH EVERY DAY     Cardiovascular: Diuretics - Thiazide Failed - 09/17/2018  1:33 PM      Failed - Last BP in normal range    BP Readings from Last 1 Encounters:  03/23/18 (!) 167/112         Passed - Ca in normal range and within 360 days    Calcium  Date Value Ref Range Status  10/19/2017 9.6 8.7 - 10.2 mg/dL Final         Passed - Cr in normal range and within 360 days    Creatinine, Ser  Date Value Ref Range Status  10/19/2017 0.75 0.57 - 1.00 mg/dL Final         Passed - K in normal range and within 360 days    Potassium  Date Value Ref Range Status  10/19/2017 4.0 3.5 - 5.2 mmol/L Final         Passed - Na in normal range and within 360 days    Sodium  Date Value Ref Range Status  10/19/2017 142 134 - 144 mmol/L Final         Passed - Valid encounter within last 6 months    Recent Outpatient Visits          5 months ago Abscess of left leg   [facility] Kathrine Haddock, NP   11 months ago Hypercholesterolemia   [facility] [place], [place]   1 year ago Chronic pain of right knee   [facility] Kathrine Haddock, NP   2 years ago Need for tetanus, diphtheria, and acellular pertussis (Tdap) vaccine   [facility] Kathrine Haddock, NP   2 years ago Essential hypertension, benign   [facility] Kathrine Haddock, NP

## 2018-09-17 NOTE — Telephone Encounter (Signed)
Called and spoke to patient. Jolene's message relayed. Patient stated that she will not be able to get any days off at work in the next 30 days so she would not be able to schedule an appt for more refills . I advised patient that will let Jolene know. FYI

## 2018-09-20 NOTE — Telephone Encounter (Signed)
See PEC message below and telephone call in chart for refill on 09/17/18.

## 2018-09-20 NOTE — Telephone Encounter (Signed)
Needs appointment scheduled please for further refills.

## 2018-09-24 ENCOUNTER — Other Ambulatory Visit: Payer: Self-pay | Admitting: Unknown Physician Specialty

## 2018-09-24 ENCOUNTER — Other Ambulatory Visit: Payer: Self-pay | Admitting: Family Medicine

## 2018-09-24 DIAGNOSIS — I10 Essential (primary) hypertension: Secondary | ICD-10-CM

## 2018-09-24 NOTE — Telephone Encounter (Signed)
Forwarding medication refill request to clinical pool for review. 

## 2018-09-24 NOTE — Telephone Encounter (Signed)
Forwarding medication refill request to PCP for review. 

## 2018-09-24 NOTE — Telephone Encounter (Signed)
Forwarding medication refill to provider for review. 

## 2018-09-27 ENCOUNTER — Other Ambulatory Visit: Payer: Self-pay

## 2018-09-27 ENCOUNTER — Ambulatory Visit (INDEPENDENT_AMBULATORY_CARE_PROVIDER_SITE_OTHER): Payer: Commercial Managed Care - PPO | Admitting: Family Medicine

## 2018-09-27 ENCOUNTER — Encounter: Payer: Self-pay | Admitting: Family Medicine

## 2018-09-27 VITALS — BP 166/102

## 2018-09-27 DIAGNOSIS — E7800 Pure hypercholesterolemia, unspecified: Secondary | ICD-10-CM | POA: Diagnosis not present

## 2018-09-27 DIAGNOSIS — I10 Essential (primary) hypertension: Secondary | ICD-10-CM

## 2018-09-27 NOTE — Progress Notes (Signed)
BP (!) 166/102   SpO2 95%    Subjective:    Patient ID: Rhonda Stanley, female    DOB: 1965-06-27, 53 y.o.   MRN: [id number]  HPI: Rhonda Stanley is a 53 y.o. female  Chief Complaint  Patient presents with  . Hyperlipidemia  . Hypertension    . This visit was completed via WebEx due to the restrictions of the COVID-19 pandemic. All issues as above were discussed and addressed. Physical exam was done as above through visual confirmation on WebEx. If it was felt that the patient should be evaluated in the office, they were directed there. The patient verbally consented to this visit. . Location of the patient: home . Location of the provider: home . Those involved with this call:  . Provider: Merrie Roof, PA-C . CMA: Tiffany Reel, CMA . Front Desk/Registration: Jill Side  . Time spent on call: 15 minutes with patient face to face via video conference. More than 50% of this time was spent in counseling and coordination of care. 5 minutes total spent in review of patient's record and preparation of their chart. I verified patient identity using two factors (patient name and date of birth). Patient consents verbally to being seen via telemedicine visit today.   Patient presenting today for f/u chronic conditions.   HTN - does not have access to BP monitor at home, has not checked readings in quite some time. Taking her medication faithfully without side effects. Denies CP, SOB, HAs, dizziness. Tries to eat healthy and stay active.   HLD - on low dose lipitor without side effects. Denies claudication, myalgias.   No new concerns today.   Relevant past medical, surgical, family and social history reviewed and updated as indicated. Interim medical history since our last visit reviewed. Allergies and medications reviewed and updated.  Review of Systems  Per HPI unless specifically indicated above     Objective:    BP (!) 166/102   SpO2 95%   Wt Readings from Last 3  Encounters:  03/23/18 208 lb 14.4 oz (94.8 kg)  02/22/18 198 lb 8 oz (90 kg)  10/19/17 198 lb 8 oz (90 kg)    Physical Exam Vitals signs and nursing note reviewed.  Constitutional:      General: She is not in acute distress.    Appearance: Normal appearance.  HENT:     Head: Atraumatic.     Right Ear: External ear normal.     Left Ear: External ear normal.     Nose: Nose normal. No congestion.     Mouth/Throat:     Mouth: Mucous membranes are moist.     Pharynx: Oropharynx is clear. No posterior oropharyngeal erythema.  Eyes:     Extraocular Movements: Extraocular movements intact.     Conjunctiva/sclera: Conjunctivae normal.  Neck:     Musculoskeletal: Normal range of motion.  Cardiovascular:     Comments: Unable to assess via virtual visit Pulmonary:     Effort: Pulmonary effort is normal. No respiratory distress.  Musculoskeletal: Normal range of motion.  Skin:    General: Skin is dry.     Findings: No erythema.  Neurological:     Mental Status: She is alert and oriented to person, place, and time.  Psychiatric:        Mood and Affect: Mood normal.        Thought Content: Thought content normal.        Judgment: Judgment normal.  Results for orders placed or performed in visit on 10/19/17  Comprehensive metabolic panel  Result Value Ref Range   Glucose 84 65 - 99 mg/dL   BUN 13 6 - 24 mg/dL   Creatinine, Ser 1.610.75 0.57 - 1.00 mg/dL   GFR calc non Af Amer 93 >59 mL/min/1.73   GFR calc Af Amer 107 >59 mL/min/1.73   BUN/Creatinine Ratio 17 9 - 23   Sodium 142 134 - 144 mmol/L   Potassium 4.0 3.5 - 5.2 mmol/L   Chloride 99 96 - 106 mmol/L   CO2 27 20 - 29 mmol/L   Calcium 9.6 8.7 - 10.2 mg/dL   Total Protein 7.2 6.0 - 8.5 g/dL   Albumin 4.2 3.5 - 5.5 g/dL   Globulin, Total 3.0 1.5 - 4.5 g/dL   Albumin/Globulin Ratio 1.4 1.2 - 2.2   Bilirubin Total 0.7 0.0 - 1.2 mg/dL   Alkaline Phosphatase 106 39 - 117 IU/L   AST 18 0 - 40 IU/L   ALT 20 0 - 32 IU/L   Lipid Panel w/o Chol/HDL Ratio  Result Value Ref Range   Cholesterol, Total 189 100 - 199 mg/dL   Triglycerides 90 0 - 149 mg/dL   HDL 53 >09>39 mg/dL   VLDL Cholesterol Cal 18 5 - 40 mg/dL   LDL Calculated 604118 (H) 0 - 99 mg/dL  Vitamin D (25 hydroxy)  Result Value Ref Range   Vit D, 25-Hydroxy 16.9 (L) 30.0 - 100.0 ng/mL      Assessment & Plan:   Problem List Items Addressed This Visit      Cardiovascular and Mediastinum   Essential hypertension, benign - Primary    Patient without means to check BP at home. Discussed having vital signs checked when she presents to clinic later this week for her lab work. Will review these readings and adjust therapy as needed      Relevant Orders   Comprehensive metabolic panel (Completed)     Other   Hypercholesterolemia    Recheck lipids, adjust as needed. Continue good lifestyle habits      Relevant Orders   Lipid Panel w/o Chol/HDL Ratio (Completed)   Comprehensive metabolic panel (Completed)       Follow up plan: No follow-ups on file. follow up dependent on results

## 2018-09-28 ENCOUNTER — Other Ambulatory Visit: Payer: Self-pay

## 2018-09-28 ENCOUNTER — Encounter: Payer: Self-pay | Admitting: Family Medicine

## 2018-09-28 ENCOUNTER — Other Ambulatory Visit: Payer: Commercial Managed Care - PPO

## 2018-09-28 DIAGNOSIS — E7800 Pure hypercholesterolemia, unspecified: Secondary | ICD-10-CM

## 2018-09-28 DIAGNOSIS — I10 Essential (primary) hypertension: Secondary | ICD-10-CM

## 2018-09-29 LAB — COMPREHENSIVE METABOLIC PANEL
ALT: 23 IU/L (ref 0–32)
AST: 24 IU/L (ref 0–40)
Albumin/Globulin Ratio: 1.6 (ref 1.2–2.2)
Albumin: 4.7 g/dL (ref 3.8–4.9)
Alkaline Phosphatase: 89 IU/L (ref 39–117)
BUN/Creatinine Ratio: 24 — ABNORMAL HIGH (ref 9–23)
BUN: 17 mg/dL (ref 6–24)
Bilirubin Total: 0.5 mg/dL (ref 0.0–1.2)
CO2: 26 mmol/L (ref 20–29)
Calcium: 10 mg/dL (ref 8.7–10.2)
Chloride: 103 mmol/L (ref 96–106)
Creatinine, Ser: 0.72 mg/dL (ref 0.57–1.00)
GFR calc Af Amer: 112 mL/min/{1.73_m2} (ref >59)
GFR calc non Af Amer: 97 mL/min/{1.73_m2} (ref >59)
Globulin, Total: 3 g/dL (ref 1.5–4.5)
Glucose: 111 mg/dL — ABNORMAL HIGH (ref 65–99)
Potassium: 4.7 mmol/L (ref 3.5–5.2)
Sodium: 144 mmol/L (ref 134–144)
Total Protein: 7.7 g/dL (ref 6.0–8.5)

## 2018-09-29 LAB — LIPID PANEL W/O CHOL/HDL RATIO
Cholesterol, Total: 233 mg/dL — ABNORMAL HIGH (ref 100–199)
HDL: 52 mg/dL (ref >39)
LDL Calculated: 136 mg/dL — ABNORMAL HIGH (ref 0–99)
Triglycerides: 225 mg/dL — ABNORMAL HIGH (ref 0–149)
VLDL Cholesterol Cal: 45 mg/dL — ABNORMAL HIGH (ref 5–40)

## 2018-10-01 ENCOUNTER — Telehealth: Payer: Self-pay | Admitting: Family Medicine

## 2018-10-01 DIAGNOSIS — I10 Essential (primary) hypertension: Secondary | ICD-10-CM

## 2018-10-01 MED ORDER — AMLODIPINE BESYLATE 5 MG PO TABS: 5.0000 mg | ORAL_TABLET | Freq: Every day | ORAL | 1 refills | Status: DC

## 2018-10-01 MED ORDER — ATORVASTATIN CALCIUM 20 MG PO TABS: 20.0000 mg | ORAL_TABLET | Freq: Every day | ORAL | 1 refills | Status: DC

## 2018-10-01 MED ORDER — HYDROCHLOROTHIAZIDE 12.5 MG PO CAPS: 12.5000 mg | ORAL_CAPSULE | Freq: Every day | ORAL | 1 refills | Status: DC

## 2018-10-01 MED ORDER — ATENOLOL 50 MG PO TABS: 50.0000 mg | ORAL_TABLET | Freq: Every day | ORAL | 1 refills | Status: DC

## 2018-10-01 NOTE — Assessment & Plan Note (Signed)
Patient without means to check BP at home. Discussed having vital signs checked when she presents to clinic later this week for her lab work. Will review these readings and adjust therapy as needed

## 2018-10-01 NOTE — Assessment & Plan Note (Signed)
Recheck lipids, adjust as needed. Continue good lifestyle habits 

## 2018-10-01 NOTE — Telephone Encounter (Signed)
Called pt, discussed elevated BP reading. Will fax a script for a BP monitor to local medical supply store so she can be monitoring and logging home readings and recheck in 1 month. DASH diet, continue current regimen in meantime.   Cholesterol elevated as well. Will increase lipitor dose to 20 mg.

## 2018-10-28 ENCOUNTER — Encounter: Payer: Self-pay | Admitting: Family Medicine

## 2019-04-03 ENCOUNTER — Other Ambulatory Visit: Payer: Self-pay | Admitting: Family Medicine

## 2019-04-03 DIAGNOSIS — I10 Essential (primary) hypertension: Secondary | ICD-10-CM

## 2019-04-13 ENCOUNTER — Other Ambulatory Visit: Payer: Self-pay | Admitting: Family Medicine

## 2019-04-13 NOTE — Telephone Encounter (Signed)
Routing to provider. Overdue for f/up per last telephone encounter.

## 2019-04-13 NOTE — Telephone Encounter (Signed)
Requested medications are due for refill today? Yes  Requested medications are on active medication list?  Yes  Last Refill:   10/01/2018  # 90 with 1 refill - It was noted at the time of this refill for further refills patient will need to see provider.    Future visit scheduled?  No   Notes to Clinic:  Patient needs to be seen in office.

## 2019-05-02 ENCOUNTER — Ambulatory Visit: Payer: Commercial Managed Care - PPO | Attending: Internal Medicine

## 2019-05-02 DIAGNOSIS — Z23 Encounter for immunization: Secondary | ICD-10-CM

## 2019-05-02 NOTE — Progress Notes (Signed)
   Covid-19 Vaccination Clinic  Name:  Rhonda Stanley    MRN: [id number] DOB: 08/08/1965  05/02/2019  [name] was observed post Covid-19 immunization for 15 minutes without incident. She was provided with Vaccine Information Sheet and instruction to access the V-Safe system.   [name] was instructed to call 911 with any severe reactions post vaccine: Marland Kitchen Difficulty breathing  . Swelling of face and throat  . A fast heartbeat  . A bad rash all over body  . Dizziness and weakness   Immunizations Administered    Name Date Dose VIS Date Route   Pfizer COVID-19 Vaccine 05/02/2019  6:14 PM 0.3 mL 01/14/2019 Intramuscular   Manufacturer: [employer], [employer]   Lot: [id number]   NDC: [address]-8

## 2019-05-18 ENCOUNTER — Telehealth: Payer: Self-pay | Admitting: Family Medicine

## 2019-05-23 ENCOUNTER — Ambulatory Visit: Payer: Commercial Managed Care - PPO | Attending: Internal Medicine

## 2019-05-23 DIAGNOSIS — Z23 Encounter for immunization: Secondary | ICD-10-CM

## 2019-05-23 NOTE — Progress Notes (Signed)
   Covid-19 Vaccination Clinic  Name:  TONIA AVINO    MRN: [id number] DOB: [birth year]  05/23/2019  Ms. Hiltz was observed post Covid-19 immunization for 15 minutes without incident. She was provided with Vaccine Information Sheet and instruction to access the V-Safe system.   Ms. Sane was instructed to call 911 with any severe reactions post vaccine: Marland Kitchen Difficulty breathing  . Swelling of face and throat  . A fast heartbeat  . A bad rash all over body  . Dizziness and weakness   Immunizations Administered    Name Date Dose VIS Date Route   Pfizer COVID-19 Vaccine 05/23/2019  4:27 PM 0.3 mL 03/30/2018 Intramuscular   Manufacturer: [employer], [employer]   Lot: [id number]   NDC: [address]-4

## 2019-06-06 NOTE — Telephone Encounter (Signed)
Could absolutely do a late virtual appt once she gets off work or depending on when her lunch break falls as long as she can obtain vital signs and then we could either order labs to a draw station or she could drop by after work for labs one day

## 2019-06-06 NOTE — Telephone Encounter (Signed)
Pt called back and states that she works at [employer] get off until 4 and she wouldn't be at her car at 4:15 to do a virtual, her lunch break is at 12 when we are closed. Pt states that she is unable to get off of work or she will be wrote up and states that she has been on medication for a while. Please advise.

## 2019-06-06 NOTE — Telephone Encounter (Signed)
OK to work in at 415 or 430 if needed virtually, or can go to [facility] until able to come in

## 2019-06-06 NOTE — Telephone Encounter (Signed)
Pt states she cannot get off work right now for an office visit due to staff shortages. Pt goes in at 7:30 am and gets off at 4:00 pm. Please advise. Pt states she needs her medication and not sure what to do. Last refill indicated OV needed for future refills.

## 2019-06-06 NOTE — Telephone Encounter (Signed)
Called to relay Rhonda Stanley's message no answer, left vm to call us back

## 2019-06-07 NOTE — Telephone Encounter (Signed)
Appt scheduled

## 2019-06-08 ENCOUNTER — Encounter: Payer: Self-pay | Admitting: Family Medicine

## 2019-06-08 ENCOUNTER — Telehealth: Payer: Self-pay | Admitting: Family Medicine

## 2019-06-08 ENCOUNTER — Other Ambulatory Visit: Payer: Self-pay

## 2019-06-08 ENCOUNTER — Ambulatory Visit: Payer: Commercial Managed Care - PPO | Admitting: Family Medicine

## 2019-06-08 DIAGNOSIS — I10 Essential (primary) hypertension: Secondary | ICD-10-CM

## 2019-06-08 MED ORDER — IRBESARTAN-HYDROCHLOROTHIAZIDE 300-12.5 MG PO TABS: 1.0000 | ORAL_TABLET | Freq: Every day | ORAL | 1 refills | Status: DC

## 2019-06-08 MED ORDER — HYDROCHLOROTHIAZIDE 12.5 MG PO CAPS: 12.5000 mg | ORAL_CAPSULE | Freq: Every day | ORAL | 1 refills | Status: DC

## 2019-06-08 MED ORDER — AMLODIPINE BESYLATE 5 MG PO TABS: 5.0000 mg | ORAL_TABLET | Freq: Every day | ORAL | 1 refills | Status: DC

## 2019-06-08 MED ORDER — ATENOLOL 50 MG PO TABS: 50.0000 mg | ORAL_TABLET | Freq: Every day | ORAL | 1 refills | Status: DC

## 2019-06-08 MED ORDER — ATORVASTATIN CALCIUM 20 MG PO TABS: 20.0000 mg | ORAL_TABLET | Freq: Every day | ORAL | 1 refills | Status: DC

## 2019-06-08 NOTE — Progress Notes (Signed)
BP (!) 173/95 (BP Location: Left Arm, Patient Position: Sitting, Cuff Size: Normal)   Pulse 71   Temp 98.3 F (36.8 C) (Oral)   Wt 201 lb (91.2 kg)   SpO2 97%   BMI 35.61 kg/m    Subjective:    Patient ID: Rhonda Stanley, female    DOB: [birth year], 54 y.o.   MRN: [id number]  HPI: Rhonda Stanley is a 54 y.o. female  Chief Complaint  Patient presents with  . Medication Refill  . surgery clearance   Presenting today for overdue HTN f/u.   Has been out of HCTZ for over a week but taking other medications faithfully. Denies CP, SOB, HAs, dizziness. Has not been checking home BPs due to lack of access to monitor. Under significant stress, not exercising or watching diet closely.   Having knee surgery soon and needing surgical clearance.   Relevant past medical, surgical, family and social history reviewed and updated as indicated. Interim medical history since our last visit reviewed. Allergies and medications reviewed and updated.  Review of Systems  Per HPI unless specifically indicated above     Objective:    BP (!) 173/95 (BP Location: Left Arm, Patient Position: Sitting, Cuff Size: Normal)   Pulse 71   Temp 98.3 F (36.8 C) (Oral)   Wt 201 lb (91.2 kg)   SpO2 97%   BMI 35.61 kg/m   Wt Readings from Last 3 Encounters:  06/08/19 201 lb (91.2 kg)  03/23/18 208 lb 14.4 oz (94.8 kg)  02/22/18 198 lb 8 oz (90 kg)    Physical Exam Vitals and nursing note reviewed.  Constitutional:      Appearance: Normal appearance. She is not ill-appearing.  HENT:     Head: Atraumatic.  Eyes:     Extraocular Movements: Extraocular movements intact.     Conjunctiva/sclera: Conjunctivae normal.  Cardiovascular:     Rate and Rhythm: Normal rate and regular rhythm.     Heart sounds: Normal heart sounds.  Pulmonary:     Effort: Pulmonary effort is normal.     Breath sounds: Normal breath sounds.  Musculoskeletal:        General: Normal range of motion.     Cervical back:  Normal range of motion and neck supple.  Skin:    General: Skin is warm and dry.  Neurological:     Mental Status: She is alert and oriented to person, place, and time.  Psychiatric:        Mood and Affect: Mood normal.        Thought Content: Thought content normal.        Judgment: Judgment normal.     Results for orders placed or performed in visit on 09/28/18  Comprehensive metabolic panel  Result Value Ref Range   Glucose 111 (H) 65 - 99 mg/dL   BUN 17 6 - 24 mg/dL   Creatinine, Ser 8.54 0.57 - 1.00 mg/dL   GFR calc non Af Amer 97 >59 mL/min/1.73   GFR calc Af Amer 112 >59 mL/min/1.73   BUN/Creatinine Ratio 24 (H) 9 - 23   Sodium 144 134 - 144 mmol/L   Potassium 4.7 3.5 - 5.2 mmol/L   Chloride 103 96 - 106 mmol/L   CO2 26 20 - 29 mmol/L   Calcium 10.0 8.7 - 10.2 mg/dL   Total Protein 7.7 6.0 - 8.5 g/dL   Albumin 4.7 3.8 - 4.9 g/dL   Globulin, Total 3.0 1.5 - 4.5 g/dL  Albumin/Globulin Ratio 1.6 1.2 - 2.2   Bilirubin Total 0.5 0.0 - 1.2 mg/dL   Alkaline Phosphatase 89 39 - 117 IU/L   AST 24 0 - 40 IU/L   ALT 23 0 - 32 IU/L  Lipid Panel w/o Chol/HDL Ratio  Result Value Ref Range   Cholesterol, Total 233 (H) 100 - 199 mg/dL   Triglycerides 225 (H) 0 - 149 mg/dL   HDL 52 >39 mg/dL   VLDL Cholesterol Cal 45 (H) 5 - 40 mg/dL   LDL Calculated 136 (H) 0 - 99 mg/dL      Assessment & Plan:   Problem List Items Addressed This Visit      Cardiovascular and Mediastinum   Essential hypertension, benign    BP poorly controlled, restart HCTZ and recheck next week. Offered script for BP monitor, pt declines due to possible cost for monitor. DASH diet, exercise as tolerated reviewed. Will need to improve control prior to surgical clearance      Relevant Medications   irbesartan-hydrochlorothiazide (AVALIDE) 300-12.5 MG tablet   hydrochlorothiazide (MICROZIDE) 12.5 MG capsule   atorvastatin (LIPITOR) 20 MG tablet   atenolol (TENORMIN) 50 MG tablet   amLODipine (NORVASC) 5  MG tablet       Follow up plan: Return for pre-operative clearance ASAP.

## 2019-06-08 NOTE — Telephone Encounter (Signed)
Called pt script screening done   Copied from CRM [id number]. Topic: Appointment Scheduling - Scheduling Inquiry for Clinic >> Jun 08, 2019 10:42 AM Randol Kern wrote: Reason for CRM: Pt called requesting to come into the office today instead of having a Mychart video visit. Please advise  Best contact: [phone]

## 2019-06-12 NOTE — Assessment & Plan Note (Signed)
BP poorly controlled, restart HCTZ and recheck next week. Offered script for BP monitor, pt declines due to possible cost for monitor. DASH diet, exercise as tolerated reviewed. Will need to improve control prior to surgical clearance

## 2019-06-16 ENCOUNTER — Other Ambulatory Visit: Payer: Self-pay

## 2019-06-16 ENCOUNTER — Ambulatory Visit (INDEPENDENT_AMBULATORY_CARE_PROVIDER_SITE_OTHER): Payer: Commercial Managed Care - PPO | Admitting: Family Medicine

## 2019-06-16 ENCOUNTER — Encounter: Payer: Self-pay | Admitting: Family Medicine

## 2019-06-16 VITALS — BP 154/100 | HR 58 | Temp 98.3°F | Ht 63.23 in | Wt 202.5 lb

## 2019-06-16 DIAGNOSIS — Z01818 Encounter for other preprocedural examination: Secondary | ICD-10-CM

## 2019-06-16 DIAGNOSIS — Z01810 Encounter for preprocedural cardiovascular examination: Secondary | ICD-10-CM | POA: Diagnosis not present

## 2019-06-16 DIAGNOSIS — I10 Essential (primary) hypertension: Secondary | ICD-10-CM | POA: Diagnosis not present

## 2019-06-16 LAB — UA/M W/RFLX CULTURE, ROUTINE
Bilirubin, UA: NEGATIVE
Glucose, UA: NEGATIVE
Ketones, UA: NEGATIVE
Leukocytes,UA: NEGATIVE
Nitrite, UA: NEGATIVE
Protein,UA: NEGATIVE
RBC, UA: NEGATIVE
Specific Gravity, UA: 1.015 (ref 1.005–1.030)
Urobilinogen, Ur: 1 mg/dL (ref 0.2–1.0)
pH, UA: 6 (ref 5.0–7.5)

## 2019-06-16 MED ORDER — AMLODIPINE BESYLATE 10 MG PO TABS
10.0000 mg | ORAL_TABLET | Freq: Every day | ORAL | 0 refills | Status: DC
Start: 2019-06-16 — End: 2019-07-15

## 2019-06-16 NOTE — Progress Notes (Signed)
BP (!) 154/100   Pulse (!) 58   Temp 98.3 F (36.8 C)   Ht 5' 3.23" (1.606 m)   Wt 202 lb 8 oz (91.9 kg)   SpO2 98%   BMI 35.61 kg/m    Subjective:    Patient ID: Rhonda Stanley, female    DOB: [birth year], 54 y.o.   MRN: [id number]  HPI: Rhonda Stanley is a 54 y.o. female  Chief Complaint  Patient presents with  . surgical clearance    Knee replacement   Here today for surgical clearance exam for upcoming right knee replacement procedure. Feeling in usual state of health overall, but notes her severe knee pain has been a major source of anxiety, agitation for her and has also affected sleep and BPs. Also having significant stress at home and work adding onto things. Home BPs when she's able to check are 139/91 and 153/94 range back on medications (had run out of some due to work schedule preventing timely f/u). Denies CP, SOB, HAs, dizziness. No other concerns today.   Relevant past medical, surgical, family and social history reviewed and updated as indicated. Interim medical history since our last visit reviewed. Allergies and medications reviewed and updated.  Review of Systems  Per HPI unless specifically indicated above     Objective:    BP (!) 154/100   Pulse (!) 58   Temp 98.3 F (36.8 C)   Ht 5' 3.23" (1.606 m)   Wt 202 lb 8 oz (91.9 kg)   SpO2 98%   BMI 35.61 kg/m   Wt Readings from Last 3 Encounters:  06/23/19 202 lb (91.6 kg)  06/16/19 202 lb 8 oz (91.9 kg)  06/08/19 201 lb (91.2 kg)    Physical Exam Vitals and nursing note reviewed.  Constitutional:      Appearance: Normal appearance. She is not ill-appearing.  HENT:     Head: Atraumatic.  Eyes:     Extraocular Movements: Extraocular movements intact.     Conjunctiva/sclera: Conjunctivae normal.  Cardiovascular:     Rate and Rhythm: Normal rate and regular rhythm.     Heart sounds: Normal heart sounds.  Pulmonary:     Effort: Pulmonary effort is normal.     Breath sounds: Normal breath  sounds.  Musculoskeletal:        General: Normal range of motion.     Cervical back: Normal range of motion and neck supple.  Skin:    General: Skin is warm and dry.  Neurological:     Mental Status: She is alert and oriented to person, place, and time.  Psychiatric:        Mood and Affect: Mood normal.        Thought Content: Thought content normal.        Judgment: Judgment normal.     Results for orders placed or performed in visit on 06/16/19  Comprehensive metabolic panel  Result Value Ref Range   Glucose 91 65 - 99 mg/dL   BUN 15 6 - 24 mg/dL   Creatinine, Ser 0.66 0.57 - 1.00 mg/dL   GFR calc non Af Amer 102 >59 mL/min/1.73   GFR calc Af Amer 117 >59 mL/min/1.73   BUN/Creatinine Ratio 23 9 - 23   Sodium 141 134 - 144 mmol/L   Potassium 3.9 3.5 - 5.2 mmol/L   Chloride 103 96 - 106 mmol/L   CO2 27 20 - 29 mmol/L   Calcium 9.9 8.7 - 10.2 mg/dL  Total Protein 7.5 6.0 - 8.5 g/dL   Albumin 4.4 3.8 - 4.9 g/dL   Globulin, Total 3.1 1.5 - 4.5 g/dL   Albumin/Globulin Ratio 1.4 1.2 - 2.2   Bilirubin Total 0.9 0.0 - 1.2 mg/dL   Alkaline Phosphatase 107 39 - 117 IU/L   AST 24 0 - 40 IU/L   ALT 20 0 - 32 IU/L  CBC with Differential/Platelet  Result Value Ref Range   WBC 5.4 3.4 - 10.8 x10E3/uL   RBC 4.94 3.77 - 5.28 x10E6/uL   Hemoglobin 13.5 11.1 - 15.9 g/dL   Hematocrit 16.0 73.7 - 46.6 %   MCV 83 79 - 97 fL   MCH 27.3 26.6 - 33.0 pg   MCHC 33.0 31.5 - 35.7 g/dL   RDW 10.6 26.9 - 48.5 %   Platelets 317 150 - 450 x10E3/uL   Neutrophils 64 Not Estab. %   Lymphs 24 Not Estab. %   Monocytes 9 Not Estab. %   Eos 2 Not Estab. %   Basos 1 Not Estab. %   Neutrophils Absolute 3.4 1.4 - 7.0 x10E3/uL   Lymphocytes Absolute 1.3 0.7 - 3.1 x10E3/uL   Monocytes Absolute 0.5 0.1 - 0.9 x10E3/uL   EOS (ABSOLUTE) 0.1 0.0 - 0.4 x10E3/uL   Basophils Absolute 0.0 0.0 - 0.2 x10E3/uL   Immature Granulocytes 0 Not Estab. %   Immature Grans (Abs) 0.0 0.0 - 0.1 x10E3/uL  UA/M w/rflx  Culture, Routine   Specimen: Urine   URINE  Result Value Ref Range   Specific Gravity, UA 1.015 1.005 - 1.030   pH, UA 6.0 5.0 - 7.5   Color, UA Yellow Yellow   Appearance Ur Clear Clear   Leukocytes,UA Negative Negative   Protein,UA Negative Negative/Trace   Glucose, UA Negative Negative   Ketones, UA Negative Negative   RBC, UA Negative Negative   Bilirubin, UA Negative Negative   Urobilinogen, Ur 1.0 0.2 - 1.0 mg/dL   Nitrite, UA Negative Negative  HgB A1c  Result Value Ref Range   Hgb A1c MFr Bld 6.0 (H) 4.8 - 5.6 %   Est. average glucose Bld gHb Est-mCnc 126 mg/dL      Assessment & Plan:   Problem List Items Addressed This Visit      Cardiovascular and Mediastinum   Essential hypertension, benign    Not under good control, will postpone clearance for surgery until normalized. Add amlodipine and recheck in 2 weeks. Discussed importance of getting home monitor for checks      Relevant Medications   amLODipine (NORVASC) 10 MG tablet    Other Visit Diagnoses    Preop cardiovascular exam    -  Primary   EKG today benign without ST or T wave changes, NSR. Pending normalization of BPs and normal labs will soon provide surgical clearance   Relevant Orders   EKG 12-Lead (Completed)   Pre-operative exam       Relevant Orders   Comprehensive metabolic panel (Completed)   CBC with Differential/Platelet (Completed)   UA/M w/rflx Culture, Routine (Completed)   HgB A1c (Completed)       Follow up plan: Return in about 1 week (around 06/23/2019) for HTN.

## 2019-06-17 LAB — COMPREHENSIVE METABOLIC PANEL
ALT: 20 IU/L (ref 0–32)
AST: 24 IU/L (ref 0–40)
Albumin/Globulin Ratio: 1.4 (ref 1.2–2.2)
Albumin: 4.4 g/dL (ref 3.8–4.9)
Alkaline Phosphatase: 107 IU/L (ref 39–117)
BUN/Creatinine Ratio: 23 (ref 9–23)
BUN: 15 mg/dL (ref 6–24)
Bilirubin Total: 0.9 mg/dL (ref 0.0–1.2)
CO2: 27 mmol/L (ref 20–29)
Calcium: 9.9 mg/dL (ref 8.7–10.2)
Chloride: 103 mmol/L (ref 96–106)
Creatinine, Ser: 0.66 mg/dL (ref 0.57–1.00)
GFR calc Af Amer: 117 mL/min/{1.73_m2} (ref >59)
GFR calc non Af Amer: 102 mL/min/{1.73_m2} (ref >59)
Globulin, Total: 3.1 g/dL (ref 1.5–4.5)
Glucose: 91 mg/dL (ref 65–99)
Potassium: 3.9 mmol/L (ref 3.5–5.2)
Sodium: 141 mmol/L (ref 134–144)
Total Protein: 7.5 g/dL (ref 6.0–8.5)

## 2019-06-17 LAB — CBC WITH DIFFERENTIAL/PLATELET
Basophils Absolute: 0 10*3/uL (ref 0.0–0.2)
Basos: 1 %
EOS (ABSOLUTE): 0.1 10*3/uL (ref 0.0–0.4)
Eos: 2 %
Hematocrit: 40.9 % (ref 34.0–46.6)
Hemoglobin: 13.5 g/dL (ref 11.1–15.9)
Immature Grans (Abs): 0 10*3/uL (ref 0.0–0.1)
Immature Granulocytes: 0 %
Lymphocytes Absolute: 1.3 10*3/uL (ref 0.7–3.1)
Lymphs: 24 %
MCH: 27.3 pg (ref 26.6–33.0)
MCHC: 33 g/dL (ref 31.5–35.7)
MCV: 83 fL (ref 79–97)
Monocytes Absolute: 0.5 10*3/uL (ref 0.1–0.9)
Monocytes: 9 %
Neutrophils Absolute: 3.4 10*3/uL (ref 1.4–7.0)
Neutrophils: 64 %
Platelets: 317 10*3/uL (ref 150–450)
RBC: 4.94 x10E6/uL (ref 3.77–5.28)
RDW: 13.3 % (ref 11.7–15.4)
WBC: 5.4 10*3/uL (ref 3.4–10.8)

## 2019-06-17 LAB — HEMOGLOBIN A1C
Est. average glucose Bld gHb Est-mCnc: 126 mg/dL
Hgb A1c MFr Bld: 6 % — ABNORMAL HIGH (ref 4.8–5.6)

## 2019-06-23 ENCOUNTER — Encounter: Payer: Self-pay | Admitting: Family Medicine

## 2019-06-23 ENCOUNTER — Other Ambulatory Visit: Payer: Self-pay

## 2019-06-23 ENCOUNTER — Ambulatory Visit (INDEPENDENT_AMBULATORY_CARE_PROVIDER_SITE_OTHER): Payer: Commercial Managed Care - PPO | Admitting: Family Medicine

## 2019-06-23 VITALS — BP 138/86 | HR 68 | Temp 98.7°F | Wt 202.0 lb

## 2019-06-23 DIAGNOSIS — I10 Essential (primary) hypertension: Secondary | ICD-10-CM

## 2019-06-23 MED ORDER — HYDRALAZINE HCL 10 MG PO TABS
10.0000 mg | ORAL_TABLET | Freq: Two times a day (BID) | ORAL | 1 refills | Status: DC | PRN
Start: 2019-06-23 — End: 2019-07-15

## 2019-06-23 NOTE — Assessment & Plan Note (Signed)
BPs overall improved on regimen. Will add hydralazine 10 mg BID prn for times when pressures are elevated at home, but likely once knee pain improves her BPs will also be under better control. Will clear for surgery. Forms completed, scanned and faxed.

## 2019-06-23 NOTE — Progress Notes (Signed)
BP 138/86   Pulse 68   Temp 98.7 F (37.1 C) (Oral)   Wt 202 lb (91.6 kg)   SpO2 97%   BMI 35.53 kg/m    Subjective:    Patient ID: Rhonda Stanley, female    DOB: [birth year], 54 y.o.   MRN: [id number]  HPI: Rhonda Stanley is a 54 y.o. female  Chief Complaint  Patient presents with  . Hypertension   Presenting today for f/u HTN after increasing amlodipine to 10 mg in addition to remainder of BP regimen. Home readings running 140s/80s - 160s/ 80s at times but other times 130s/80s. Denies CP, HAs, SOB, dizziness. Still in severe pain from her knee that she's awaiting surgery on and feels this plus stress is negatively impacting her BPs. Recent lab and EKG for pre operative clearance were stable, has been pending improved BP readings for clearance for surgery.   Relevant past medical, surgical, family and social history reviewed and updated as indicated. Interim medical history since our last visit reviewed. Allergies and medications reviewed and updated.  Review of Systems  Per HPI unless specifically indicated above     Objective:    BP 138/86   Pulse 68   Temp 98.7 F (37.1 C) (Oral)   Wt 202 lb (91.6 kg)   SpO2 97%   BMI 35.53 kg/m   Wt Readings from Last 3 Encounters:  06/23/19 202 lb (91.6 kg)  06/16/19 202 lb 8 oz (91.9 kg)  06/08/19 201 lb (91.2 kg)    Physical Exam Vitals and nursing note reviewed.  Constitutional:      Appearance: Normal appearance. She is not ill-appearing.  HENT:     Head: Atraumatic.  Eyes:     Extraocular Movements: Extraocular movements intact.     Conjunctiva/sclera: Conjunctivae normal.  Cardiovascular:     Rate and Rhythm: Normal rate and regular rhythm.     Heart sounds: Normal heart sounds.  Pulmonary:     Effort: Pulmonary effort is normal.     Breath sounds: Normal breath sounds.  Musculoskeletal:        General: Normal range of motion.     Cervical back: Normal range of motion and neck supple.  Skin:  General: Skin is warm and dry.  Neurological:     Mental Status: She is alert and oriented to person, place, and time.  Psychiatric:        Mood and Affect: Mood normal.        Thought Content: Thought content normal.        Judgment: Judgment normal.     Results for orders placed or performed in visit on 06/16/19  Comprehensive metabolic panel  Result Value Ref Range   Glucose 91 65 - 99 mg/dL   BUN 15 6 - 24 mg/dL   Creatinine, Ser 0.66 0.57 - 1.00 mg/dL   GFR calc non Af Amer 102 >59 mL/min/1.73   GFR calc Af Amer 117 >59 mL/min/1.73   BUN/Creatinine Ratio 23 9 - 23   Sodium 141 134 - 144 mmol/L   Potassium 3.9 3.5 - 5.2 mmol/L   Chloride 103 96 - 106 mmol/L   CO2 27 20 - 29 mmol/L   Calcium 9.9 8.7 - 10.2 mg/dL   Total Protein 7.5 6.0 - 8.5 g/dL   Albumin 4.4 3.8 - 4.9 g/dL   Globulin, Total 3.1 1.5 - 4.5 g/dL   Albumin/Globulin Ratio 1.4 1.2 - 2.2   Bilirubin Total 0.9 0.0 -  1.2 mg/dL   Alkaline Phosphatase 107 39 - 117 IU/L   AST 24 0 - 40 IU/L   ALT 20 0 - 32 IU/L  CBC with Differential/Platelet  Result Value Ref Range   WBC 5.4 3.4 - 10.8 x10E3/uL   RBC 4.94 3.77 - 5.28 x10E6/uL   Hemoglobin 13.5 11.1 - 15.9 g/dL   Hematocrit 88.3 25.4 - 46.6 %   MCV 83 79 - 97 fL   MCH 27.3 26.6 - 33.0 pg   MCHC 33.0 31.5 - 35.7 g/dL   RDW 98.2 64.1 - 58.3 %   Platelets 317 150 - 450 x10E3/uL   Neutrophils 64 Not Estab. %   Lymphs 24 Not Estab. %   Monocytes 9 Not Estab. %   Eos 2 Not Estab. %   Basos 1 Not Estab. %   Neutrophils Absolute 3.4 1.4 - 7.0 x10E3/uL   Lymphocytes Absolute 1.3 0.7 - 3.1 x10E3/uL   Monocytes Absolute 0.5 0.1 - 0.9 x10E3/uL   EOS (ABSOLUTE) 0.1 0.0 - 0.4 x10E3/uL   Basophils Absolute 0.0 0.0 - 0.2 x10E3/uL   Immature Granulocytes 0 Not Estab. %   Immature Grans (Abs) 0.0 0.0 - 0.1 x10E3/uL  UA/M w/rflx Culture, Routine   Specimen: Urine   URINE  Result Value Ref Range   Specific Gravity, UA 1.015 1.005 - 1.030   pH, UA 6.0 5.0 - 7.5    Color, UA Yellow Yellow   Appearance Ur Clear Clear   Leukocytes,UA Negative Negative   Protein,UA Negative Negative/Trace   Glucose, UA Negative Negative   Ketones, UA Negative Negative   RBC, UA Negative Negative   Bilirubin, UA Negative Negative   Urobilinogen, Ur 1.0 0.2 - 1.0 mg/dL   Nitrite, UA Negative Negative  HgB A1c  Result Value Ref Range   Hgb A1c MFr Bld 6.0 (H) 4.8 - 5.6 %   Est. average glucose Bld gHb Est-mCnc 126 mg/dL      Assessment & Plan:   Problem List Items Addressed This Visit      Cardiovascular and Mediastinum   Essential hypertension, benign - Primary    BPs overall improved on regimen. Will add hydralazine 10 mg BID prn for times when pressures are elevated at home, but likely once knee pain improves her BPs will also be under better control. Will clear for surgery. Forms completed, scanned and faxed.       Relevant Medications   hydrALAZINE (APRESOLINE) 10 MG tablet       Follow up plan: Return in about 3 months (around 09/23/2019) for CPE.

## 2019-06-27 NOTE — Assessment & Plan Note (Signed)
Not under good control, will postpone clearance for surgery until normalized. Add amlodipine and recheck in 2 weeks. Discussed importance of getting home monitor for checks

## 2019-07-15 ENCOUNTER — Other Ambulatory Visit: Payer: Self-pay | Admitting: Family Medicine

## 2019-07-19 ENCOUNTER — Ambulatory Visit: Admit: 2019-07-19 | Payer: Commercial Managed Care - PPO | Admitting: Orthopedic Surgery

## 2019-07-19 SURGERY — ARTHROPLASTY, HIP, TOTAL, ANTERIOR APPROACH
Anesthesia: Choice | Site: Hip | Laterality: Right

## 2019-10-11 ENCOUNTER — Other Ambulatory Visit: Payer: Self-pay | Admitting: Family Medicine

## 2019-12-02 ENCOUNTER — Other Ambulatory Visit: Payer: Self-pay | Admitting: Family Medicine

## 2019-12-02 NOTE — Telephone Encounter (Signed)
Previous RL patient. Last seen in May. Was due for CPE in August per last note. Routing to admin for appt and routing to provider for possible refill

## 2019-12-02 NOTE — Telephone Encounter (Signed)
° °  Notes to clinic: Patient last seen by Roosvelt Maser Review for refill   Requested Prescriptions  Pending Prescriptions Disp Refills   hydrochlorothiazide (MICROZIDE) 12.5 MG capsule [Pharmacy Med Name: HYDROCHLOROTHIAZIDE 12.5 MG CP] 90 capsule 1    Sig: Take 1 capsule (12.5 mg total) by mouth daily. For further refills need to see provider.      Cardiovascular: Diuretics - Thiazide Passed - 12/02/2019  2:36 AM      Passed - Ca in normal range and within 360 days    Calcium  Date Value Ref Range Status  06/16/2019 9.9 8.7 - 10.2 mg/dL Final          Passed - Cr in normal range and within 360 days    Creatinine, Ser  Date Value Ref Range Status  06/16/2019 0.66 0.57 - 1.00 mg/dL Final          Passed - K in normal range and within 360 days    Potassium  Date Value Ref Range Status  06/16/2019 3.9 3.5 - 5.2 mmol/L Final          Passed - Na in normal range and within 360 days    Sodium  Date Value Ref Range Status  06/16/2019 141 134 - 144 mmol/L Final          Passed - Last BP in normal range    BP Readings from Last 1 Encounters:  06/23/19 138/86          Passed - Valid encounter within last 6 months    Recent Outpatient Visits           5 months ago Essential hypertension, benign   [facility] Particia Nearing, [place]   5 months ago Preop cardiovascular exam   [facility] Roosvelt Maser [place], [place]   5 months ago Essential hypertension, benign   [facility] [place], [place], [place]   1 year ago Essential hypertension, benign   [facility] [place], [place], [place]   1 year ago Abscess of left leg   [facility] Gabriel Cirri, NP

## 2019-12-02 NOTE — Telephone Encounter (Signed)
Pt scheduled cpe 11/11 with Dr Laural Benes

## 2019-12-14 ENCOUNTER — Ambulatory Visit: Payer: Commercial Managed Care - PPO | Admitting: Nurse Practitioner

## 2019-12-15 ENCOUNTER — Encounter: Payer: Commercial Managed Care - PPO | Admitting: Family Medicine

## 2019-12-22 ENCOUNTER — Other Ambulatory Visit: Payer: Self-pay | Admitting: Family Medicine

## 2019-12-22 NOTE — Telephone Encounter (Signed)
Requested medications are due for refill today yes  Requested medications are on the active medication list yes  Last refill 8/15  Future visit scheduled no  Notes to clinic Last OV states to come back in 3 months, no upcoming visit scheduled.

## 2020-01-01 ENCOUNTER — Other Ambulatory Visit: Payer: Self-pay | Admitting: Family Medicine

## 2020-01-01 DIAGNOSIS — I10 Essential (primary) hypertension: Secondary | ICD-10-CM

## 2020-01-01 NOTE — Telephone Encounter (Signed)
Requested Prescriptions  Pending Prescriptions Disp Refills  . atorvastatin (LIPITOR) 20 MG tablet [Pharmacy Med Name: ATORVASTATIN 20 MG TABLET] 10 tablet 0    Sig: TAKE 1 TABLET BY MOUTH EVERY DAY     Cardiovascular:  Antilipid - Statins Failed - 01/01/2020  9:03 AM      Failed - Total Cholesterol in normal range and within 360 days    Cholesterol, Total  Date Value Ref Range Status  09/28/2018 233 (H) 100 - 199 mg/dL Final   Cholesterol Piccolo, Waived  Date Value Ref Range Status  10/13/2014 170 <200 mg/dL Final    Comment:                            Desirable                <200                         Borderline High      200- 239                         High                     >239          Failed - LDL in normal range and within 360 days    LDL Calculated  Date Value Ref Range Status  09/28/2018 136 (H) 0 - 99 mg/dL Final    Comment:    **Effective October 04, 2018, [facility] is implementing an improved** equation to calculate Low Density Lipoprotein Cholesterol (LDL-C) concentrations, to be used in all lipid panels that report calculated LDL-C. This equation was developed through a collaboration with the [employer], [employer] and Blood Institutes of Health (NIH).[1] The NIH calculation overcomes the limitations of the existing Friedewald LDL-C equation and performs equally well in both fasting and non-fasting individuals. 1. Rebbeca Paul Q, et al. A new equation for calculation of low-density lipoprotein cholesterol in patients with normolipidemia and/or hypertriglyceridemia. JAMA Cardiol. 2020 Feb 26. doi:10.1001/jamacardio.2020.0013          Failed - HDL in normal range and within 360 days    HDL  Date Value Ref Range Status  09/28/2018 52 >39 mg/dL Final         Failed - Triglycerides in normal range and within 360 days    Triglycerides  Date Value Ref Range Status  09/28/2018 225 (H) 0 - 149 mg/dL Final   Triglycerides Piccolo,Waived  Date  Value Ref Range Status  10/13/2014 81 <150 mg/dL Final    Comment:                            Normal                   <150                         Borderline High     150 - 199                         High                200 - 499  Very High                >499          Passed - Patient is not pregnant      Passed - Valid encounter within last 12 months    Recent Outpatient Visits          6 months ago Essential hypertension, benign   [facility] Roosvelt Maser [place], [place]   6 months ago Preop cardiovascular exam   [facility] Roosvelt Maser [place], [place]   6 months ago Essential hypertension, benign   [facility] [place], [place], [place]   1 year ago Essential hypertension, benign   [facility] Roosvelt Maser [place], [place]   1 year ago Abscess of left leg   [facility] Gabriel Cirri, NP      Future Appointments            In 1 week Boyde, Megan P, DO [facility]           . atenolol (TENORMIN) 50 MG tablet [Pharmacy Med Name: ATENOLOL 50 MG TABLET] 10 tablet 0    Sig: TAKE 1 TABLET BY MOUTH EVERY DAY     Cardiovascular:  Beta Blockers Failed - 01/01/2020  9:03 AM      Failed - Valid encounter within last 6 months    Recent Outpatient Visits          6 months ago Essential hypertension, benign   [facility] Particia [place], [place]   6 months ago Preop cardiovascular exam   [facility] Roosvelt Maser [place], [place]   6 months ago Essential hypertension, benign   [facility] [place], [place], [place]   1 year ago Essential hypertension, benign   [facility] [place], [place], [place]   1 year ago Abscess of left leg   [facility] Gabriel Cirri, NP      Future Appointments            In 1 week Laural Benes, Megan P, DO [facility]           Passed - Last BP  in normal range    BP Readings from Last 1 Encounters:  06/23/19 138/86         Passed - Last Heart Rate in normal range    Pulse Readings from Last 1 Encounters:  06/23/19 68

## 2020-01-02 ENCOUNTER — Other Ambulatory Visit: Payer: Self-pay | Admitting: Nurse Practitioner

## 2020-01-02 ENCOUNTER — Other Ambulatory Visit: Payer: Self-pay | Admitting: Family Medicine

## 2020-01-03 ENCOUNTER — Other Ambulatory Visit: Payer: Self-pay

## 2020-01-03 MED ORDER — HYDROCHLOROTHIAZIDE 12.5 MG PO CAPS: 12.5000 mg | ORAL_CAPSULE | Freq: Every day | ORAL | 0 refills | Status: DC

## 2020-01-03 NOTE — Telephone Encounter (Signed)
Patient is CPE with Dr. Shela Commons on 12/6

## 2020-01-09 ENCOUNTER — Encounter: Payer: Commercial Managed Care - PPO | Admitting: Family Medicine

## 2020-01-23 ENCOUNTER — Other Ambulatory Visit: Payer: Self-pay

## 2020-01-23 MED ORDER — HYDROCHLOROTHIAZIDE 12.5 MG PO CAPS: 12.5000 mg | ORAL_CAPSULE | Freq: Every day | ORAL | 0 refills | Status: DC

## 2020-01-23 MED ORDER — ATORVASTATIN CALCIUM 20 MG PO TABS
20.0000 mg | ORAL_TABLET | Freq: Every day | ORAL | 0 refills | Status: DC
Start: 2020-01-23 — End: 2020-03-05

## 2020-01-23 NOTE — Telephone Encounter (Signed)
Can this be refilled? 

## 2020-01-23 NOTE — Telephone Encounter (Signed)
Pt scheduled 1/10

## 2020-01-25 ENCOUNTER — Other Ambulatory Visit: Payer: Self-pay | Admitting: Family Medicine

## 2020-01-26 ENCOUNTER — Other Ambulatory Visit: Payer: Self-pay | Admitting: Family Medicine

## 2020-01-26 ENCOUNTER — Other Ambulatory Visit: Payer: Self-pay | Admitting: Nurse Practitioner

## 2020-01-26 DIAGNOSIS — I10 Essential (primary) hypertension: Secondary | ICD-10-CM

## 2020-02-11 ENCOUNTER — Other Ambulatory Visit: Payer: Self-pay | Admitting: Family Medicine

## 2020-02-13 ENCOUNTER — Encounter: Payer: Commercial Managed Care - PPO | Admitting: Family Medicine

## 2020-02-13 NOTE — Telephone Encounter (Signed)
Called to r/s cpe no answer no vm

## 2020-02-13 NOTE — Telephone Encounter (Signed)
Please get rescheduled and I'll send in refills

## 2020-02-13 NOTE — Telephone Encounter (Signed)
Appt was cancelled due to provider schedule

## 2020-02-14 NOTE — Telephone Encounter (Signed)
Unable to Lvm to make apt  

## 2020-02-16 NOTE — Telephone Encounter (Signed)
Unable to Lvm to make apt  

## 2020-02-17 ENCOUNTER — Encounter: Payer: Self-pay | Admitting: Family Medicine

## 2020-02-17 NOTE — Telephone Encounter (Signed)
Unable to Lvm to make apt letter sent

## 2020-02-18 ENCOUNTER — Other Ambulatory Visit: Payer: Self-pay | Admitting: Family Medicine

## 2020-02-18 ENCOUNTER — Other Ambulatory Visit: Payer: Self-pay | Admitting: Nurse Practitioner

## 2020-02-18 DIAGNOSIS — I10 Essential (primary) hypertension: Secondary | ICD-10-CM

## 2020-03-04 ENCOUNTER — Other Ambulatory Visit: Payer: Self-pay | Admitting: Nurse Practitioner

## 2020-03-05 ENCOUNTER — Encounter: Payer: Commercial Managed Care - PPO | Admitting: Family Medicine

## 2020-03-05 ENCOUNTER — Other Ambulatory Visit: Payer: Self-pay

## 2020-03-05 ENCOUNTER — Encounter: Payer: Self-pay | Admitting: Nurse Practitioner

## 2020-03-05 ENCOUNTER — Ambulatory Visit (INDEPENDENT_AMBULATORY_CARE_PROVIDER_SITE_OTHER): Payer: Commercial Managed Care - PPO | Admitting: Nurse Practitioner

## 2020-03-05 VITALS — BP 161/100 | HR 67 | Temp 99.0°F | Ht 63.0 in | Wt 214.4 lb

## 2020-03-05 DIAGNOSIS — E559 Vitamin D deficiency, unspecified: Secondary | ICD-10-CM | POA: Diagnosis not present

## 2020-03-05 DIAGNOSIS — Z0000 Encounter for general adult medical examination without abnormal findings: Secondary | ICD-10-CM | POA: Diagnosis not present

## 2020-03-05 DIAGNOSIS — Z1159 Encounter for screening for other viral diseases: Secondary | ICD-10-CM

## 2020-03-05 DIAGNOSIS — E6609 Other obesity due to excess calories: Secondary | ICD-10-CM

## 2020-03-05 DIAGNOSIS — Z6835 Body mass index (BMI) 35.0-35.9, adult: Secondary | ICD-10-CM

## 2020-03-05 DIAGNOSIS — Z975 Presence of (intrauterine) contraceptive device: Secondary | ICD-10-CM

## 2020-03-05 DIAGNOSIS — E7800 Pure hypercholesterolemia, unspecified: Secondary | ICD-10-CM

## 2020-03-05 DIAGNOSIS — Z1231 Encounter for screening mammogram for malignant neoplasm of breast: Secondary | ICD-10-CM

## 2020-03-05 DIAGNOSIS — Z124 Encounter for screening for malignant neoplasm of cervix: Secondary | ICD-10-CM

## 2020-03-05 DIAGNOSIS — I10 Essential (primary) hypertension: Secondary | ICD-10-CM | POA: Diagnosis not present

## 2020-03-05 DIAGNOSIS — Z131 Encounter for screening for diabetes mellitus: Secondary | ICD-10-CM

## 2020-03-05 DIAGNOSIS — Z23 Encounter for immunization: Secondary | ICD-10-CM

## 2020-03-05 DIAGNOSIS — Z1211 Encounter for screening for malignant neoplasm of colon: Secondary | ICD-10-CM

## 2020-03-05 LAB — URINALYSIS, ROUTINE W REFLEX MICROSCOPIC
Bilirubin, UA: NEGATIVE
Glucose, UA: NEGATIVE
Ketones, UA: NEGATIVE
Leukocytes,UA: NEGATIVE
Nitrite, UA: NEGATIVE
Protein,UA: NEGATIVE
RBC, UA: NEGATIVE
Specific Gravity, UA: 1.025 (ref 1.005–1.030)
Urobilinogen, Ur: 0.2 mg/dL (ref 0.2–1.0)
pH, UA: 5.5 (ref 5.0–7.5)

## 2020-03-05 MED ORDER — IRBESARTAN-HYDROCHLOROTHIAZIDE 300-12.5 MG PO TABS: 1.0000 | ORAL_TABLET | Freq: Every day | ORAL | 1 refills | Status: DC

## 2020-03-05 MED ORDER — HYDRALAZINE HCL 10 MG PO TABS
10.0000 mg | ORAL_TABLET | Freq: Two times a day (BID) | ORAL | 0 refills | Status: DC | PRN
Start: 2020-03-05 — End: 2020-12-05

## 2020-03-05 MED ORDER — AMLODIPINE BESYLATE 10 MG PO TABS: 10.0000 mg | ORAL_TABLET | Freq: Every day | ORAL | 1 refills | Status: DC

## 2020-03-05 MED ORDER — ATENOLOL 50 MG PO TABS: 50.0000 mg | ORAL_TABLET | Freq: Every day | ORAL | 1 refills | Status: DC

## 2020-03-05 MED ORDER — ATORVASTATIN CALCIUM 20 MG PO TABS: 20.0000 mg | ORAL_TABLET | Freq: Every day | ORAL | 1 refills | Status: DC

## 2020-03-05 NOTE — Progress Notes (Addendum)
BP (!) 161/100    Pulse 67    Temp 99 F (37.2 C) (Oral)    Ht 5' 3" (1.6 m)    Wt 214 lb 6.4 oz (97.3 kg)    SpO2 98%    BMI 37.98 kg/m    Subjective:    Patient ID: Rhonda Stanley, female    DOB: 29-Apr-1965, 55 y.o.   MRN: [id number]  HPI: Rhonda Stanley is a 55 y.o. female presenting on 03/05/2020 for comprehensive medical examination. Current medical complaints include:none  She currently lives with: Alone Menopausal Symptoms: sweating sometimes  HYPERTENSION / HYPERLIPIDEMIA Satisfied with current treatment? yes Duration of hypertension: chronic BP monitoring frequency: a few times a week BP range:  BP medication side effects: no Past BP meds: amlodipine, atenolol, HCTZ, irbesartan (avapro), lisinopril, lisinopril-HCTZ and verapamil, hydralazine Duration of hyperlipidemia: chronic Cholesterol medication side effects: yes Cholesterol supplements: none Past cholesterol medications: none Medication compliance: excellent compliance Aspirin: no Recent stressors: yes Recurrent headaches: no Visual changes: no Palpitations: no Dyspnea: no Chest pain: no Lower extremity edema: no Dizzy/lightheaded: no  Functional Status Survey: Is the patient deaf or have difficulty hearing?: No Does the patient have difficulty seeing, even when wearing glasses/contacts?: No Does the patient have difficulty concentrating, remembering, or making decisions?: No Does the patient have difficulty walking or climbing stairs?: No Does the patient have difficulty dressing or bathing?: No Does the patient have difficulty doing errands alone such as visiting a doctor's office or shopping?: No  Fall Risk  03/05/2020 09/27/2018 10/19/2017 09/12/2016 08/28/2015  Falls in the past year? 0 0 Yes No No  Number falls in past yr: 0 0 2 or more - -  Injury with Fall? 0 0 No - -  Risk for fall due to : History of fall(s) - - - -  Follow up Falls evaluation completed - - - -    Depression  Screen Depression screen [facility] 2/9 03/05/2020 06/08/2019 10/19/2017 09/12/2016 08/28/2015  Decreased Interest 0 0 0 1 0  Down, Depressed, Hopeless 0 0 0 0 0  PHQ - 2 Score 0 0 0 1 0  Altered sleeping - - 1 1 -  Tired, decreased energy - - 1 0 -  Change in appetite - - 1 0 -  Feeling bad or failure about yourself  - - 0 0 -  Trouble concentrating - - 0 0 -  Moving slowly or fidgety/restless - - 0 0 -  Suicidal thoughts - - 0 0 -  PHQ-9 Score - - 3 2 -     Advanced Directives Does patient have a HCPOA?    no If yes, name and contact information:  Does patient have a living will or MOST form?  no  Past Medical History:  Past Medical History:  Diagnosis Date   Hypertension     Surgical History:  Past Surgical History:  Procedure Laterality Date   SHOULDER SURGERY Right 2011    Medications:  Current Outpatient Medications on File Prior to Visit  Medication Sig   hydrochlorothiazide (MICROZIDE) 12.5 MG capsule TAKE 1 CAPSULE (12.5 MG TOTAL) BY MOUTH DAILY. FOR FURTHER REFILLS NEED TO SEE PROVIDER.   PARAGARD INTRAUTERINE COPPER IUD IUD 1 each by Intrauterine route once.   No current facility-administered medications on file prior to visit.    Allergies:  No Known Allergies  Social History:  Social History   Socioeconomic History   Marital status: Married    Spouse name: Not  on file   Number of children: Not on file   Years of education: Not on file   Highest education level: Not on file  Occupational History   Occupation: [employer]  Tobacco Use   Smoking status: Never Smoker   Smokeless tobacco: Never Used  Vaping Use   Vaping Use: Never used  Substance and Sexual Activity   Alcohol use: Yes    Alcohol/week: 3.0 standard drinks    Types: 3 Standard drinks or equivalent per week    Comment: on occasion   Drug use: No   Sexual activity: Yes    Partners: Male    Birth control/protection: I.U.D.  Other Topics Concern   Not on file  Social History  Narrative   Not on file   Social Determinants of Health   Financial Resource Strain: Not on file  Food Insecurity: Not on file  Transportation Needs: Not on file  Physical Activity: Not on file  Stress: Not on file  Social Connections: Not on file  Intimate Partner Violence: Not on file   Social History   Tobacco Use  Smoking Status Never Smoker  Smokeless Tobacco Never Used   Social History   Substance and Sexual Activity  Alcohol Use Yes   Alcohol/week: 3.0 standard drinks   Types: 3 Standard drinks or equivalent per week   Comment: on occasion    Family History:  Family History  Problem Relation Age of Onset   Hypertension Mother    Hypertension Father     Past medical history, surgical history, medications, allergies, family history and social history reviewed with patient today and changes made to appropriate areas of the chart.   Review of Systems  All other systems reviewed and are negative.   All other ROS negative except what is listed above and in the HPI.      Objective:    BP (!) 161/100    Pulse 67    Temp 99 F (37.2 C) (Oral)    Ht 5' 3" (1.6 m)    Wt 214 lb 6.4 oz (97.3 kg)    SpO2 98%    BMI 37.98 kg/m   Wt Readings from Last 3 Encounters:  03/05/20 214 lb 6.4 oz (97.3 kg)  06/23/19 202 lb (91.6 kg)  06/16/19 202 lb 8 oz (91.9 kg)    Physical Exam Vitals and nursing note reviewed. Exam conducted with a chaperone present.  Constitutional:      General: She is awake. She is not in acute distress.    Appearance: She is well-developed. She is obese. She is not ill-appearing.  HENT:     Head: Normocephalic and atraumatic.     Right Ear: Hearing, tympanic membrane, ear canal and external ear normal. No drainage.     Left Ear: Hearing, tympanic membrane, ear canal and external ear normal. No drainage.     Nose: Nose normal.     Right Sinus: No maxillary sinus tenderness or frontal sinus tenderness.     Left Sinus: No maxillary sinus  tenderness or frontal sinus tenderness.     Mouth/Throat:     Mouth: Mucous membranes are moist.     Pharynx: Oropharynx is clear. Uvula midline. No pharyngeal swelling, oropharyngeal exudate or posterior oropharyngeal erythema.  Eyes:     General: Lids are normal.        Right eye: No discharge.        Left eye: No discharge.     Extraocular Movements: Extraocular movements intact.  Conjunctiva/sclera: Conjunctivae normal.     Pupils: Pupils are equal, round, and reactive to light.     Visual Fields: Right eye visual fields normal and left eye visual fields normal.  Neck:     Thyroid: No thyromegaly.     Vascular: No carotid bruit.     Trachea: Trachea normal.  Cardiovascular:     Rate and Rhythm: Normal rate and regular rhythm.     Heart sounds: Normal heart sounds. No murmur heard. No gallop.   Pulmonary:     Effort: Pulmonary effort is normal. No accessory muscle usage or respiratory distress.     Breath sounds: Normal breath sounds.  Chest:  Breasts:     Right: Normal. No axillary adenopathy or supraclavicular adenopathy.     Left: Normal. No axillary adenopathy or supraclavicular adenopathy.    Abdominal:     General: Bowel sounds are normal.     Palpations: Abdomen is soft. There is no hepatomegaly or splenomegaly.     Tenderness: There is no abdominal tenderness.  Genitourinary:    Pubic Area: No rash or pubic lice.      Vagina: Normal.     Cervix: Normal.     Adnexa: Right adnexa normal and left adnexa normal.  Musculoskeletal:        General: Normal range of motion.     Cervical back: Normal range of motion and neck supple.     Right lower leg: No edema.     Left lower leg: No edema.  Lymphadenopathy:     Head:     Right side of head: No submental, submandibular, tonsillar, preauricular or posterior auricular adenopathy.     Left side of head: No submental, submandibular, tonsillar, preauricular or posterior auricular adenopathy.     Cervical: No cervical  adenopathy.     Upper Body:     Right upper body: No supraclavicular, axillary or pectoral adenopathy.     Left upper body: No supraclavicular, axillary or pectoral adenopathy.  Skin:    General: Skin is warm and dry.     Capillary Refill: Capillary refill takes less than 2 seconds.     Findings: No rash.  Neurological:     Mental Status: She is alert and oriented to person, place, and time.     Cranial Nerves: Cranial nerves are intact.     Gait: Gait is intact.     Deep Tendon Reflexes: Reflexes are normal and symmetric.     Reflex Scores:      Brachioradialis reflexes are 2+ on the right side and 2+ on the left side.      Patellar reflexes are 2+ on the right side and 2+ on the left side. Psychiatric:        Attention and Perception: Attention normal.        Mood and Affect: Mood normal.        Speech: Speech normal.        Behavior: Behavior normal. Behavior is cooperative.        Thought Content: Thought content normal.        Judgment: Judgment normal.     Results for orders placed or performed in visit on 03/05/20  Comp Met (CMET)  Result Value Ref Range   Glucose 99 65 - 99 mg/dL   BUN 13 6 - 24 mg/dL   Creatinine, Ser 0.67 0.57 - 1.00 mg/dL   GFR calc non Af Amer 101 >59 mL/min/1.73   GFR calc Af Amer 117 >  59 mL/min/1.73   BUN/Creatinine Ratio 19 9 - 23   Sodium 142 134 - 144 mmol/L   Potassium 4.3 3.5 - 5.2 mmol/L   Chloride 102 96 - 106 mmol/L   CO2 27 20 - 29 mmol/L   Calcium 9.9 8.7 - 10.2 mg/dL   Total Protein 7.7 6.0 - 8.5 g/dL   Albumin 4.3 3.8 - 4.9 g/dL   Globulin, Total 3.4 1.5 - 4.5 g/dL   Albumin/Globulin Ratio 1.3 1.2 - 2.2   Bilirubin Total 0.6 0.0 - 1.2 mg/dL   Alkaline Phosphatase 121 44 - 121 IU/L   AST 23 0 - 40 IU/L   ALT 25 0 - 32 IU/L  Lipid Profile  Result Value Ref Range   Cholesterol, Total 220 (H) 100 - 199 mg/dL   Triglycerides 131 0 - 149 mg/dL   HDL 59 >39 mg/dL   VLDL Cholesterol Cal 23 5 - 40 mg/dL   LDL Chol Calc (NIH) 138  (H) 0 - 99 mg/dL   Chol/HDL Ratio 3.7 0.0 - 4.4 ratio  Vitamin D (25 hydroxy)  Result Value Ref Range   Vit D, 25-Hydroxy 15.2 (L) 30.0 - 100.0 ng/mL  TSH  Result Value Ref Range   TSH 1.360 0.450 - 4.500 uIU/mL  Urinalysis, Routine w reflex microscopic  Result Value Ref Range   Specific Gravity, UA 1.025 1.005 - 1.030   pH, UA 5.5 5.0 - 7.5   Color, UA Yellow Yellow   Appearance Ur Hazy (A) Clear   Leukocytes,UA Negative Negative   Protein,UA Negative Negative/Trace   Glucose, UA Negative Negative   Ketones, UA Negative Negative   RBC, UA Negative Negative   Bilirubin, UA Negative Negative   Urobilinogen, Ur 0.2 0.2 - 1.0 mg/dL   Nitrite, UA Negative Negative  CBC w/Diff  Result Value Ref Range   WBC 8.5 3.4 - 10.8 x10E3/uL   RBC 4.84 3.77 - 5.28 x10E6/uL   Hemoglobin 13.1 11.1 - 15.9 g/dL   Hematocrit 40.2 34.0 - 46.6 %   MCV 83 79 - 97 fL   MCH 27.1 26.6 - 33.0 pg   MCHC 32.6 31.5 - 35.7 g/dL   RDW 14.1 11.7 - 15.4 %   Platelets 359 150 - 450 x10E3/uL   Neutrophils 72 Not Estab. %   Lymphs 18 Not Estab. %   Monocytes 7 Not Estab. %   Eos 2 Not Estab. %   Basos 0 Not Estab. %   Neutrophils Absolute 6.2 1.4 - 7.0 x10E3/uL   Lymphocytes Absolute 1.5 0.7 - 3.1 x10E3/uL   Monocytes Absolute 0.6 0.1 - 0.9 x10E3/uL   EOS (ABSOLUTE) 0.1 0.0 - 0.4 x10E3/uL   Basophils Absolute 0.0 0.0 - 0.2 x10E3/uL   Immature Granulocytes 1 Not Estab. %   Immature Grans (Abs) 0.0 0.0 - 0.1 x10E3/uL  Hepatitis C Antibody  Result Value Ref Range   Hep C Virus Ab <0.1 0.0 - 0.9 s/co ratio  HgB A1c  Result Value Ref Range   Hgb A1c MFr Bld 5.9 (H) 4.8 - 5.6 %   Est. average glucose Bld gHb Est-mCnc 123 mg/dL      Assessment & Plan:   Problem List Items Addressed This Visit      Cardiovascular and Mediastinum   Essential hypertension, benign    Chronic.  Patient's blood pressure has been well controlled at home. Return to clinic if BP is greater than 140/90.  Follow up in 6 months  for routine BP check.  Relevant Medications   amLODipine (NORVASC) 10 MG tablet   atenolol (TENORMIN) 50 MG tablet   atorvastatin (LIPITOR) 20 MG tablet   irbesartan-hydrochlorothiazide (AVALIDE) 300-12.5 MG tablet   hydrALAZINE (APRESOLINE) 10 MG tablet   Other Relevant Orders   Comp Met (CMET) (Completed)     Other   Hypercholesterolemia    Chronic.  Continue with low fat diet and cholesterol medication.  Return in 6 months for repeat lab work.      Relevant Medications   amLODipine (NORVASC) 10 MG tablet   atenolol (TENORMIN) 50 MG tablet   atorvastatin (LIPITOR) 20 MG tablet   irbesartan-hydrochlorothiazide (AVALIDE) 300-12.5 MG tablet   hydrALAZINE (APRESOLINE) 10 MG tablet   Other Relevant Orders   Lipid Profile (Completed)   Obesity    Recommend regular exercise and a diet low in fat and carbohydrates to aid in weight loss.      Vitamin D deficiency    Vitamin D levels drawn today.  Patient continues on Vitamin D supplement.      Relevant Medications   Vitamin D, Ergocalciferol, (DRISDOL) 1.25 MG (50000 UNIT) CAPS capsule   Other Relevant Orders   Vitamin D (25 hydroxy) (Completed)    Other Visit Diagnoses    Annual physical exam    -  Primary   Preventative maintenance discussed with patient during visit today.  Mammo, Cologuard, Hep C, flu shot, and PAP ordered at visit today.  Labs ordered at visit.   Relevant Orders   Comp Met (CMET) (Completed)   Lipid Profile (Completed)   TSH (Completed)   Urinalysis, Routine w reflex microscopic (Completed)   CBC w/Diff (Completed)   Encounter for screening mammogram for malignant neoplasm of breast       Ordered at visit today.    Relevant Orders   MM Digital Screening   Encounter for hepatitis C screening test for low risk patient       Ordered at visit today.    Relevant Orders   Hepatitis C Antibody (Completed)   Screening for diabetes mellitus       Ordered at visit today.    Relevant Orders   HgB A1c  (Completed)   Screening for cervical cancer       PAP specimen obtaind in normal fashion.  Escorted by Yvonna Alanis, [place].  Patient tolerated obtaining of specimen without complication.   Relevant Orders   Cytology - non gyn   Screening for colon cancer       Cologuard discussed with patient for appropriate colon cancer screen.  Ordered during  visit today.    Relevant Orders   Cologuard   Need for influenza vaccination       Patient tolerated injectoin well.    Relevant Orders   Flu Vaccine QUAD 36+ mos IM (Completed)   IUD (intrauterine device) in place       Patient does not want to have a period. IUD has been in place more than 10 years.  Discussed the importance of removal.  Can discuss options with GYN.   Relevant Orders   Ambulatory referral to Gynecology       Preventative Services:  Health Risk Assessment and Personalized Prevention Plan: Breast Cancer Screening: Ordered at visit today Cervical Cancer Screening: Colon Cancer Screening: Ordered at visit today Depression Screening: 03/05/2020 Diabetes Screening: 03/05/2020 Hepatitis B vaccine: Hepatitis C screening: 03/05/2020 HIV Screening: 08/28/2015 Flu Vaccine: Obesity Screening: 03/05/2020 Pneumonia Vaccines (2): NA  Follow up plan: Return in about 6  months (around 09/02/2020) for Cholesterol and BP check (fasting).   LABORATORY TESTING:  - Pap smear: pap done today.  Discussed with patient that if IUD is more than 53 years old it should be removed.  Patient is concerned about having a period again.  Advised patient to discuss with GYN options after removal of IUD.  IMMUNIZATIONS:   - Tdap: Tetanus vaccination status reviewed: last tetanus booster within 10 years. - Influenza: 03/05/2020 - Pneumovax: NA - Prevnar: Not applicable  - COVID- due for booster.  Discussed at visit today.   SCREENING: -Mammogram: Ordered today  - Colonoscopy: Ordered today  - Bone Density: Not applicable  -Spirometry: Not  applicable   PATIENT COUNSELING:   Advised to take 1 mg of folate supplement per day if capable of pregnancy.   Sexuality: Discussed sexually transmitted diseases, partner selection, use of condoms, avoidance of unintended pregnancy  and contraceptive alternatives.   Advised to avoid cigarette smoking.  I discussed with the patient that most people either abstain from alcohol or drink within safe limits (<=14/week and <=4 drinks/occasion for males, <=7/weeks and <= 3 drinks/occasion for females) and that the risk for alcohol disorders and other health effects rises proportionally with the number of drinks per week and how often a drinker exceeds daily limits.  Discussed cessation/primary prevention of drug use and availability of treatment for abuse.   Diet: Encouraged to adjust caloric intake to maintain  or achieve ideal body weight, to reduce intake of dietary saturated fat and total fat, to limit sodium intake by avoiding high sodium foods and not adding table salt, and to maintain adequate dietary potassium and calcium preferably from fresh fruits, vegetables, and low-fat dairy products.    stressed the importance of regular exercise  Injury prevention: Discussed safety belts, safety helmets, smoke detector, smoking near bedding or upholstery.   Dental health: Discussed importance of regular tooth brushing, flossing, and dental visits.    NEXT PREVENTATIVE PHYSICAL DUE IN 1 YEAR. Return in about 6 months (around 09/02/2020) for Cholesterol and BP check (fasting).

## 2020-03-05 NOTE — Patient Instructions (Signed)
https://www.cancer.org/cancer/colon-rectal-cancer/causes-risks-prevention/risk-factors.html">  Colorectal Cancer Screening  Colorectal cancer screening is a group of tests that are used to check for colorectal cancer before symptoms develop. Colorectal refers to the colon and rectum. The colon and rectum are located at the end of the digestive tract and carry stool (feces) out of the body. Who should have screening? All adults who are 45-53 years old should have screening. Your health care provider may recommend screening before age 45. You will have tests every 1-10 years, depending on your results and the type of screening test. Screening recommendations for adults who are 76-85 years old vary depending on a person's health. People older than age 85 should no longer get colorectal cancer screening. You may have screening tests starting before age 45, or more often than other people, if you have any of these risk factors:  A personal or family history of colorectal cancer or abnormal growths known as polyps in your colon.  Inflammatory bowel disease, such as ulcerative colitis or Crohn's disease.  A history of having radiation treatment to the abdomen or the area between the hip bones (pelvic area) for cancer.  A type of genetic syndrome that is passed from parent to child (hereditary), such as: ? Lynch syndrome. ? Familial adenomatous polyposis. ? Turcot syndrome. ? Peutz-Jeghers syndrome. ? MUTYH-associated polyposis (MAP).  A personal history of diabetes. Types of tests There are several types of colorectal screening tests. You may have one or more of the following:  Guaiac-based fecal occult blood testing. For this test, a stool sample is checked for hidden (occult) blood, which could be a sign of colorectal cancer.  Fecal immunochemical test (FIT). For this test, a stool sample is checked for blood, which could be a sign of colorectal cancer.  Stool DNA test. For this test, a stool  sample is checked for blood and changes in DNA that could lead to colorectal cancer.  Sigmoidoscopy. During this test, a thin, flexible tube with a camera on the end, called a sigmoidoscope, is used to examine the rectum and the lower colon.  Colonoscopy. During this test, a long, flexible tube with a camera on the end, called a colonoscope, is used to examine the entire colon and rectum. Also, sometimes a tissue sample is taken to be looked at under a microscope (biopsy) or small polyps are removed during this test.  Virtual colonoscopy. Instead of a colonoscope, this type of colonoscopy uses a CT scan to take pictures of the colon and rectum. A CT scan is a type of X-ray that is made using computers. What are the benefits of screening? Screening reduces your risk for colorectal cancer and can help identify cancer at an early stage, when the cancer can be removed or treated more easily. It is common for polyps to form in the lining of the colon, especially as you age. These polyps may be cancerous or become cancerous over time. Screening can identify these polyps. What are the risks of screening? Generally, these are safe tests. However, problems may occur, including:  The need for more tests to confirm results from a stool sample test. Stool sample tests have fewer risks than other types of screening tests.  Being exposed to low levels of radiation, if you had a test involving X-rays. This may slightly increase your cancer risk. The benefit of detecting cancer outweighs the slight increase in risk.  Bleeding, damage to the intestine, or infection caused by a sigmoidoscopy or colonoscopy.  A reaction to medicines given   during a sigmoidoscopy or colonoscopy. Talk with your health care provider to understand your risk for colorectal cancer and to make a screening plan that is right for you. Questions to ask your health care provider  When should I start colorectal cancer screening?  What is my  risk for colorectal cancer?  How often do I need screening?  Which screening tests do I need?  How do I get my test results?  What do my results mean? Where to find more information Learn more about colorectal cancer screening from:  The American Cancer Society: cancer.org  National Cancer Institute: cancer.gov Summary  Colorectal cancer screening is a group of tests used to check for colorectal cancer before symptoms develop.  All adults who are 45-53 years old should have screening. Your health care provider may recommend screening before age 45.  You may have screening tests starting before age 45, or more often than other people, if you have certain risk factors.  Screening reduces your risk for colorectal cancer and can help identify cancer at an early stage, when the cancer can be removed or treated more easily.  Talk with your health care provider to understand your risk for colorectal cancer and to make a screening plan that is right for you. This information is not intended to replace advice given to you by your health care provider. Make sure you discuss any questions you have with your health care provider. Document Revised: 05/11/2019 Document Reviewed: 05/11/2019 Elsevier Patient Education  2021 Elsevier Inc.  

## 2020-03-06 ENCOUNTER — Telehealth: Payer: Self-pay

## 2020-03-06 DIAGNOSIS — Z124 Encounter for screening for malignant neoplasm of cervix: Secondary | ICD-10-CM

## 2020-03-06 LAB — LIPID PANEL
Chol/HDL Ratio: 3.7 ratio (ref 0.0–4.4)
Cholesterol, Total: 220 mg/dL — ABNORMAL HIGH (ref 100–199)
HDL: 59 mg/dL (ref >39)
LDL Chol Calc (NIH): 138 mg/dL — ABNORMAL HIGH (ref 0–99)
Triglycerides: 131 mg/dL (ref 0–149)
VLDL Cholesterol Cal: 23 mg/dL (ref 5–40)

## 2020-03-06 LAB — CBC WITH DIFFERENTIAL/PLATELET
Basophils Absolute: 0 10*3/uL (ref 0.0–0.2)
Basos: 0 %
EOS (ABSOLUTE): 0.1 10*3/uL (ref 0.0–0.4)
Eos: 2 %
Hematocrit: 40.2 % (ref 34.0–46.6)
Hemoglobin: 13.1 g/dL (ref 11.1–15.9)
Immature Grans (Abs): 0 10*3/uL (ref 0.0–0.1)
Immature Granulocytes: 1 %
Lymphocytes Absolute: 1.5 10*3/uL (ref 0.7–3.1)
Lymphs: 18 %
MCH: 27.1 pg (ref 26.6–33.0)
MCHC: 32.6 g/dL (ref 31.5–35.7)
MCV: 83 fL (ref 79–97)
Monocytes Absolute: 0.6 10*3/uL (ref 0.1–0.9)
Monocytes: 7 %
Neutrophils Absolute: 6.2 10*3/uL (ref 1.4–7.0)
Neutrophils: 72 %
Platelets: 359 10*3/uL (ref 150–450)
RBC: 4.84 x10E6/uL (ref 3.77–5.28)
RDW: 14.1 % (ref 11.7–15.4)
WBC: 8.5 10*3/uL (ref 3.4–10.8)

## 2020-03-06 LAB — COMPREHENSIVE METABOLIC PANEL
ALT: 25 IU/L (ref 0–32)
AST: 23 IU/L (ref 0–40)
Albumin/Globulin Ratio: 1.3 (ref 1.2–2.2)
Albumin: 4.3 g/dL (ref 3.8–4.9)
Alkaline Phosphatase: 121 IU/L (ref 44–121)
BUN/Creatinine Ratio: 19 (ref 9–23)
BUN: 13 mg/dL (ref 6–24)
Bilirubin Total: 0.6 mg/dL (ref 0.0–1.2)
CO2: 27 mmol/L (ref 20–29)
Calcium: 9.9 mg/dL (ref 8.7–10.2)
Chloride: 102 mmol/L (ref 96–106)
Creatinine, Ser: 0.67 mg/dL (ref 0.57–1.00)
GFR calc Af Amer: 117 mL/min/{1.73_m2} (ref >59)
GFR calc non Af Amer: 101 mL/min/{1.73_m2} (ref >59)
Globulin, Total: 3.4 g/dL (ref 1.5–4.5)
Glucose: 99 mg/dL (ref 65–99)
Potassium: 4.3 mmol/L (ref 3.5–5.2)
Sodium: 142 mmol/L (ref 134–144)
Total Protein: 7.7 g/dL (ref 6.0–8.5)

## 2020-03-06 LAB — HEPATITIS C ANTIBODY: Hep C Virus Ab: <0.1 s/co ratio (ref 0.0–0.9)

## 2020-03-06 LAB — HEMOGLOBIN A1C
Est. average glucose Bld gHb Est-mCnc: 123 mg/dL
Hgb A1c MFr Bld: 5.9 % — ABNORMAL HIGH (ref 4.8–5.6)

## 2020-03-06 LAB — TSH: TSH: 1.36 u[IU]/mL (ref 0.450–4.500)

## 2020-03-06 LAB — VITAMIN D 25 HYDROXY (VIT D DEFICIENCY, FRACTURES): Vit D, 25-Hydroxy: 15.2 ng/mL — ABNORMAL LOW (ref 30.0–100.0)

## 2020-03-06 MED ORDER — VITAMIN D (ERGOCALCIFEROL) 1.25 MG (50000 UNIT) PO CAPS: 50000.0000 [IU] | ORAL_CAPSULE | ORAL | 1 refills | Status: DC

## 2020-03-06 NOTE — Addendum Note (Signed)
Addended by: Larae Grooms on: 03/06/2020 08:59 AM   Modules accepted: Orders

## 2020-03-06 NOTE — Telephone Encounter (Signed)
Copied from CRM [id number]. Topic: General - Other >> Mar 06, 2020  3:10 PM Cuthrell, Pearlean Brownie wrote: Reason for CRM: Gennine called from [facility] Cytology dept. On behalf of patient stated  Cytology - non gyn (Order [id number]) order was incorrect and should be a cytology- pap  C/B #: [phone] Gennine leaves at 4pm today stated can speak with anyone in her dept in regards to matter.

## 2020-03-06 NOTE — Telephone Encounter (Signed)
Patient is scheduled for mirena replacement on 03/13/20 at 8:10 with KV

## 2020-03-07 NOTE — Telephone Encounter (Signed)
Spoke with [facility] in Cytology.  Updated order placed.

## 2020-03-08 NOTE — Assessment & Plan Note (Signed)
Chronic.  Continue with low fat diet and cholesterol medication.  Return in 6 months for repeat lab work.

## 2020-03-08 NOTE — Assessment & Plan Note (Signed)
Recommend regular exercise and a diet low in fat and carbohydrates to aid in weight loss.

## 2020-03-08 NOTE — Telephone Encounter (Signed)
Noted. Will order to arrive by apt date/time. 

## 2020-03-08 NOTE — Assessment & Plan Note (Signed)
Chronic.  Patient's blood pressure has been well controlled at home. Return to clinic if BP is greater than 140/90.  Follow up in 6 months for routine BP check.

## 2020-03-08 NOTE — Assessment & Plan Note (Signed)
Vitamin D levels drawn today.  Patient continues on Vitamin D supplement.

## 2020-03-13 ENCOUNTER — Encounter: Payer: Commercial Managed Care - PPO | Admitting: Obstetrics and Gynecology

## 2020-03-13 ENCOUNTER — Other Ambulatory Visit: Payer: Self-pay

## 2020-03-13 LAB — CYTOLOGY - PAP: Adequacy: ABNORMAL

## 2020-03-14 MED ORDER — HYDROCHLOROTHIAZIDE 12.5 MG PO CAPS: 12.5000 mg | ORAL_CAPSULE | Freq: Every day | ORAL | 1 refills | Status: DC

## 2020-03-14 NOTE — Telephone Encounter (Signed)
Routing to provider  

## 2020-03-14 NOTE — Telephone Encounter (Signed)
Hi Ellise.  The PAP specimen was not a great sample and therefore they were not able to adequately look for abnormal cells.  Since you will be seeing GYN about your IUD, I recommend they repeat your PAP for evaluation.

## 2020-03-27 NOTE — Telephone Encounter (Signed)
03/13/20 apt was cancelled d/t provider not in office. No r/s at this time.

## 2020-04-06 ENCOUNTER — Other Ambulatory Visit: Payer: Self-pay | Admitting: Nurse Practitioner

## 2020-09-03 ENCOUNTER — Ambulatory Visit: Payer: Commercial Managed Care - PPO | Admitting: Nurse Practitioner

## 2020-09-09 ENCOUNTER — Other Ambulatory Visit: Payer: Self-pay | Admitting: Nurse Practitioner

## 2020-09-09 DIAGNOSIS — E7800 Pure hypercholesterolemia, unspecified: Secondary | ICD-10-CM

## 2020-09-09 DIAGNOSIS — I10 Essential (primary) hypertension: Secondary | ICD-10-CM

## 2020-09-09 NOTE — Telephone Encounter (Signed)
Pt past due for appointment. Pt due for refills. Pt was given courtesy refill previous refill.  Last RF atenolol, amlodipine and atorvastatin: 03/05/20 No upcoming appt scheduled. Sent pt MyChart message to pt to make appt.  Requested Prescriptions  Pending Prescriptions Disp Refills   atenolol (TENORMIN) 50 MG tablet [Pharmacy Med Name: ATENOLOL 50 MG TABLET] 90 tablet 1    Sig: TAKE 1 TABLET BY MOUTH EVERY DAY      Cardiovascular:  Beta Blockers Failed - 09/09/2020  9:23 AM      Failed - Last BP in normal range    BP Readings from Last 1 Encounters:  03/05/20 (!) 161/100          Failed - Valid encounter within last 6 months    Recent Outpatient Visits           6 months ago Annual physical exam   [facility] Larae Grooms, NP   1 year ago Essential hypertension, benign   [facility] [place], [place], [place]   1 year ago Preop cardiovascular exam   [facility] Particia Nearing, [place]   1 year ago Essential hypertension, benign   [facility] [place], [place], [place]   1 year ago Essential hypertension, benign   [facility] Roosvelt Maser [place], [place]                Passed - Last Heart Rate in normal range    Pulse Readings from Last 1 Encounters:  03/05/20 67            amLODipine (NORVASC) 10 MG tablet [Pharmacy Med Name: AMLODIPINE BESYLATE 10 MG TAB] 90 tablet 1    Sig: TAKE 1 TABLET BY MOUTH EVERY DAY      Cardiovascular:  Calcium Channel Blockers Failed - 09/09/2020  9:23 AM      Failed - Last BP in normal range    BP Readings from Last 1 Encounters:  03/05/20 (!) 161/100          Failed - Valid encounter within last 6 months    Recent Outpatient Visits           6 months ago Annual physical exam   [facility] Larae Grooms, NP   1 year ago Essential hypertension, benign   [facility] Particia [place]earing, [place]   1 year ago Preop  cardiovascular exam   [facility] Particia [place], [place]   1 year ago Essential hypertension, benign   [facility] [place], [place], [place]   1 year ago Essential hypertension, benign   [facility] [place], [place], [place]                  atorvastatin (LIPITOR) 20 MG tablet [Pharmacy Med Name: ATORVASTATIN 20 MG TABLET] 90 tablet 1    Sig: TAKE 1 TABLET BY MOUTH DAILY. TAKE 1 TABLET BY MOUTH EVERY DAY.      Cardiovascular:  Antilipid - Statins Failed - 09/09/2020  9:23 AM      Failed - Total Cholesterol in normal range and within 360 days    Cholesterol, Total  Date Value Ref Range Status  03/05/2020 220 (H) 100 - 199 mg/dL Final   Cholesterol Piccolo, Waived  Date Value Ref Range Status  10/13/2014 170 <200 mg/dL Final    Comment:  Desirable                <200                         Borderline High      200- 239                         High                     >239           Failed - LDL in normal range and within 360 days    LDL Chol Calc (NIH)  Date Value Ref Range Status  03/05/2020 138 (H) 0 - 99 mg/dL Final          Passed - HDL in normal range and within 360 days    HDL  Date Value Ref Range Status  03/05/2020 59 >39 mg/dL Final          Passed - Triglycerides in normal range and within 360 days    Triglycerides  Date Value Ref Range Status  03/05/2020 131 0 - 149 mg/dL Final   Triglycerides Piccolo,Waived  Date Value Ref Range Status  10/13/2014 81 <150 mg/dL Final    Comment:                            Normal                   <150                         Borderline High     150 - 199                         High                200 - 499                         Very High                >499           Passed - Patient is not pregnant      Passed - Valid encounter within last 12 months    Recent Outpatient Visits           6 months ago Annual physical exam    [facility] Larae Grooms, NP   1 year ago Essential hypertension, benign   [facility] [place], [place]ws, [place]   1 year ago Preop cardiovascular exam   [facility] Particia Nearing, [place]   1 year ago Essential hypertension, benign   [facility] [place], [place], [place]   1 year ago Essential hypertension, benign   [facility] [place], [place], [place]

## 2020-09-10 NOTE — Telephone Encounter (Signed)
Patient given 30 day supply to make an appointment.

## 2020-09-25 ENCOUNTER — Other Ambulatory Visit: Payer: Self-pay | Admitting: Nurse Practitioner

## 2020-09-25 DIAGNOSIS — I10 Essential (primary) hypertension: Secondary | ICD-10-CM

## 2020-09-25 NOTE — Telephone Encounter (Signed)
Requested medication (s) are due for refill today: yes  Requested medication (s) are on the active medication list: yes  Last refill:  03/05/20 #90 1 RF  Future visit scheduled: no  Notes to clinic:  called pt and LM on VM to call [facility] for OV- overdue lab work as well   Requested Prescriptions  Pending Prescriptions Disp Refills   irbesartan-hydrochlorothiazide (AVALIDE) 300-12.5 MG tablet [Pharmacy Med Name: IRBESARTAN-HCTZ 300-12.5 MG TB] 90 tablet 1    Sig: TAKE 1 TABLET BY MOUTH EVERY DAY     Cardiovascular: ARB + Diuretic Combos Failed - 09/25/2020  3:49 PM      Failed - K in normal range and within 180 days    Potassium  Date Value Ref Range Status  03/05/2020 4.3 3.5 - 5.2 mmol/L Final          Failed - Na in normal range and within 180 days    Sodium  Date Value Ref Range Status  03/05/2020 142 134 - 144 mmol/L Final          Failed - Cr in normal range and within 180 days    Creatinine, Ser  Date Value Ref Range Status  03/05/2020 0.67 0.57 - 1.00 mg/dL Final          Failed - Ca in normal range and within 180 days    Calcium  Date Value Ref Range Status  03/05/2020 9.9 8.7 - 10.2 mg/dL Final          Failed - Last BP in normal range    BP Readings from Last 1 Encounters:  03/05/20 (!) 161/100          Failed - Valid encounter within last 6 months    Recent Outpatient Visits           6 months ago Annual physical exam   [facility]ae Grooms, NP   1 year ago Essential hypertension, benign   [facility] Particia Nearing, [place]   1 year ago Preop cardiovascular exam   [facility] Particia Nearing, [place]   1 year ago Essential hypertension, benign   [facility] [place], [place], [place]   1 year ago Essential hypertension, benign   [facility] Roosvelt Maser [place], [place]              Passed - Patient is not pregnant

## 2020-09-25 NOTE — Telephone Encounter (Signed)
Patient is overdue for appointment, please call to schedule.  

## 2020-09-26 NOTE — Telephone Encounter (Signed)
Left message and mychart message sent to patient to schedule appointment.

## 2020-09-27 NOTE — Telephone Encounter (Signed)
Called patient to schedule an appt, no answer, left a voicemail for patient to return my call.

## 2020-10-05 ENCOUNTER — Other Ambulatory Visit: Payer: Self-pay | Admitting: Nurse Practitioner

## 2020-10-05 DIAGNOSIS — I10 Essential (primary) hypertension: Secondary | ICD-10-CM

## 2020-10-05 DIAGNOSIS — E7800 Pure hypercholesterolemia, unspecified: Secondary | ICD-10-CM

## 2020-10-05 NOTE — Telephone Encounter (Signed)
Requested medications are due for refill today yes  Requested medications are on the active medication list yes  Last refill 8/8 for 30 day supply  Last visit 03/05/20  Future visit scheduled no, canceled 8/1 appt.  Notes to clinic Has already had a curtesy refill and there is no upcoming appointment scheduled.

## 2020-10-05 NOTE — Telephone Encounter (Signed)
Requested medication (s) are due for refill today- yes  Requested medication (s) are on the active medication list -yes  Future visit scheduled -no  Last refill: 09/09/20  Notes to clinic: Request RF: Attempted to call patient to schedule appointment- left message to call back. Patient was given courtesy RF last fill- sent for review of request  Requested Prescriptions  Pending Prescriptions Disp Refills   atenolol (TENORMIN) 50 MG tablet [Pharmacy Med Name: ATENOLOL 50 MG TABLET] 90 tablet 1    Sig: TAKE 1 TABLET BY MOUTH EVERY DAY     Cardiovascular:  Beta Blockers Failed - 10/05/2020  7:40 AM      Failed - Last BP in normal range    BP Readings from Last 1 Encounters:  03/05/20 (!) 161/100          Failed - Valid encounter within last 6 months    Recent Outpatient Visits           7 months ago Annual physical exam   [facility] Grooms, NP   1 year ago Essential hypertension, benign   [facility], [place], [place]   1 year ago Preop cardiovascular exam   [facility] Particia Nearing, [place]   1 year ago Essential hypertension, benign   [facility] Roosvelt Maser [place], [place]   2 years ago Essential hypertension, benign   [facility] Roosvelt Maser [place], [place]              Passed - Last Heart Rate in normal range    Pulse Readings from Last 1 Encounters:  03/05/20 67             Requested Prescriptions  Pending Prescriptions Disp Refills   atenolol (TENORMIN) 50 MG tablet [Pharmacy Med Name: ATENOLOL 50 MG TABLET] 90 tablet 1    Sig: TAKE 1 TABLET BY MOUTH EVERY DAY     Cardiovascular:  Beta Blockers Failed - 10/05/2020  7:40 AM      Failed - Last BP in normal range    BP Readings from Last 1 Encounters:  03/05/20 (!) 161/100          Failed - Valid encounter within last 6 months    Recent Outpatient Visits           7 months ago Annual physical exam    [facility], [facility]   1 year ago Essential hypertension, benign   [facility] Particia Nearing, [place]   1 year ago Preop cardiovascular exam   [facility] Particia [place], [place]   1 year ago Essential hypertension, benign   [facility] Roosvelt Maser [place], [place]   2 years ago Essential hypertension, benign   [facility] Roosvelt Maser [place], [place]              Passed - Last Heart Rate in normal range    Pulse Readings from Last 1 Encounters:  03/05/20 67

## 2020-10-05 NOTE — Telephone Encounter (Signed)
Requested medications are due for refill today yes  Requested medications are on the active medication list yes  Last refill 8/8 for 30 day supply  Last visit 02/2020, lab 02/2020  Future visit scheduled no, pt canceled 8/1  Notes to clinic Has already had a curtesy refill and there is no upcoming appointment scheduled, pt canceled 09/03/20.

## 2020-10-14 ENCOUNTER — Other Ambulatory Visit: Payer: Self-pay | Admitting: Nurse Practitioner

## 2020-10-14 DIAGNOSIS — I10 Essential (primary) hypertension: Secondary | ICD-10-CM

## 2020-10-14 DIAGNOSIS — E7800 Pure hypercholesterolemia, unspecified: Secondary | ICD-10-CM

## 2020-10-14 NOTE — Telephone Encounter (Signed)
Requested medication (s) are due for refill today: yes  Requested medication (s) are on the active medication list: yes  Last refill:  all 3 meds were last filled on 09/10/20 #30 each  Future visit scheduled: no  Notes to clinic:  Called pt and LM on VM to call back to make appt- call back number provided   Requested Prescriptions  Pending Prescriptions Disp Refills   amLODipine (NORVASC) 10 MG tablet [Pharmacy Med Name: AMLODIPINE BESYLATE 10 MG TAB] 30 tablet 0    Sig: TAKE 1 TABLET BY MOUTH EVERY DAY     Cardiovascular:  Calcium Channel Blockers Failed - 10/14/2020  9:33 AM      Failed - Last BP in normal range    BP Readings from Last 1 Encounters:  03/05/20 (!) 161/100          Failed - Valid encounter within last 6 months    Recent Outpatient Visits           7 months ago Annual physical exam   [facility], [facility]   1 year ago Essential hypertension, benign   [facility] [place], [place], [place]   1 year ago Preop cardiovascular exam   [facility] Particia Nearing, [place]   1 year ago Essential hypertension, benign   [facility] Roosvelt Maser [place], [place]   2 years ago Essential hypertension, benign   [facility] [place], [place], [place]               atenolol (TENORMIN) 50 MG tablet [Pharmacy Med Name: ATENOLOL 50 MG TABLET] 30 tablet 0    Sig: TAKE 1 TABLET BY MOUTH EVERY DAY     Cardiovascular:  Beta Blockers Failed - 10/14/2020  9:33 AM      Failed - Last BP in normal range    BP Readings from Last 1 Encounters:  03/05/20 (!) 161/100          Failed - Valid encounter within last 6 months    Recent Outpatient Visits           7 months ago Annual physical exam   [facility], [facility]   1 year ago Essential hypertension, benign   [facility] [place], [place], [place]   1 year ago Preop cardiovascular exam   [facility] Particia N[place], [place]   1 year ago Essential hypertension, benign   [facility] [place], [place], [place]   2 years ago Essential hypertension, benign   [facility] [place], [place], [place]              Passed - Last Heart Rate in normal range    Pulse Readings from Last 1 Encounters:  03/05/20 67           atorvastatin (LIPITOR) 20 MG tablet [Pharmacy Med Name: ATORVASTATIN 20 MG TABLET] 30 tablet 0    Sig: TAKE 1 TABLET BY MOUTH EVERY DAY     Cardiovascular:  Antilipid - Statins Failed - 10/14/2020  9:33 AM      Failed - Total Cholesterol in normal range and within 360 days    Cholesterol, Total  Date Value Ref Range Status  03/05/2020 220 (H) 100 - 199 mg/dL Final   Cholesterol Piccolo, Waived  Date Value Ref Range Status  10/13/2014 170 <200 mg/dL Final    Comment:  Desirable                <200                         Borderline High      200- 239                         High                     >239           Failed - LDL in normal range and within 360 days    LDL Chol Calc (NIH)  Date Value Ref Range Status  03/05/2020 138 (H) 0 - 99 mg/dL Final          Passed - HDL in normal range and within 360 days    HDL  Date Value Ref Range Status  03/05/2020 59 >39 mg/dL Final          Passed - Triglycerides in normal range and within 360 days    Triglycerides  Date Value Ref Range Status  03/05/2020 131 0 - 149 mg/dL Final   Triglycerides Piccolo,Waived  Date Value Ref Range Status  10/13/2014 81 <150 mg/dL Final    Comment:                            Normal                   <150                         Borderline High     150 - 199                         High                200 - 499                         Very High                >499           Passed - Patient is not pregnant      Passed - Valid encounter within last 12 months    Recent Outpatient Visits            7 months ago Annual physical exam   [facility] Larae Grooms, [facility]   1 year ago Essential hypertension, benign   [facility] [place], [place], [place]   1 year ago Preop cardiovascular exam   [facility] Particia Nearing, [place]   1 year ago Essential hypertension, benign   [facility] [place], [place], [place]   2 years ago Essential hypertension, benign   [facility] [place], [place], [place]

## 2020-10-16 NOTE — Telephone Encounter (Signed)
Patient is overdue for appointment 

## 2020-12-05 ENCOUNTER — Other Ambulatory Visit: Payer: Self-pay | Admitting: Orthopedic Surgery

## 2020-12-05 ENCOUNTER — Other Ambulatory Visit: Payer: Self-pay

## 2020-12-05 ENCOUNTER — Ambulatory Visit (INDEPENDENT_AMBULATORY_CARE_PROVIDER_SITE_OTHER): Payer: Commercial Managed Care - PPO | Admitting: Nurse Practitioner

## 2020-12-05 ENCOUNTER — Encounter: Payer: Self-pay | Admitting: Nurse Practitioner

## 2020-12-05 VITALS — BP 200/126 | HR 73 | Temp 98.3°F | Wt 176.0 lb

## 2020-12-05 DIAGNOSIS — E7800 Pure hypercholesterolemia, unspecified: Secondary | ICD-10-CM

## 2020-12-05 DIAGNOSIS — I10 Essential (primary) hypertension: Secondary | ICD-10-CM | POA: Diagnosis not present

## 2020-12-05 DIAGNOSIS — E559 Vitamin D deficiency, unspecified: Secondary | ICD-10-CM | POA: Diagnosis not present

## 2020-12-05 DIAGNOSIS — Z23 Encounter for immunization: Secondary | ICD-10-CM

## 2020-12-05 DIAGNOSIS — M25561 Pain in right knee: Secondary | ICD-10-CM

## 2020-12-05 MED ORDER — ATENOLOL 50 MG PO TABS: 50.0000 mg | ORAL_TABLET | Freq: Every day | ORAL | 1 refills | Status: DC

## 2020-12-05 MED ORDER — ATORVASTATIN CALCIUM 20 MG PO TABS: ORAL_TABLET | ORAL | 1 refills | Status: DC

## 2020-12-05 MED ORDER — HYDRALAZINE HCL 10 MG PO TABS: 10.0000 mg | ORAL_TABLET | Freq: Two times a day (BID) | ORAL | 1 refills | Status: DC | PRN

## 2020-12-05 MED ORDER — HYDROCHLOROTHIAZIDE 12.5 MG PO CAPS: 12.5000 mg | ORAL_CAPSULE | Freq: Every day | ORAL | 1 refills | Status: DC

## 2020-12-05 MED ORDER — AMLODIPINE BESYLATE 10 MG PO TABS: 10.0000 mg | ORAL_TABLET | Freq: Every day | ORAL | 1 refills | Status: DC

## 2020-12-05 MED ORDER — IRBESARTAN-HYDROCHLOROTHIAZIDE 300-12.5 MG PO TABS: 1.0000 | ORAL_TABLET | Freq: Every day | ORAL | 1 refills | Status: DC

## 2020-12-05 NOTE — Assessment & Plan Note (Signed)
Labs ordered today.  Will make recommendations based on lab results. ?

## 2020-12-05 NOTE — Assessment & Plan Note (Signed)
Chronic. Uncontrolled.  Patient has not been taking any of her medications. Discussed the signs and symptoms of a stroke and when to seek higher level of care.  Will restart blood pressure medications.  Follow up in 2 weeks fore reevaluation.

## 2020-12-05 NOTE — Assessment & Plan Note (Signed)
Labs ordered today. Will restart Atorvastatin. Refill sent to the pharmacy for patient.

## 2020-12-05 NOTE — Progress Notes (Signed)
BP (!) 200/126   Pulse 73   Temp 98.3 F (36.8 C)   Wt 176 lb (79.8 kg)   SpO2 97%   BMI 31.18 kg/m    Subjective:    Patient ID: Rhonda Stanley, female    DOB: [birth year], 55 y.o.   MRN: [id number]  HPI: Rhonda Stanley is a 55 y.o. female  Chief Complaint  Patient presents with   Hypertension   vitamin d   HYPERTENSION / HYPERLIPIDEMIA Satisfied with current treatment? no Duration of hypertension: years BP monitoring frequency: not checking BP range:  BP medication side effects: no Past BP meds: none- not currently taking any of her medications. Duration of hyperlipidemia: years Cholesterol medication side effects: no Cholesterol supplements: none Past cholesterol medications:  was taking atorvastatin but has been off her medication Medication compliance: poor compliance Aspirin: no Recent stressors: no Recurrent headaches: no Visual changes: no Palpitations: no Dyspnea: no Chest pain: no Lower extremity edema: no Dizzy/lightheaded: no  Patient has upcoming knee surgery on the right knee on December 12.   Relevant past medical, surgical, family and social history reviewed and updated as indicated. Interim medical history since our last visit reviewed. Allergies and medications reviewed and updated.  Review of Systems  Eyes:  Negative for visual disturbance.  Respiratory:  Negative for cough, chest tightness and shortness of breath.   Cardiovascular:  Negative for chest pain, palpitations and leg swelling.  Neurological:  Negative for dizziness and headaches.   Per HPI unless specifically indicated above     Objective:    BP (!) 200/126   Pulse 73   Temp 98.3 F (36.8 C)   Wt 176 lb (79.8 kg)   SpO2 97%   BMI 31.18 kg/m   Wt Readings from Last 3 Encounters:  12/05/20 176 lb (79.8 kg)  03/05/20 214 lb 6.4 oz (97.3 kg)  06/23/19 202 lb (91.6 kg)    Physical Exam Vitals and nursing note reviewed.  Constitutional:      General: She is not  in acute distress.    Appearance: Normal appearance. She is normal weight. She is not ill-appearing, toxic-appearing or diaphoretic.  HENT:     Head: Normocephalic.     Right Ear: External ear normal.     Left Ear: External ear normal.     Nose: Nose normal.     Mouth/Throat:     Mouth: Mucous membranes are moist.     Pharynx: Oropharynx is clear.  Eyes:     General:        Right eye: No discharge.        Left eye: No discharge.     Extraocular Movements: Extraocular movements intact.     Conjunctiva/sclera: Conjunctivae normal.     Pupils: Pupils are equal, round, and reactive to light.  Cardiovascular:     Rate and Rhythm: Normal rate and regular rhythm.     Heart sounds: No murmur heard. Pulmonary:     Effort: Pulmonary effort is normal. No respiratory distress.     Breath sounds: Normal breath sounds. No wheezing or rales.  Musculoskeletal:     Cervical back: Normal range of motion and neck supple.     Comments: Using crutches. Right knee pain and swelling  Skin:    General: Skin is warm and dry.     Capillary Refill: Capillary refill takes less than 2 seconds.  Neurological:     General: No focal deficit present.     Mental  Status: She is alert and oriented to person, place, and time. Mental status is at baseline.  Psychiatric:        Mood and Affect: Mood normal.        Behavior: Behavior normal.        Thought Content: Thought content normal.        Judgment: Judgment normal.    Results for orders placed or performed in visit on 03/06/20  Cytology - PAP  Result Value Ref Range   Adequacy      UNSATISFACTORY for evaluation due to extremely scant cellularity. The   Adequacy      specimen is processed and examined microscopically, but is found to be   Adequacy      unsatisfactory for evaluation of an epithelial abnormality. Repeat study   Adequacy recommended.    Diagnosis - Non-diagnostic (A)       Assessment & Plan:   Problem List Items Addressed This Visit        Cardiovascular and Mediastinum   Essential hypertension, benign    Chronic. Uncontrolled.  Patient has not been taking any of her medications. Discussed the signs and symptoms of a stroke and when to seek higher level of care.  Will restart blood pressure medications.  Follow up in 2 weeks fore reevaluation.       Relevant Medications   amLODipine (NORVASC) 10 MG tablet   atenolol (TENORMIN) 50 MG tablet   atorvastatin (LIPITOR) 20 MG tablet   hydrALAZINE (APRESOLINE) 10 MG tablet   hydrochlorothiazide (MICROZIDE) 12.5 MG capsule   irbesartan-hydrochlorothiazide (AVALIDE) 300-12.5 MG tablet   Other Relevant Orders   Comp Met (CMET)     Other   Hypercholesterolemia - Primary    Labs ordered today. Will restart Atorvastatin. Refill sent to the pharmacy for patient.       Relevant Medications   amLODipine (NORVASC) 10 MG tablet   atenolol (TENORMIN) 50 MG tablet   atorvastatin (LIPITOR) 20 MG tablet   hydrALAZINE (APRESOLINE) 10 MG tablet   hydrochlorothiazide (MICROZIDE) 12.5 MG capsule   irbesartan-hydrochlorothiazide (AVALIDE) 300-12.5 MG tablet   Other Relevant Orders   Lipid Profile   Vitamin D deficiency    Labs ordered today. Will make recommendations based on lab results.       Relevant Orders   Vitamin D (25 hydroxy)   Other Visit Diagnoses     Need for influenza vaccination       Relevant Orders   Flu Vaccine QUAD 6+ mos PF IM (Fluarix Quad PF) (Completed)        Follow up plan: Return in about 2 months (around 02/04/2021) for BP Check.

## 2020-12-06 LAB — LIPID PANEL
Chol/HDL Ratio: 4.2 ratio (ref 0.0–4.4)
Cholesterol, Total: 220 mg/dL — ABNORMAL HIGH (ref 100–199)
HDL: 52 mg/dL (ref >39)
LDL Chol Calc (NIH): 150 mg/dL — ABNORMAL HIGH (ref 0–99)
Triglycerides: 99 mg/dL (ref 0–149)
VLDL Cholesterol Cal: 18 mg/dL (ref 5–40)

## 2020-12-06 LAB — COMPREHENSIVE METABOLIC PANEL
ALT: 14 IU/L (ref 0–32)
AST: 17 IU/L (ref 0–40)
Albumin/Globulin Ratio: 1.4 (ref 1.2–2.2)
Albumin: 4.3 g/dL (ref 3.8–4.9)
Alkaline Phosphatase: 123 IU/L — ABNORMAL HIGH (ref 44–121)
BUN/Creatinine Ratio: 22 (ref 9–23)
BUN: 14 mg/dL (ref 6–24)
Bilirubin Total: 0.7 mg/dL (ref 0.0–1.2)
CO2: 24 mmol/L (ref 20–29)
Calcium: 10.5 mg/dL — ABNORMAL HIGH (ref 8.7–10.2)
Chloride: 104 mmol/L (ref 96–106)
Creatinine, Ser: 0.63 mg/dL (ref 0.57–1.00)
Globulin, Total: 3 g/dL (ref 1.5–4.5)
Glucose: 83 mg/dL (ref 70–99)
Potassium: 4.5 mmol/L (ref 3.5–5.2)
Sodium: 141 mmol/L (ref 134–144)
Total Protein: 7.3 g/dL (ref 6.0–8.5)
eGFR: 106 mL/min/{1.73_m2} (ref >59)

## 2020-12-06 LAB — VITAMIN D 25 HYDROXY (VIT D DEFICIENCY, FRACTURES): Vit D, 25-Hydroxy: 14.7 ng/mL — ABNORMAL LOW (ref 30.0–100.0)

## 2020-12-06 NOTE — Progress Notes (Signed)
Please let patient know that her lab work shows her cholesterol is still elevated likely due to not being on her atorvastatin.  Recommend restarting the medication as discussed during visit today. Vitamin D remains low recommend continuing the vitamin D supplement.  We can discuss this further at her follow up in 2 weeks.

## 2020-12-07 ENCOUNTER — Other Ambulatory Visit: Payer: Self-pay

## 2020-12-07 ENCOUNTER — Ambulatory Visit
Admission: RE | Admit: 2020-12-07 | Discharge: 2020-12-07 | Disposition: A | Discharge status: Home / Self Care | Payer: Commercial Managed Care - PPO | Source: Ambulatory Visit | Attending: Orthopedic Surgery | Admitting: Orthopedic Surgery

## 2020-12-07 DIAGNOSIS — M25561 Pain in right knee: Secondary | ICD-10-CM

## 2020-12-19 ENCOUNTER — Other Ambulatory Visit: Payer: Self-pay

## 2020-12-19 ENCOUNTER — Encounter: Payer: Self-pay | Admitting: Nurse Practitioner

## 2020-12-19 ENCOUNTER — Ambulatory Visit (INDEPENDENT_AMBULATORY_CARE_PROVIDER_SITE_OTHER): Payer: Commercial Managed Care - PPO | Admitting: Nurse Practitioner

## 2020-12-19 VITALS — BP 150/97 | HR 64 | Temp 98.1°F | Wt 178.2 lb

## 2020-12-19 DIAGNOSIS — I10 Essential (primary) hypertension: Secondary | ICD-10-CM | POA: Diagnosis not present

## 2020-12-19 MED ORDER — HYDRALAZINE HCL 25 MG PO TABS: 25.0000 mg | ORAL_TABLET | Freq: Three times a day (TID) | ORAL | 0 refills | Status: DC

## 2020-12-19 NOTE — Assessment & Plan Note (Signed)
Chronic.  Not well controlled. Will increase hydralazine 25mg  TID.  Patient has upcoming surgery. Recommend checking blood pressures at home.  Continue with Amlodipine, HCTZ, Irbesartan, and atenolol.  Follow up in 2 weeks.

## 2020-12-19 NOTE — Progress Notes (Signed)
BP (!) 150/97 (BP Location: Left Arm, Cuff Size: Normal)   Pulse 64   Temp 98.1 F (36.7 C) (Oral)   Wt 178 lb 3.2 oz (80.8 kg)   SpO2 98%   BMI 31.57 kg/m    Subjective:    Patient ID: Rhonda Stanley, female    DOB: 05/02/1965, 55 y.o.   MRN: [id number]  HPI: Rhonda Stanley is a 55 y.o. female  Chief Complaint  Patient presents with   Hypertension    2 week f/up   HYPERTENSION Hypertension status: controlled  Satisfied with current treatment? no Duration of hypertension: years BP monitoring frequency:  not checking BP range:  BP medication side effects:  no Medication compliance: excellent compliance Previous BP meds: hydralazine, amlodipine, atenolol, and HCTZ Aspirin: no Recurrent headaches: no Visual changes: no Palpitations: no Dyspnea: no Chest pain: no Lower extremity edema: no Dizzy/lightheaded: no  Relevant past medical, surgical, family and social history reviewed and updated as indicated. Interim medical history since our last visit reviewed. Allergies and medications reviewed and updated.  Review of Systems  Eyes:  Negative for visual disturbance.  Respiratory:  Negative for cough, chest tightness and shortness of breath.   Cardiovascular:  Negative for chest pain, palpitations and leg swelling.  Neurological:  Negative for dizziness and headaches.   Per HPI unless specifically indicated above     Objective:    BP (!) 150/97 (BP Location: Left Arm, Cuff Size: Normal)   Pulse 64   Temp 98.1 F (36.7 C) (Oral)   Wt 178 lb 3.2 oz (80.8 kg)   SpO2 98%   BMI 31.57 kg/m   Wt Readings from Last 3 Encounters:  12/19/20 178 lb 3.2 oz (80.8 kg)  12/05/20 176 lb (79.8 kg)  03/05/20 214 lb 6.4 oz (97.3 kg)    Physical Exam Vitals and nursing note reviewed.  Constitutional:      General: She is not in acute distress.    Appearance: Normal appearance. She is normal weight. She is not ill-appearing, toxic-appearing or diaphoretic.  HENT:      Head: Normocephalic.     Right Ear: External ear normal.     Left Ear: External ear normal.     Nose: Nose normal.     Mouth/Throat:     Mouth: Mucous membranes are moist.     Pharynx: Oropharynx is clear.  Eyes:     General:        Right eye: No discharge.        Left eye: No discharge.     Extraocular Movements: Extraocular movements intact.     Conjunctiva/sclera: Conjunctivae normal.     Pupils: Pupils are equal, round, and reactive to light.  Cardiovascular:     Rate and Rhythm: Normal rate and regular rhythm.     Heart sounds: No murmur heard. Pulmonary:     Effort: Pulmonary effort is normal. No respiratory distress.     Breath sounds: Normal breath sounds. No wheezing or rales.  Musculoskeletal:     Cervical back: Normal range of motion and neck supple.  Skin:    General: Skin is warm and dry.     Capillary Refill: Capillary refill takes less than 2 seconds.  Neurological:     General: No focal deficit present.     Mental Status: She is alert and oriented to person, place, and time. Mental status is at baseline.  Psychiatric:        Mood and Affect: Mood  normal.        Behavior: Behavior normal.        Thought Content: Thought content normal.        Judgment: Judgment normal.    Results for orders placed or performed in visit on 12/05/20  Comp Met (CMET)  Result Value Ref Range   Glucose 83 70 - 99 mg/dL   BUN 14 6 - 24 mg/dL   Creatinine, Ser 0.63 0.57 - 1.00 mg/dL   eGFR 106 >59 mL/min/1.73   BUN/Creatinine Ratio 22 9 - 23   Sodium 141 134 - 144 mmol/L   Potassium 4.5 3.5 - 5.2 mmol/L   Chloride 104 96 - 106 mmol/L   CO2 24 20 - 29 mmol/L   Calcium 10.5 (H) 8.7 - 10.2 mg/dL   Total Protein 7.3 6.0 - 8.5 g/dL   Albumin 4.3 3.8 - 4.9 g/dL   Globulin, Total 3.0 1.5 - 4.5 g/dL   Albumin/Globulin Ratio 1.4 1.2 - 2.2   Bilirubin Total 0.7 0.0 - 1.2 mg/dL   Alkaline Phosphatase 123 (H) 44 - 121 IU/L   AST 17 0 - 40 IU/L   ALT 14 0 - 32 IU/L  Lipid Profile   Result Value Ref Range   Cholesterol, Total 220 (H) 100 - 199 mg/dL   Triglycerides 99 0 - 149 mg/dL   HDL 52 >39 mg/dL   VLDL Cholesterol Cal 18 5 - 40 mg/dL   LDL Chol Calc (NIH) 150 (H) 0 - 99 mg/dL   Chol/HDL Ratio 4.2 0.0 - 4.4 ratio  Vitamin D (25 hydroxy)  Result Value Ref Range   Vit D, 25-Hydroxy 14.7 (L) 30.0 - 100.0 ng/mL      Assessment & Plan:   Problem List Items Addressed This Visit       Cardiovascular and Mediastinum   Essential hypertension, benign - Primary    Chronic.  Not well controlled. Will increase hydralazine 28m TID.  Patient has upcoming surgery. Recommend checking blood pressures at home.  Continue with Amlodipine, HCTZ, Irbesartan, and atenolol.  Follow up in 2 weeks.      Relevant Medications   hydrALAZINE (APRESOLINE) 25 MG tablet     Follow up plan: Return in about 2 months (around 02/18/2021) for Surgical Clearance.

## 2020-12-19 NOTE — Progress Notes (Deleted)
There were no vitals taken for this visit.   Subjective:    Patient ID: Rhonda Stanley, female    DOB: 11-17-1965, 55 y.o.   MRN: [id number]  HPI: Rhonda Stanley is a 55 y.o. female  No chief complaint on file.  12/19/2020  Assessment:  Preoperative evaluation and clearance show: {Preop assessment:15915} and patient is cleared for diagnosis for surgical procedure.  Other diagnoses affecting surgical risk include: {DIAGNOSIS, TGGYI:94854}   Type of Surgery: Low, <1% perioperative cardiac risk (cataract, minor head & neck, minor prostate, superficial surgeries) Intermediate, 1% - 5% cardiac risk (major intraabdominal, intrathoracic, orthopedic, major head & neck, prostatectomy) High, >5% cardiac risk (aortic repair, non-carotid major vascular, peripheral vascular, prolonged procedure, anticipated large blood loss, major emergency/trauma)  Lee's Revised Cardiac Index: 0 Risk class I, very low, 0.4% risk of cardiac complications 1 Risk class II, low, 0.9% risk of cardiac complications 2 Risk class III, moderate, 6.6% risk of cardiac complications 3+ Risk class IV, high, 11% risk of cardiac complications  Plan:  1. Patient requires endocarditis prophylaxis: {yes/no:63}. 2. GENERAL PREOP INSTRUCTIONS: Proceed with surgery as planned. No food or liquids the morning of surgery. Call surgeon if develops respiratory illness, fever, or other illness. 3. Medications to Hold: *** for 7 days before surgery, unless instructed otherwise by surgeon. 4. EKG:  12/19/2020 : No acute ST changes noted *** 5. ***Chest X-ray:  12/19/2020 *** : No acute process. 6. Ordered Labs: ***  From a medical standpoint the patient is an acceptable candidates to undergo general anesthesia for surgery. ***  It is recommended to correct electrolytes and to keep the patient euvolemic and avoid significant fluid shifts during surgery.  Labs reviewed and pt is cleared for surgery. Pt denies a hx of adverse  reactions to anesthesia.    Rhonda Stanley is a 55 y.o. female who presents to the office today for a preoperative consultation at the request of ***, who will perform a  *** on *** at {GEN PROCEDURE OEVOJ:50093}.  Current Complaints: ***  No past medical history on file. Family History  Problem Relation Age of Onset   Hypertension Mother    Hypertension Father    Current Outpatient Medications  Medication Sig Dispense Refill   amLODipine (NORVASC) 10 MG tablet Take 1 tablet (10 mg total) by mouth daily. 90 tablet 1   atenolol (TENORMIN) 50 MG tablet Take 1 tablet (50 mg total) by mouth daily. 90 tablet 1   atorvastatin (LIPITOR) 20 MG tablet TAKE 1 TABLET BY MOUTH DAILY. TAKE 1 TABLET BY MOUTH EVERY DAY. 90 tablet 1   hydrALAZINE (APRESOLINE) 10 MG tablet Take 1 tablet (10 mg total) by mouth 2 (two) times daily as needed. 180 tablet 1   hydrochlorothiazide (MICROZIDE) 12.5 MG capsule Take 1 capsule (12.5 mg total) by mouth daily. 90 capsule 1   irbesartan-hydrochlorothiazide (AVALIDE) 300-12.5 MG tablet Take 1 tablet by mouth daily. 90 tablet 1   meloxicam (MOBIC) 15 MG tablet Take 15 mg by mouth daily.     PARAGARD INTRAUTERINE COPPER IUD IUD 1 each by Intrauterine route once.     ULTRAM 50 MG tablet Take 50 mg by mouth.     Vitamin D, Ergocalciferol, (DRISDOL) 1.25 MG (50000 UNIT) CAPS capsule Take 1 capsule (50,000 Units total) by mouth every 7 (seven) days. (Patient not taking: Reported on 12/05/2020) 12 capsule 1   No current facility-administered medications for this visit.   No Known Allergies Social History  Socioeconomic History   Marital status: Married    Spouse name: Not on file   Number of children: Not on file   Years of education: Not on file   Highest education level: Not on file  Occupational History   Occupation: [employer]  Tobacco Use   Smoking status: Never   Smokeless tobacco: Never  Vaping Use   Vaping Use: Never used  Substance and Sexual Activity    Alcohol use: Yes    Alcohol/week: 3.0 standard drinks    Types: 3 Standard drinks or equivalent per week    Comment: on occasion   Drug use: No   Sexual activity: Yes    Partners: Male    Birth control/protection: I.U.D.  Other Topics Concern   Not on file  Social History Narrative   Not on file   Social Determinants of Health   Financial Resource Strain: Not on file  Food Insecurity: Not on file  Transportation Needs: Not on file  Physical Activity: Not on file  Stress: Not on file  Social Connections: Not on file  Intimate Partner Violence: Not on file     Preoperative Risk Factors  1. Major predictors that require intensive management and may lead to delay in or cancellation of the operative procedure unless emergent:  {yes/no:63}Unstable coronary syndromes including unstable or severe angina or recent MI  {yes/no:63}Decompensated heart failure including NYHA functional class 4 or worsening or new onset HF  {yes/no:63} Significant arrhythmia including high grade AV block, symptomatic ventricular arrhythmias, supraventricular arrhythmias with a ventricular rate >100 bpm at rest, symptomatic bradycardia, and newly recognized ventricular tachycardia  {yes/no:63} Severe heart valve disease including severe aortic stenosis or symptomatic mitral stenosis  2. Additional independent predictors of major cardiac complications:  {GYF/VC:94}WHQ risk type of surgery (Vascular surgery and any open intraperitoneal or intrathoracic procedures)  Other clinical predictors that warrant careful assessment of current status  {yes/no:63}History of ischemic heart disease {yes/no:63}History of CVA {yes/no:63}  History of compensated heart failure or prior heart failure {yes/no:63} Diabetes mellitus on Insulin {yes/no:63} Renal insufficiency  3. Perioperative cardiac and long term risk is increased in pts unable to meet a 4-MET demand during most normal daily activities:  {YES / NO}  Ability to climb 2 flights of stairs or walk four blocks  Objective:  There were no vitals taken for this visit.  There is no height or weight on file to calculate BMI.     [name] 12/19/20  Relevant past medical, surgical, family and social history reviewed and updated as indicated. Interim medical history since our last visit reviewed. Allergies and medications reviewed and updated.  Review of Systems  Per HPI unless specifically indicated above     Objective:    There were no vitals taken for this visit.  Wt Readings from Last 3 Encounters:  12/05/20 176 lb (79.8 kg)  03/05/20 214 lb 6.4 oz (97.3 kg)  06/23/19 202 lb (91.6 kg)    Physical Exam  Results for orders placed or performed in visit on 12/05/20  Comp Met (CMET)  Result Value Ref Range   Glucose 83 70 - 99 mg/dL   BUN 14 6 - 24 mg/dL   Creatinine, Ser 0.63 0.57 - 1.00 mg/dL   eGFR 106 >59 mL/min/1.73   BUN/Creatinine Ratio 22 9 - 23   Sodium 141 134 - 144 mmol/L   Potassium 4.5 3.5 - 5.2 mmol/L   Chloride 104 96 - 106 mmol/L   CO2 24 20 - 29  mmol/L   Calcium 10.5 (H) 8.7 - 10.2 mg/dL   Total Protein 7.3 6.0 - 8.5 g/dL   Albumin 4.3 3.8 - 4.9 g/dL   Globulin, Total 3.0 1.5 - 4.5 g/dL   Albumin/Globulin Ratio 1.4 1.2 - 2.2   Bilirubin Total 0.7 0.0 - 1.2 mg/dL   Alkaline Phosphatase 123 (H) 44 - 121 IU/L   AST 17 0 - 40 IU/L   ALT 14 0 - 32 IU/L  Lipid Profile  Result Value Ref Range   Cholesterol, Total 220 (H) 100 - 199 mg/dL   Triglycerides 99 0 - 149 mg/dL   HDL 52 >39 mg/dL   VLDL Cholesterol Cal 18 5 - 40 mg/dL   LDL Chol Calc (NIH) 150 (H) 0 - 99 mg/dL   Chol/HDL Ratio 4.2 0.0 - 4.4 ratio  Vitamin D (25 hydroxy)  Result Value Ref Range   Vit D, 25-Hydroxy 14.7 (L) 30.0 - 100.0 ng/mL      Assessment & Plan:   Problem List Items Addressed This Visit       Cardiovascular and Mediastinum   Essential hypertension, benign - Primary     Other   Hypercholesterolemia   Vitamin  D deficiency     Follow up plan: No follow-ups on file.

## 2020-12-24 ENCOUNTER — Ambulatory Visit: Payer: Self-pay | Admitting: Physician Assistant

## 2020-12-24 DIAGNOSIS — M25561 Pain in right knee: Secondary | ICD-10-CM

## 2020-12-24 DIAGNOSIS — T84012A Broken internal right knee prosthesis, initial encounter: Secondary | ICD-10-CM

## 2020-12-24 NOTE — Progress Notes (Signed)
Sent message, via epic in basket, requesting orders in epic from surgeon.  

## 2020-12-24 NOTE — H&P (Signed)
TOTAL KNEE REVISION ADMISSION H&P  Patient is being admitted for right revision total knee arthroplasty.  Subjective:  Chief Complaint:right knee pain.  HPI: Rhonda Stanley, 55 y.o. female, has a history of pain and functional disability in the right knee(s) due to failed previous arthroplasty and patient has failed non-surgical conservative treatments for greater than 12 weeks to include NSAID's and/or analgesics, use of assistive devices, weight reduction as appropriate, and activity modification. The indications for the revision of the total knee arthroplasty are loosening of one or more components and fracture or mechanical failure of one or components. Onset of symptoms was abrupt starting  month or so ago with gradually worsening course since that time.  Prior procedures on the right knee(s) include unicompartmental arthroplasty.  Patient currently rates pain in the right knee(s) at 8 out of 10 with activity. There is night pain, worsening of pain with activity and weight bearing, pain that interferes with activities of daily living, pain with passive range of motion, crepitus, and joint swelling.  Patient has evidence of prosthetic loosening and tibial plateau fracture  by imaging studies. This condition presents safety issues increasing the risk of falls. This patient has had proximal tibial fracture.  There is no current active infection.  Patient Active Problem List   Diagnosis Date Noted   Unilateral primary osteoarthritis, right knee 12/29/2016   Vitamin D deficiency 12/19/2016   Knee pain, right 09/12/2016   Cutaneous skin tags 09/25/2015   Obesity 08/28/2015   Essential hypertension, benign 07/13/2014   Hypercholesterolemia 07/13/2014   No past medical history on file.  Past Surgical History:  Procedure Laterality Date   SHOULDER SURGERY Right 2011    Current Outpatient Medications  Medication Sig Dispense Refill Last Dose   amLODipine (NORVASC) 10 MG tablet Take 1 tablet  (10 mg total) by mouth daily. 90 tablet 1    atenolol (TENORMIN) 50 MG tablet Take 1 tablet (50 mg total) by mouth daily. 90 tablet 1    atorvastatin (LIPITOR) 20 MG tablet TAKE 1 TABLET BY MOUTH DAILY. TAKE 1 TABLET BY MOUTH EVERY DAY. 90 tablet 1    hydrALAZINE (APRESOLINE) 25 MG tablet Take 1 tablet (25 mg total) by mouth 3 (three) times daily. 90 tablet 0    hydrochlorothiazide (MICROZIDE) 12.5 MG capsule Take 1 capsule (12.5 mg total) by mouth daily. 90 capsule 1    irbesartan-hydrochlorothiazide (AVALIDE) 300-12.5 MG tablet Take 1 tablet by mouth daily. 90 tablet 1    meloxicam (MOBIC) 15 MG tablet Take 15 mg by mouth daily.      PARAGARD INTRAUTERINE COPPER IUD IUD 1 each by Intrauterine route once.      ULTRAM 50 MG tablet Take 50 mg by mouth.      Vitamin D, Ergocalciferol, (DRISDOL) 1.25 MG (50000 UNIT) CAPS capsule Take 1 capsule (50,000 Units total) by mouth every 7 (seven) days. 12 capsule 1    No current facility-administered medications for this visit.   No Known Allergies  Social History   Tobacco Use   Smoking status: Never   Smokeless tobacco: Never  Substance Use Topics   Alcohol use: Yes    Alcohol/week: 3.0 standard drinks    Types: 3 Standard drinks or equivalent per week    Comment: on occasion    Family History  Problem Relation Age of Onset   Hypertension Mother    Hypertension Father       Review of Systems  Musculoskeletal:  Positive for arthralgias.  All  other systems reviewed and are negative.   Objective:  Physical Exam Constitutional:      Appearance: Normal appearance.  HENT:     Head: Normocephalic and atraumatic.  Eyes:     Extraocular Movements: Extraocular movements intact.     Pupils: Pupils are equal, round, and reactive to light.  Cardiovascular:     Rate and Rhythm: Normal rate and regular rhythm.     Pulses: Normal pulses.     Heart sounds: Normal heart sounds.  Pulmonary:     Effort: Pulmonary effort is normal. No respiratory  distress.     Breath sounds: Normal breath sounds.  Abdominal:     General: Abdomen is flat. Bowel sounds are normal. There is no distension.     Palpations: Abdomen is soft.     Tenderness: There is no abdominal tenderness.  Musculoskeletal:     Cervical back: Normal range of motion and neck supple.     Comments: She is a well-appearing female.  She ambulates with a mildly antalgic gait.  Her right knee has a well healed anterior incision.  Her range of motion is approximately 0 to 125 degrees.  She has good stability to varus and valgus stress.  Negative anterior and posterior drawer.  She has intact active knee extension.  She is tender along the medial joint line and medial plateau.  She has no pain with hip flexion, internal rotation.  Nontender over the greater trochanter.  The left knee has a well healed incision.  Range of motion is 0 to 120 degrees.  Stable to varus and valgus stress.  Negative anterior and posterior drawer.  Intact active knee extension.  She has 5/5 motor strength with hip flexion, knee extension, ankle dorsiflexion and plantarflexion.  She has sensation intact to light touch below both knees and she is warm and well perfused with 2+ dorsalis pedis pulses bilaterally.    Lymphadenopathy:     Cervical: No cervical adenopathy.  Skin:    General: Skin is warm and dry.     Findings: No erythema or rash.  Neurological:     General: No focal deficit present.     Mental Status: She is alert and oriented to person, place, and time.  Psychiatric:        Mood and Affect: Mood normal.        Behavior: Behavior normal.    Vital signs in last 24 hours: @VSRANGES @  Labs:  Estimated body mass index is 31.57 kg/m as calculated from the following:   Height as of 03/05/20: 5\' 3"  (1.6 m).   Weight as of 12/19/20: 80.8 kg.  Imaging Review Plain radiographs demonstrate mild degenerative joint disease of the right knee(s). The overall alignment is mild varus.There is evidence of  loosening of the femoral and tibial components. The bone quality appears to be good for age and reported activity level. There is a nondisplaced fracture line through medial proximal tibial plateau.    Assessment/Plan:  End stage arthritis, right knee(s) with failed previous arthroplasty.   The patient history, physical examination, clinical judgment of the provider and imaging studies are consistent with end stage degenerative joint disease of the right knee(s), previous total knee arthroplasty. Revision total knee arthroplasty is deemed medically necessary. The treatment options including medical management, injection therapy, arthroscopy and revision arthroplasty were discussed at length. The risks and benefits of revision total knee arthroplasty were presented and reviewed. The risks due to aseptic loosening, infection, stiffness, patella tracking problems, thromboembolic  complications and other imponderables were discussed. The patient acknowledged the explanation, agreed to proceed with the plan and consent was signed. Patient is being admitted for inpatient treatment for surgery, pain control, PT, OT, prophylactic antibiotics, VTE prophylaxis, progressive ambulation and ADL's and discharge planning.The patient is planning to be discharged home with home health services

## 2020-12-24 NOTE — H&P (View-Only) (Signed)
TOTAL KNEE REVISION ADMISSION H&P  Patient is being admitted for right revision total knee arthroplasty.  Subjective:  Chief Complaint:right knee pain.  HPI: Rhonda Stanley, 55 y.o. female, has a history of pain and functional disability in the right knee(s) due to failed previous arthroplasty and patient has failed non-surgical conservative treatments for greater than 12 weeks to include NSAID's and/or analgesics, use of assistive devices, weight reduction as appropriate, and activity modification. The indications for the revision of the total knee arthroplasty are loosening of one or more components and fracture or mechanical failure of one or components. Onset of symptoms was abrupt starting  month or so ago with gradually worsening course since that time.  Prior procedures on the right knee(s) include unicompartmental arthroplasty.  Patient currently rates pain in the right knee(s) at 8 out of 10 with activity. There is night pain, worsening of pain with activity and weight bearing, pain that interferes with activities of daily living, pain with passive range of motion, crepitus, and joint swelling.  Patient has evidence of prosthetic loosening and tibial plateau fracture  by imaging studies. This condition presents safety issues increasing the risk of falls. This patient has had proximal tibial fracture.  There is no current active infection.  Patient Active Problem List   Diagnosis Date Noted   Unilateral primary osteoarthritis, right knee 12/29/2016   Vitamin D deficiency 12/19/2016   Knee pain, right 09/12/2016   Cutaneous skin tags 09/25/2015   Obesity 08/28/2015   Essential hypertension, benign 07/13/2014   Hypercholesterolemia 07/13/2014   No past medical history on file.  Past Surgical History:  Procedure Laterality Date   SHOULDER SURGERY Right 2011    Current Outpatient Medications  Medication Sig Dispense Refill Last Dose   amLODipine (NORVASC) 10 MG tablet Take 1 tablet  (10 mg total) by mouth daily. 90 tablet 1    atenolol (TENORMIN) 50 MG tablet Take 1 tablet (50 mg total) by mouth daily. 90 tablet 1    atorvastatin (LIPITOR) 20 MG tablet TAKE 1 TABLET BY MOUTH DAILY. TAKE 1 TABLET BY MOUTH EVERY DAY. 90 tablet 1    hydrALAZINE (APRESOLINE) 25 MG tablet Take 1 tablet (25 mg total) by mouth 3 (three) times daily. 90 tablet 0    hydrochlorothiazide (MICROZIDE) 12.5 MG capsule Take 1 capsule (12.5 mg total) by mouth daily. 90 capsule 1    irbesartan-hydrochlorothiazide (AVALIDE) 300-12.5 MG tablet Take 1 tablet by mouth daily. 90 tablet 1    meloxicam (MOBIC) 15 MG tablet Take 15 mg by mouth daily.      PARAGARD INTRAUTERINE COPPER IUD IUD 1 each by Intrauterine route once.      ULTRAM 50 MG tablet Take 50 mg by mouth.      Vitamin D, Ergocalciferol, (DRISDOL) 1.25 MG (50000 UNIT) CAPS capsule Take 1 capsule (50,000 Units total) by mouth every 7 (seven) days. 12 capsule 1    No current facility-administered medications for this visit.   No Known Allergies  Social History   Tobacco Use   Smoking status: Never   Smokeless tobacco: Never  Substance Use Topics   Alcohol use: Yes    Alcohol/week: 3.0 standard drinks    Types: 3 Standard drinks or equivalent per week    Comment: on occasion    Family History  Problem Relation Age of Onset   Hypertension Mother    Hypertension Father       Review of Systems  Musculoskeletal:  Positive for arthralgias.  All   other systems reviewed and are negative.   Objective:  Physical Exam Constitutional:      Appearance: Normal appearance.  HENT:     Head: Normocephalic and atraumatic.  Eyes:     Extraocular Movements: Extraocular movements intact.     Pupils: Pupils are equal, round, and reactive to light.  Cardiovascular:     Rate and Rhythm: Normal rate and regular rhythm.     Pulses: Normal pulses.     Heart sounds: Normal heart sounds.  Pulmonary:     Effort: Pulmonary effort is normal. No respiratory  distress.     Breath sounds: Normal breath sounds.  Abdominal:     General: Abdomen is flat. Bowel sounds are normal. There is no distension.     Palpations: Abdomen is soft.     Tenderness: There is no abdominal tenderness.  Musculoskeletal:     Cervical back: Normal range of motion and neck supple.     Comments: She is a well-appearing female.  She ambulates with a mildly antalgic gait.  Her right knee has a well healed anterior incision.  Her range of motion is approximately 0 to 125 degrees.  She has good stability to varus and valgus stress.  Negative anterior and posterior drawer.  She has intact active knee extension.  She is tender along the medial joint line and medial plateau.  She has no pain with hip flexion, internal rotation.  Nontender over the greater trochanter.  The left knee has a well healed incision.  Range of motion is 0 to 120 degrees.  Stable to varus and valgus stress.  Negative anterior and posterior drawer.  Intact active knee extension.  She has 5/5 motor strength with hip flexion, knee extension, ankle dorsiflexion and plantarflexion.  She has sensation intact to light touch below both knees and she is warm and well perfused with 2+ dorsalis pedis pulses bilaterally.    Lymphadenopathy:     Cervical: No cervical adenopathy.  Skin:    General: Skin is warm and dry.     Findings: No erythema or rash.  Neurological:     General: No focal deficit present.     Mental Status: She is alert and oriented to person, place, and time.  Psychiatric:        Mood and Affect: Mood normal.        Behavior: Behavior normal.    Vital signs in last 24 hours: @VSRANGES@  Labs:  Estimated body mass index is 31.57 kg/m as calculated from the following:   Height as of 03/05/20: 5' 3" (1.6 m).   Weight as of 12/19/20: 80.8 kg.  Imaging Review Plain radiographs demonstrate mild degenerative joint disease of the right knee(s). The overall alignment is mild varus.There is evidence of  loosening of the femoral and tibial components. The bone quality appears to be good for age and reported activity level. There is a nondisplaced fracture line through medial proximal tibial plateau.    Assessment/Plan:  End stage arthritis, right knee(s) with failed previous arthroplasty.   The patient history, physical examination, clinical judgment of the provider and imaging studies are consistent with end stage degenerative joint disease of the right knee(s), previous total knee arthroplasty. Revision total knee arthroplasty is deemed medically necessary. The treatment options including medical management, injection therapy, arthroscopy and revision arthroplasty were discussed at length. The risks and benefits of revision total knee arthroplasty were presented and reviewed. The risks due to aseptic loosening, infection, stiffness, patella tracking problems, thromboembolic   complications and other imponderables were discussed. The patient acknowledged the explanation, agreed to proceed with the plan and consent was signed. Patient is being admitted for inpatient treatment for surgery, pain control, PT, OT, prophylactic antibiotics, VTE prophylaxis, progressive ambulation and ADL's and discharge planning.The patient is planning to be discharged home with home health services   

## 2020-12-28 ENCOUNTER — Other Ambulatory Visit (HOSPITAL_COMMUNITY): Payer: Self-pay

## 2021-01-01 NOTE — Progress Notes (Signed)
BP 137/88 (BP Location: Right Arm, Cuff Size: Large)   Pulse 62   Temp 98.9 F (37.2 C) (Oral)   Wt 181 lb 6.4 oz (82.3 kg)   SpO2 99%   BMI 32.13 kg/m    Subjective:    Patient ID: Rhonda Stanley, female    DOB: [birth year], 55 y.o.   MRN: [id number]  HPI: Rhonda Stanley is a 54 y.o. female  Chief Complaint  Patient presents with   Hypertension    2 week f/up    01/03/2021  Assessment:  Preoperative evaluation and clearance show: No contraindications to planned surgery and patient is cleared for diagnosis for surgical procedure.  Other diagnoses affecting surgical risk include: Other HTN, HLD, and Obesity.   Type of Surgery: Intermediate, 1% - 5% cardiac risk (major intraabdominal, intrathoracic, orthopedic, major head & neck, prostatectomy)   Lee's Revised Cardiac Index: 0 Risk class I, very low, 0.4% risk of cardiac complications  Plan:  1. Patient requires endocarditis prophylaxis: no. 2. GENERAL PREOP INSTRUCTIONS: Proceed with surgery as planned. No food or liquids the morning of surgery. Call surgeon if develops respiratory illness, fever, or other illness. 3. Medications to Hold: ASA and NSAIDS for 7 days before surgery, unless instructed otherwise by surgeon. 4. EKG:  01/03/2021 : No acute ST changes noted. 5. Ordered Labs: CMP, CBC, Lipid, UA  From a medical standpoint the patient is an acceptable candidates to undergo general anesthesia for surgery.  It is recommended to correct electrolytes and to keep the patient euvolemic and avoid significant fluid shifts during surgery.  Labs reviewed and pt is cleared for surgery. Pt denies a hx of adverse reactions to anesthesia.    Rhonda Stanley is a 55 y.o. female who presents to the office today for a preoperative consultation at the request of Dr. Zachery Dakins, who will perform a  TKR on Right at [facility].  Current Complaints: Right knee pain  Past Medical History:  Diagnosis Date   Hypertension     Family History  Problem Relation Age of Onset   Hypertension Mother    Hypertension Father    Current Outpatient Medications  Medication Sig Dispense Refill   amLODipine (NORVASC) 10 MG tablet Take 1 tablet (10 mg total) by mouth daily. 90 tablet 1   atenolol (TENORMIN) 50 MG tablet Take 1 tablet (50 mg total) by mouth daily. 90 tablet 1   atorvastatin (LIPITOR) 20 MG tablet TAKE 1 TABLET BY MOUTH DAILY. TAKE 1 TABLET BY MOUTH EVERY DAY. 90 tablet 1   hydrALAZINE (APRESOLINE) 25 MG tablet Take 1 tablet (25 mg total) by mouth 3 (three) times daily. 90 tablet 0   hydrochlorothiazide (MICROZIDE) 12.5 MG capsule Take 1 capsule (12.5 mg total) by mouth daily. 90 capsule 1   irbesartan-hydrochlorothiazide (AVALIDE) 300-12.5 MG tablet Take 1 tablet by mouth daily. 90 tablet 1   meloxicam (MOBIC) 15 MG tablet Take 15 mg by mouth daily as needed (inflammation).     PARAGARD INTRAUTERINE COPPER IUD IUD 1 each by Intrauterine route once.     Vitamin D, Ergocalciferol, (DRISDOL) 1.25 MG (50000 UNIT) CAPS capsule Take 1 capsule (50,000 Units total) by mouth every 7 (seven) days. 12 capsule 1   No current facility-administered medications for this visit.   No Known Allergies Social History   Socioeconomic History   Marital status: Legally Separated    Spouse name: Not on file   Number of children: Not on file   Years of  education: Not on file   Highest education level: Not on file  Occupational History   Occupation: [employer]  Tobacco Use   Smoking status: Never   Smokeless tobacco: Never  Vaping Use   Vaping Use: Never used  Substance and Sexual Activity   Alcohol use: Yes    Alcohol/week: 3.0 standard drinks    Types: 3 Standard drinks or equivalent per week    Comment: on occasion   Drug use: No   Sexual activity: Yes    Partners: Male    Birth control/protection: I.U.D.  Other Topics Concern   Not on file  Social History Narrative   Not on file   Social Determinants of Health    Financial Resource Strain: Not on file  Food Insecurity: Not on file  Transportation Needs: Not on file  Physical Activity: Not on file  Stress: Not on file  Social Connections: Not on file  Intimate Partner Violence: Not on file     Preoperative Risk Factors  1. Major predictors that require intensive management and may lead to delay in or cancellation of the operative procedure unless emergent:  No Unstable coronary syndromes including unstable or severe angina or recent MI  No Decompensated heart failure including NYHA functional class 4 or worsening or new onset HF  no Significant arrhythmia including high grade AV block, symptomatic ventricular arrhythmias, supraventricular arrhythmias with a ventricular rate >100 bpm at rest, symptomatic bradycardia, and newly recognized ventricular tachycardia  no Severe heart valve disease including severe aortic stenosis or symptomatic mitral stenosis  2. Additional independent predictors of major cardiac complications:  No Hgh risk type of surgery (Vascular surgery and any open intraperitoneal or intrathoracic procedures)  Other clinical predictors that warrant careful assessment of current status  No History of ischemic heart disease No History of CVA no  History of compensated heart failure or prior heart failure no Diabetes mellitus on Insulin no Renal insufficiency  3. Perioperative cardiac and long term risk is increased in pts unable to meet a 4-MET demand during most normal daily activities:  Yes Ability to climb 2 flights of stairs or walk four blocks  Objective:  BP 137/88 (BP Location: Right Arm, Cuff Size: Large)   Pulse 62   Temp 98.9 F (37.2 C) (Oral)   Wt 181 lb 6.4 oz (82.3 kg)   SpO2 99%   BMI 32.13 kg/m   Body mass index is 32.13 kg/m.     Rhonda Stanley 01/03/21  Relevant past medical, surgical, family and social history reviewed and updated as indicated. Interim medical history since our last  visit reviewed. Allergies and medications reviewed and updated.  Review of Systems  Eyes:  Negative for visual disturbance.  Respiratory:  Negative for cough, chest tightness and shortness of breath.   Cardiovascular:  Negative for chest pain, palpitations and leg swelling.  Musculoskeletal:        Right knee pain  Neurological:  Negative for dizziness and headaches.   Per HPI unless specifically indicated above     Objective:    BP 137/88 (BP Location: Right Arm, Cuff Size: Large)   Pulse 62   Temp 98.9 F (37.2 C) (Oral)   Wt 181 lb 6.4 oz (82.3 kg)   SpO2 99%   BMI 32.13 kg/m   Wt Readings from Last 3 Encounters:  01/03/21 181 lb 7 oz (82.3 kg)  01/02/21 181 lb 6.4 oz (82.3 kg)  12/19/20 178 lb 3.2 oz (80.8 kg)    Physical  Exam Vitals and nursing note reviewed.  Constitutional:      General: She is not in acute distress.    Appearance: Normal appearance. She is obese. She is not ill-appearing, toxic-appearing or diaphoretic.  HENT:     Head: Normocephalic.     Right Ear: External ear normal.     Left Ear: External ear normal.     Nose: Nose normal.     Mouth/Throat:     Mouth: Mucous membranes are moist.     Pharynx: Oropharynx is clear.  Eyes:     General:        Right eye: No discharge.        Left eye: No discharge.     Extraocular Movements: Extraocular movements intact.     Conjunctiva/sclera: Conjunctivae normal.     Pupils: Pupils are equal, round, and reactive to light.  Cardiovascular:     Rate and Rhythm: Normal rate and regular rhythm.     Heart sounds: No murmur heard. Pulmonary:     Effort: Pulmonary effort is normal. No respiratory distress.     Breath sounds: Normal breath sounds. No wheezing or rales.  Musculoskeletal:     Cervical back: Normal range of motion and neck supple.     Comments: None weight bearing due to right knee. Currently on crutches.  Skin:    General: Skin is warm and dry.     Capillary Refill: Capillary refill takes  less than 2 seconds.  Neurological:     General: No focal deficit present.     Mental Status: She is alert and oriented to person, place, and time. Mental status is at baseline.  Psychiatric:        Mood and Affect: Mood normal.        Behavior: Behavior normal.        Thought Content: Thought content normal.        Judgment: Judgment normal.    Results for orders placed or performed in visit on 01/02/21  Comp Met (CMET)  Result Value Ref Range   Glucose 98 70 - 99 mg/dL   BUN 18 6 - 24 mg/dL   Creatinine, Ser 0.72 0.57 - 1.00 mg/dL   eGFR 100 >59 mL/min/1.73   BUN/Creatinine Ratio 25 (H) 9 - 23   Sodium 140 134 - 144 mmol/L   Potassium 4.2 3.5 - 5.2 mmol/L   Chloride 99 96 - 106 mmol/L   CO2 24 20 - 29 mmol/L   Calcium 10.4 (H) 8.7 - 10.2 mg/dL   Total Protein 7.6 6.0 - 8.5 g/dL   Albumin 4.8 3.8 - 4.9 g/dL   Globulin, Total 2.8 1.5 - 4.5 g/dL   Albumin/Globulin Ratio 1.7 1.2 - 2.2   Bilirubin Total 1.2 0.0 - 1.2 mg/dL   Alkaline Phosphatase 130 (H) 44 - 121 IU/L   AST 16 0 - 40 IU/L   ALT 17 0 - 32 IU/L  Lipid Profile  Result Value Ref Range   Cholesterol, Total 210 (H) 100 - 199 mg/dL   Triglycerides 91 0 - 149 mg/dL   HDL 54 >39 mg/dL   VLDL Cholesterol Cal 16 5 - 40 mg/dL   LDL Chol Calc (NIH) 140 (H) 0 - 99 mg/dL   Chol/HDL Ratio 3.9 0.0 - 4.4 ratio  Urinalysis, Routine w reflex microscopic  Result Value Ref Range   Specific Gravity, UA 1.020 1.005 - 1.030   pH, UA 7.0 5.0 - 7.5   Color, UA Yellow Yellow  Appearance Ur Clear Clear   Leukocytes,UA Negative Negative   Protein,UA Negative Negative/Trace   Glucose, UA Negative Negative   Ketones, UA Negative Negative   RBC, UA Negative Negative   Bilirubin, UA Negative Negative   Urobilinogen, Ur 1.0 0.2 - 1.0 mg/dL   Nitrite, UA Negative Negative  CBC w/Diff  Result Value Ref Range   WBC 5.5 3.4 - 10.8 x10E3/uL   RBC 5.32 (H) 3.77 - 5.28 x10E6/uL   Hemoglobin 13.6 11.1 - 15.9 g/dL   Hematocrit 43.6 34.0  - 46.6 %   MCV 82 79 - 97 fL   MCH 25.6 (L) 26.6 - 33.0 pg   MCHC 31.2 (L) 31.5 - 35.7 g/dL   RDW 13.4 11.7 - 15.4 %   Platelets 382 150 - 450 x10E3/uL   Neutrophils 68 Not Estab. %   Lymphs 21 Not Estab. %   Monocytes 8 Not Estab. %   Eos 2 Not Estab. %   Basos 1 Not Estab. %   Neutrophils Absolute 3.8 1.4 - 7.0 x10E3/uL   Lymphocytes Absolute 1.1 0.7 - 3.1 x10E3/uL   Monocytes Absolute 0.4 0.1 - 0.9 x10E3/uL   EOS (ABSOLUTE) 0.1 0.0 - 0.4 x10E3/uL   Basophils Absolute 0.0 0.0 - 0.2 x10E3/uL   Immature Granulocytes 0 Not Estab. %   Immature Grans (Abs) 0.0 0.0 - 0.1 x10E3/uL      Assessment & Plan:   Problem List Items Addressed This Visit       Cardiovascular and Mediastinum   Essential hypertension, benign    Chronic.  Controlled.  Continue with current medication regimen.  Labs ordered today.  Return to clinic in 6 months for reevaluation.  Call sooner if concerns arise.        Relevant Orders   Comp Met (CMET) (Completed)     Other   Hypercholesterolemia    Chronic.  Controlled.  Continue with current medication regimen.  Labs ordered today.  Return to clinic in 6 months for reevaluation.  Call sooner if concerns arise.        Relevant Orders   Lipid Profile (Completed)   Obesity (BMI 30-39.9)   Knee pain, right    Surgery will be performed on 01/16/21.        Other Visit Diagnoses     Pre-op exam    -  Primary   Patient cleared for surgery. Not diabetic. CMP, Lipid, CBC and UA drawn in office. EKG within normal limits.    Relevant Orders   Comp Met (CMET) (Completed)   Lipid Profile (Completed)   Urinalysis, Routine w reflex microscopic (Completed)   CBC w/Diff (Completed)   EKG 12-Lead (Completed)        Follow up plan: Return in about 3 months (around 04/02/2021) for HTN, HLD, DM2 FU.

## 2021-01-02 ENCOUNTER — Other Ambulatory Visit: Payer: Self-pay

## 2021-01-02 ENCOUNTER — Encounter: Payer: Self-pay | Admitting: Nurse Practitioner

## 2021-01-02 ENCOUNTER — Ambulatory Visit (INDEPENDENT_AMBULATORY_CARE_PROVIDER_SITE_OTHER): Payer: Commercial Managed Care - PPO | Admitting: Nurse Practitioner

## 2021-01-02 VITALS — BP 137/88 | HR 62 | Temp 98.9°F | Wt 181.4 lb

## 2021-01-02 DIAGNOSIS — I10 Essential (primary) hypertension: Secondary | ICD-10-CM | POA: Diagnosis not present

## 2021-01-02 DIAGNOSIS — E7800 Pure hypercholesterolemia, unspecified: Secondary | ICD-10-CM | POA: Diagnosis not present

## 2021-01-02 DIAGNOSIS — E669 Obesity, unspecified: Secondary | ICD-10-CM

## 2021-01-02 DIAGNOSIS — G8929 Other chronic pain: Secondary | ICD-10-CM

## 2021-01-02 DIAGNOSIS — Z01818 Encounter for other preprocedural examination: Secondary | ICD-10-CM

## 2021-01-02 DIAGNOSIS — M25561 Pain in right knee: Secondary | ICD-10-CM

## 2021-01-02 DIAGNOSIS — E6609 Other obesity due to excess calories: Secondary | ICD-10-CM

## 2021-01-02 LAB — URINALYSIS, ROUTINE W REFLEX MICROSCOPIC
Bilirubin, UA: NEGATIVE
Glucose, UA: NEGATIVE
Ketones, UA: NEGATIVE
Leukocytes,UA: NEGATIVE
Nitrite, UA: NEGATIVE
Protein,UA: NEGATIVE
RBC, UA: NEGATIVE
Specific Gravity, UA: 1.02 (ref 1.005–1.030)
Urobilinogen, Ur: 1 mg/dL (ref 0.2–1.0)
pH, UA: 7 (ref 5.0–7.5)

## 2021-01-02 NOTE — Progress Notes (Signed)
EKG WNL. Results discussed with patient during visit.

## 2021-01-02 NOTE — Patient Instructions (Signed)
DUE TO COVID-19 ONLY ONE VISITOR IS ALLOWED TO COME WITH YOU AND STAY IN THE WAITING ROOM ONLY DURING PRE OP AND PROCEDURE.   **NO VISITORS ARE ALLOWED IN THE SHORT STAY AREA OR RECOVERY ROOM!!**  IF YOU WILL BE ADMITTED INTO THE HOSPITAL YOU ARE ALLOWED ONLY TWO SUPPORT PEOPLE DURING VISITATION HOURS ONLY (7 AM -8PM)   The support person(s) must pass our screening, gel in and out, and wear a mask at all times, including in the patient's room. Patients must also wear a mask when staff or their support person are in the room. Visitors GUEST BADGE MUST BE WORN VISIBLY  One adult visitor may remain with you overnight and MUST be in the room by 8 P.M.  No visitors under the age of 31. Any visitor under the age of 46 must be accompanied by an adult.    COVID SWAB TESTING MUST BE COMPLETED ON: 01/14/21  **MUST PRESENT COMPLETED FORM AT TESTING SITE**    [address] [place] (backside of the building) You are not required to quarantine, however you are required to wear a well-fitted mask when you are out and around people not in your household.  Hand Hygiene often Do NOT share personal items Notify your provider if you are in close contact with someone who has COVID or you develop fever 100.4 or greater, new onset of sneezing, cough, sore throat, shortness of breath or body aches.  [facility] Entr[facility] [address], [address], must go inside of the hospital, NOT A DRIVE THRU!  (Must self quarantine after testing. Follow instructions on handout.)       Your procedure is scheduled on: 01/16/21   Report to [facility] Main Entrance    Report to admitting at: 6:30 AM   Call this number if you have problems the morning of surgery [phone]   Do not eat food :After Midnight.   May have liquids until : 6:15 AM   day of surgery  CLEAR LIQUID DIET  Foods Allowed                                                                      Foods Excluded  Water, Black Coffee and tea, regular and decaf                             liquids that you cannot  Plain Jell-O in any flavor  (No red)                                           see through such as: Fruit ices (not with fruit pulp)                                     milk, soups, orange juice              Iced Popsicles (No red)  All solid food                                   Apple juices Sports drinks like Gatorade (No red) Lightly seasoned clear broth or consume(fat free) Sugar  Sample Menu Breakfast                                Lunch                                     Supper Cranberry juice                    Beef broth                            Chicken broth Jell-O                                     Grape juice                           Apple juice Coffee or tea                        Jell-O                                      Popsicle                                                Coffee or tea                        Coffee or tea     Complete one Ensure drink the morning of surgery at : 6:15 AM      the day of surgery.   The day of surgery:  Drink ONE (1) Pre-Surgery Clear Ensure or G2 by am the morning of surgery. Drink in one sitting. Do not sip.  This drink was given to you during your hospital  pre-op appointment visit. Nothing else to drink after completing the  Pre-Surgery Clear Ensure or G2.          If you have questions, please contact your surgeon's office.     Oral Hygiene is also important to reduce your risk of infection.                                    Remember - BRUSH YOUR TEETH THE MORNING OF SURGERY WITH YOUR REGULAR TOOTHPASTE   Do NOT smoke after Midnight   Take these medicines the morning of surgery with A SIP OF WATER: apresoline,atenolol,amlodipine.  DO NOT TAKE ANY ORAL DIABETIC MEDICATIONS DAY OF YOUR SURGERY  You may not have any metal on your body including  hair pins, jewelry, and body piercing             Do not wear make-up, lotions, powders, perfumes/cologne, or deodorant  Do not wear nail polish including gel and S&S, artificial/acrylic nails, or any other type of covering on natural nails including finger and toenails. If you have artificial nails, gel coating, etc. that needs to be removed by a nail salon please have this removed prior to surgery or surgery may need to be canceled/ delayed if the surgeon/ anesthesia feels like they are unable to be safely monitored.   Do not shave  48 hours prior to surgery.    Do not bring valuables to the hospital. [facility] IS NOT             RESPONSIBLE   FOR VALUABLES.   Contacts, dentures or bridgework may not be worn into surgery.   Bring small overnight bag day of surgery.    Patients discharged on the day of surgery will not be allowed to drive home.   Special Instructions: Bring a copy of your healthcare power of attorney and living will documents         the day of surgery if you haven't scanned them before.              Please read over the following fact sheets you were given: IF YOU HAVE QUESTIONS ABOUT YOUR PRE-OP INSTRUCTIONS PLEASE CALL [phone]     [facility] - Preparing for Surgery Before surgery, you can play an important role.  Because skin is not sterile, your skin needs to be as free of germs as possible.  You can reduce the number of germs on your skin by washing with CHG (chlorahexidine gluconate) soap before surgery.  CHG is an antiseptic cleaner which kills germs and bonds with the skin to continue killing germs even after washing. Please DO NOT use if you have an allergy to CHG or antibacterial soaps.  If your skin becomes reddened/irritated stop using the CHG and inform your nurse when you arrive at Short Stay. Do not shave (including legs and underarms) for at least 48 hours prior to the first CHG shower.  You may shave your face/neck. Please follow these instructions  carefully:  1.  Shower with CHG Soap the night before surgery and the  morning of Surgery.  2.  If you choose to wash your hair, wash your hair first as usual with your  normal  shampoo.  3.  After you shampoo, rinse your hair and body thoroughly to remove the  shampoo.                           4.  Use CHG as you would any other liquid soap.  You can apply chg directly  to the skin and wash                       Gently with a scrungie or clean washcloth.  5.  Apply the CHG Soap to your body ONLY FROM THE NECK DOWN.   Do not use on face/ open                           Wound or open sores. Avoid contact with eyes, ears mouth and genitals (private parts).  Wash face,  Genitals (private parts) with your normal soap.             6.  Wash thoroughly, paying special attention to the area where your surgery  will be performed.  7.  Thoroughly rinse your body with warm water from the neck down.  8.  DO NOT shower/wash with your normal soap after using and rinsing off  the CHG Soap.                9.  Pat yourself dry with a clean towel.            10.  Wear clean pajamas.            11.  Place clean sheets on your bed the night of your first shower and do not  sleep with pets. Day of Surgery : Do not apply any lotions/deodorants the morning of surgery.  Please wear clean clothes to the hospital/surgery center.  FAILURE TO FOLLOW THESE INSTRUCTIONS MAY RESULT IN THE CANCELLATION OF YOUR SURGERY PATIENT SIGNATURE_________________________________  NURSE SIGNATURE__________________________________  ________________________________________________________________________   Rhonda Stanley  An incentive spirometer is a tool that can help keep your lungs clear and active. This tool measures how well you are filling your lungs with each breath. Taking long deep breaths may help reverse or decrease the chance of developing breathing (pulmonary) problems (especially infection)  following: A long period of time when you are unable to move or be active. BEFORE THE PROCEDURE  If the spirometer includes an indicator to show your best effort, your nurse or respiratory therapist will set it to a desired goal. If possible, sit up straight or lean slightly forward. Try not to slouch. Hold the incentive spirometer in an upright position. INSTRUCTIONS FOR USE  Sit on the edge of your bed if possible, or sit up as far as you can in bed or on a chair. Hold the incentive spirometer in an upright position. Breathe out normally. Place the mouthpiece in your mouth and seal your lips tightly around it. Breathe in slowly and as deeply as possible, raising the piston or the ball toward the top of the column. Hold your breath for 3-5 seconds or for as long as possible. Allow the piston or ball to fall to the bottom of the column. Remove the mouthpiece from your mouth and breathe out normally. Rest for a few seconds and repeat Steps 1 through 7 at least 10 times every 1-2 hours when you are awake. Take your time and take a few normal breaths between deep breaths. The spirometer may include an indicator to show your best effort. Use the indicator as a goal to work toward during each repetition. After each set of 10 deep breaths, practice coughing to be sure your lungs are clear. If you have an incision (the cut made at the time of surgery), support your incision when coughing by placing a pillow or rolled up towels firmly against it. Once you are able to get out of bed, walk around indoors and cough well. You may stop using the incentive spirometer when instructed by your caregiver.  RISKS AND COMPLICATIONS Take your time so you do not get dizzy or light-headed. If you are in pain, you may need to take or ask for pain medication before doing incentive spirometry. It is harder to take a deep breath if you are having pain. AFTER USE Rest and breathe slowly and easily. It can be helpful to  keep track  of a log of your progress. Your caregiver can provide you with a simple table to help with this. If you are using the spirometer at home, follow these instructions: [place] IF:  You are having difficultly using the spirometer. You have trouble using the spirometer as often as instructed. Your pain medication is not giving enough relief while using the spirometer. You develop fever of 100.5 F (38.1 C) or higher. SEEK IMMEDIATE MEDICAL CARE IF:  You cough up bloody sputum that had not been present before. You develop fever of 102 F (38.9 C) or greater. You develop worsening pain at or near the incision site. MAKE SURE YOU:  Understand these instructions. Will watch your condition. Will get help right away if you are not doing well or get worse. Document Released: 06/02/2006 Document Revised: 04/14/2011 Document Reviewed: 08/03/2006 [facility] Patient Information 2014 [place], [place].   ________________________________________________________________________

## 2021-01-02 NOTE — Progress Notes (Signed)
Hi Ms. Kirston. Your urine from today looks good.  I will send you another message once the rest of your lab work comes back.

## 2021-01-02 NOTE — Assessment & Plan Note (Signed)
Chronic.  Controlled.  Continue with current medication regimen.  Labs ordered today.  Return to clinic in 6 months for reevaluation.  Call sooner if concerns arise.  ? ?

## 2021-01-02 NOTE — Assessment & Plan Note (Signed)
Surgery will be performed on 01/16/21.

## 2021-01-03 ENCOUNTER — Other Ambulatory Visit: Payer: Self-pay

## 2021-01-03 ENCOUNTER — Encounter (HOSPITAL_COMMUNITY)
Admission: RE | Admit: 2021-01-03 | Discharge: 2021-01-03 | Disposition: A | Discharge status: Home / Self Care | Payer: Commercial Managed Care - PPO | Source: Ambulatory Visit | Attending: Orthopedic Surgery | Admitting: Orthopedic Surgery

## 2021-01-03 ENCOUNTER — Encounter (HOSPITAL_COMMUNITY): Payer: Self-pay

## 2021-01-03 VITALS — BP 143/99 | HR 65 | Temp 98.1°F | Ht 63.0 in | Wt 181.4 lb

## 2021-01-03 DIAGNOSIS — Z01818 Encounter for other preprocedural examination: Secondary | ICD-10-CM | POA: Diagnosis present

## 2021-01-03 DIAGNOSIS — Y838 Other surgical procedures as the cause of abnormal reaction of the patient, or of later complication, without mention of misadventure at the time of the procedure: Secondary | ICD-10-CM | POA: Diagnosis not present

## 2021-01-03 DIAGNOSIS — M25561 Pain in right knee: Secondary | ICD-10-CM | POA: Diagnosis not present

## 2021-01-03 DIAGNOSIS — T84012A Broken internal right knee prosthesis, initial encounter: Secondary | ICD-10-CM | POA: Diagnosis not present

## 2021-01-03 LAB — CBC WITH DIFFERENTIAL/PLATELET
Basophils Absolute: 0 10*3/uL (ref 0.0–0.2)
Basos: 1 %
EOS (ABSOLUTE): 0.1 10*3/uL (ref 0.0–0.4)
Eos: 2 %
Hematocrit: 43.6 % (ref 34.0–46.6)
Hemoglobin: 13.6 g/dL (ref 11.1–15.9)
Immature Grans (Abs): 0 10*3/uL (ref 0.0–0.1)
Immature Granulocytes: 0 %
Lymphocytes Absolute: 1.1 10*3/uL (ref 0.7–3.1)
Lymphs: 21 %
MCH: 25.6 pg — ABNORMAL LOW (ref 26.6–33.0)
MCHC: 31.2 g/dL — ABNORMAL LOW (ref 31.5–35.7)
MCV: 82 fL (ref 79–97)
Monocytes Absolute: 0.4 10*3/uL (ref 0.1–0.9)
Monocytes: 8 %
Neutrophils Absolute: 3.8 10*3/uL (ref 1.4–7.0)
Neutrophils: 68 %
Platelets: 382 10*3/uL (ref 150–450)
RBC: 5.32 x10E6/uL — ABNORMAL HIGH (ref 3.77–5.28)
RDW: 13.4 % (ref 11.7–15.4)
WBC: 5.5 10*3/uL (ref 3.4–10.8)

## 2021-01-03 LAB — LIPID PANEL
Chol/HDL Ratio: 3.9 ratio (ref 0.0–4.4)
Cholesterol, Total: 210 mg/dL — ABNORMAL HIGH (ref 100–199)
HDL: 54 mg/dL (ref >39)
LDL Chol Calc (NIH): 140 mg/dL — ABNORMAL HIGH (ref 0–99)
Triglycerides: 91 mg/dL (ref 0–149)
VLDL Cholesterol Cal: 16 mg/dL (ref 5–40)

## 2021-01-03 LAB — COMPREHENSIVE METABOLIC PANEL
ALT: 17 IU/L (ref 0–32)
AST: 16 IU/L (ref 0–40)
Albumin/Globulin Ratio: 1.7 (ref 1.2–2.2)
Albumin: 4.8 g/dL (ref 3.8–4.9)
Alkaline Phosphatase: 130 IU/L — ABNORMAL HIGH (ref 44–121)
BUN/Creatinine Ratio: 25 — ABNORMAL HIGH (ref 9–23)
BUN: 18 mg/dL (ref 6–24)
Bilirubin Total: 1.2 mg/dL (ref 0.0–1.2)
CO2: 24 mmol/L (ref 20–29)
Calcium: 10.4 mg/dL — ABNORMAL HIGH (ref 8.7–10.2)
Chloride: 99 mmol/L (ref 96–106)
Creatinine, Ser: 0.72 mg/dL (ref 0.57–1.00)
Globulin, Total: 2.8 g/dL (ref 1.5–4.5)
Glucose: 98 mg/dL (ref 70–99)
Potassium: 4.2 mmol/L (ref 3.5–5.2)
Sodium: 140 mmol/L (ref 134–144)
Total Protein: 7.6 g/dL (ref 6.0–8.5)
eGFR: 100 mL/min/{1.73_m2} (ref >59)

## 2021-01-03 LAB — SURGICAL PCR SCREEN
MRSA, PCR: NEGATIVE
Staphylococcus aureus: NEGATIVE

## 2021-01-03 NOTE — Progress Notes (Signed)
COVID Vaccine Completed: Yes Date COVID Vaccine completed: 03/08/20 x 3 COVID vaccine manufacturer: Pfizer      COVID Test:01/14/21 PCP - Larae Grooms: NP. LOV: 12/19/20 Cardiologist -   Chest x-ray -  EKG - 01/02/21 EPIC Stress Test -  ECHO -  Cardiac Cath -  Pacemaker/ICD device last checked:  Sleep Study -  CPAP -   Fasting Blood Sugar -  Checks Blood Sugar _____ times a day  Blood Thinner Instructions: Aspirin Instructions: Last Dose:  Anesthesia review: Hx: HTN  Patient denies shortness of breath, fever, cough and chest pain at PAT appointment   Patient verbalized understanding of instructions that were given to them at the PAT appointment. Patient was also instructed that they will need to review over the PAT instructions again at home before surgery.

## 2021-01-03 NOTE — Progress Notes (Signed)
Please let patient know that overall her lab work looks good.  She needs to make sure that she is well hydrated.  Please let me know if she has any questions.

## 2021-01-10 ENCOUNTER — Other Ambulatory Visit: Payer: Self-pay | Admitting: Nurse Practitioner

## 2021-01-14 ENCOUNTER — Other Ambulatory Visit: Payer: Self-pay | Admitting: Orthopedic Surgery

## 2021-01-14 LAB — SARS CORONAVIRUS 2 (TAT 6-24 HRS): SARS Coronavirus 2: NEGATIVE

## 2021-01-16 ENCOUNTER — Inpatient Hospital Stay (HOSPITAL_COMMUNITY): Payer: Commercial Managed Care - PPO

## 2021-01-16 ENCOUNTER — Encounter (HOSPITAL_COMMUNITY): Payer: Self-pay | Admitting: Orthopedic Surgery

## 2021-01-16 ENCOUNTER — Inpatient Hospital Stay (HOSPITAL_COMMUNITY)
Admission: RE | Admit: 2021-01-16 | Discharge: 2021-01-17 | DRG: 468 | Disposition: A | Discharge status: Home Health | Payer: Commercial Managed Care - PPO | Attending: Orthopedic Surgery | Admitting: Orthopedic Surgery

## 2021-01-16 ENCOUNTER — Other Ambulatory Visit: Payer: Self-pay

## 2021-01-16 ENCOUNTER — Encounter (HOSPITAL_COMMUNITY)
Admission: RE | Disposition: A | Discharge status: Home Health | Payer: Self-pay | Source: Home / Self Care | Attending: Orthopedic Surgery

## 2021-01-16 ENCOUNTER — Inpatient Hospital Stay (HOSPITAL_COMMUNITY): Payer: Commercial Managed Care - PPO | Admitting: Anesthesiology

## 2021-01-16 DIAGNOSIS — Z96659 Presence of unspecified artificial knee joint: Principal | ICD-10-CM

## 2021-01-16 DIAGNOSIS — M1711 Unilateral primary osteoarthritis, right knee: Secondary | ICD-10-CM | POA: Diagnosis present

## 2021-01-16 DIAGNOSIS — I10 Essential (primary) hypertension: Secondary | ICD-10-CM | POA: Diagnosis present

## 2021-01-16 DIAGNOSIS — Y792 Prosthetic and other implants, materials and accessory orthopedic devices associated with adverse incidents: Secondary | ICD-10-CM | POA: Diagnosis present

## 2021-01-16 DIAGNOSIS — Z791 Long term (current) use of non-steroidal anti-inflammatories (NSAID): Secondary | ICD-10-CM

## 2021-01-16 DIAGNOSIS — Z975 Presence of (intrauterine) contraceptive device: Secondary | ICD-10-CM | POA: Diagnosis not present

## 2021-01-16 DIAGNOSIS — M96671 Fracture of tibia or fibula following insertion of orthopedic implant, joint prosthesis, or bone plate, right leg: Secondary | ICD-10-CM | POA: Diagnosis present

## 2021-01-16 DIAGNOSIS — M25761 Osteophyte, right knee: Secondary | ICD-10-CM | POA: Diagnosis present

## 2021-01-16 DIAGNOSIS — Z8249 Family history of ischemic heart disease and other diseases of the circulatory system: Secondary | ICD-10-CM

## 2021-01-16 DIAGNOSIS — T84018A Broken internal joint prosthesis, other site, initial encounter: Secondary | ICD-10-CM

## 2021-01-16 DIAGNOSIS — Z419 Encounter for procedure for purposes other than remedying health state, unspecified: Secondary | ICD-10-CM

## 2021-01-16 DIAGNOSIS — Z9889 Other specified postprocedural states: Secondary | ICD-10-CM

## 2021-01-16 DIAGNOSIS — T84032A Mechanical loosening of internal right knee prosthetic joint, initial encounter: Secondary | ICD-10-CM | POA: Diagnosis present

## 2021-01-16 DIAGNOSIS — Z79899 Other long term (current) drug therapy: Secondary | ICD-10-CM

## 2021-01-16 HISTORY — PX: TOTAL KNEE ARTHROPLASTY WITH REVISION COMPONENTS: SHX6198

## 2021-01-16 HISTORY — PX: ORIF TIBIA PLATEAU: SHX2132

## 2021-01-16 LAB — TYPE AND SCREEN
ABO/RH(D): B POS
Antibody Screen: NEGATIVE

## 2021-01-16 LAB — ABO/RH: ABO/RH(D): B POS

## 2021-01-16 LAB — HCG, SERUM, QUALITATIVE: Preg, Serum: NEGATIVE

## 2021-01-16 SURGERY — TOTAL KNEE ARTHROPLASTY WITH REVISION COMPONENTS
Anesthesia: Spinal | Site: Knee | Laterality: Right

## 2021-01-16 MED ORDER — METOCLOPRAMIDE HCL 5 MG PO TABS: 5.0000 mg | ORAL_TABLET | Freq: Three times a day (TID) | ORAL | Status: DC | PRN

## 2021-01-16 MED ORDER — MIDAZOLAM HCL 2 MG/2ML IJ SOLN
1.0000 mg | INTRAMUSCULAR | Status: DC
  Administered 2021-01-16: 10:00:00 2 mg via INTRAVENOUS
  Filled 2021-01-16: qty 2

## 2021-01-16 MED ORDER — BUPIVACAINE IN DEXTROSE 0.75-8.25 % IT SOLN
INTRATHECAL | Status: DC | PRN
  Administered 2021-01-16: 2 mL via INTRATHECAL

## 2021-01-16 MED ORDER — PROPOFOL 500 MG/50ML IV EMUL
INTRAVENOUS | Status: DC | PRN
  Administered 2021-01-16 (×2): 100 ug/kg/min via INTRAVENOUS

## 2021-01-16 MED ORDER — MENTHOL 3 MG MT LOZG: 1.0000 | LOZENGE | OROMUCOSAL | Status: DC | PRN

## 2021-01-16 MED ORDER — HYDROCHLOROTHIAZIDE 12.5 MG PO TABS
12.5000 mg | ORAL_TABLET | Freq: Every day | ORAL | Status: DC
  Administered 2021-01-17: 12.5 mg via ORAL
  Filled 2021-01-16: qty 1

## 2021-01-16 MED ORDER — DOCUSATE SODIUM 100 MG PO CAPS
100.0000 mg | ORAL_CAPSULE | Freq: Two times a day (BID) | ORAL | Status: DC
  Administered 2021-01-16 – 2021-01-17 (×2): 100 mg via ORAL
  Filled 2021-01-16 (×2): qty 1

## 2021-01-16 MED ORDER — CEFAZOLIN SODIUM-DEXTROSE 2-4 GM/100ML-% IV SOLN
2.0000 g | INTRAVENOUS | Status: AC
  Administered 2021-01-16 (×2): 2 g via INTRAVENOUS
  Filled 2021-01-16: qty 100

## 2021-01-16 MED ORDER — ACETAMINOPHEN 500 MG PO TABS
1000.0000 mg | ORAL_TABLET | Freq: Four times a day (QID) | ORAL | Status: DC
  Administered 2021-01-16 – 2021-01-17 (×3): 1000 mg via ORAL
  Filled 2021-01-16 (×3): qty 2

## 2021-01-16 MED ORDER — ORAL CARE MOUTH RINSE: 15.0000 mL | Freq: Once | OROMUCOSAL | Status: AC

## 2021-01-16 MED ORDER — FENTANYL CITRATE (PF) 100 MCG/2ML IJ SOLN
INTRAMUSCULAR | Status: AC
  Filled 2021-01-16: qty 2

## 2021-01-16 MED ORDER — HYDROMORPHONE HCL 1 MG/ML IJ SOLN
INTRAMUSCULAR | Status: AC
  Administered 2021-01-16: 17:00:00 0.5 mg via INTRAVENOUS
  Filled 2021-01-16: qty 1

## 2021-01-16 MED ORDER — HYDROMORPHONE HCL 1 MG/ML IJ SOLN
0.2500 mg | INTRAMUSCULAR | Status: DC | PRN
  Administered 2021-01-16: 17:00:00 0.5 mg via INTRAVENOUS

## 2021-01-16 MED ORDER — PHENOL 1.4 % MT LIQD: 1.0000 | OROMUCOSAL | Status: DC | PRN

## 2021-01-16 MED ORDER — DEXAMETHASONE SODIUM PHOSPHATE 10 MG/ML IJ SOLN
INTRAMUSCULAR | Status: DC | PRN
  Administered 2021-01-16: 8 mg via INTRAVENOUS

## 2021-01-16 MED ORDER — METHOCARBAMOL 500 MG IVPB - SIMPLE MED
500.0000 mg | Freq: Four times a day (QID) | INTRAVENOUS | Status: DC | PRN
  Administered 2021-01-16: 17:00:00 500 mg via INTRAVENOUS
  Filled 2021-01-16: qty 50

## 2021-01-16 MED ORDER — LIDOCAINE 2% (20 MG/ML) 5 ML SYRINGE
INTRAMUSCULAR | Status: DC | PRN
Start: 2021-01-16 — End: 2021-01-16
  Administered 2021-01-16: 20 mg via INTRAVENOUS

## 2021-01-16 MED ORDER — BUPIVACAINE-EPINEPHRINE (PF) 0.5% -1:200000 IJ SOLN
INTRAMUSCULAR | Status: DC | PRN
  Administered 2021-01-16: 20 mL via PERINEURAL

## 2021-01-16 MED ORDER — AMLODIPINE BESYLATE 10 MG PO TABS
10.0000 mg | ORAL_TABLET | Freq: Every day | ORAL | Status: DC
  Administered 2021-01-17: 10 mg via ORAL
  Filled 2021-01-16: qty 1

## 2021-01-16 MED ORDER — IRBESARTAN 150 MG PO TABS
300.0000 mg | ORAL_TABLET | Freq: Every day | ORAL | Status: DC
  Administered 2021-01-17: 300 mg via ORAL
  Filled 2021-01-16: qty 2

## 2021-01-16 MED ORDER — ASPIRIN 81 MG PO CHEW
81.0000 mg | CHEWABLE_TABLET | Freq: Two times a day (BID) | ORAL | Status: DC
  Administered 2021-01-16 – 2021-01-17 (×2): 81 mg via ORAL
  Filled 2021-01-16 (×2): qty 1

## 2021-01-16 MED ORDER — TRANEXAMIC ACID 1000 MG/10ML IV SOLN
2000.0000 mg | INTRAVENOUS | Status: DC
  Filled 2021-01-16: qty 20

## 2021-01-16 MED ORDER — PHENYLEPHRINE 40 MCG/ML (10ML) SYRINGE FOR IV PUSH (FOR BLOOD PRESSURE SUPPORT)
PREFILLED_SYRINGE | INTRAVENOUS | Status: DC | PRN
  Administered 2021-01-16 (×2): 80 ug via INTRAVENOUS

## 2021-01-16 MED ORDER — BUPIVACAINE LIPOSOME 1.3 % IJ SUSP
INTRAMUSCULAR | Status: AC
  Filled 2021-01-16: qty 20

## 2021-01-16 MED ORDER — CHLORHEXIDINE GLUCONATE 0.12 % MT SOLN
15.0000 mL | Freq: Once | OROMUCOSAL | Status: AC
  Administered 2021-01-16: 10:00:00 15 mL via OROMUCOSAL

## 2021-01-16 MED ORDER — ONDANSETRON HCL 4 MG/2ML IJ SOLN: 4.0000 mg | Freq: Four times a day (QID) | INTRAMUSCULAR | Status: DC | PRN

## 2021-01-16 MED ORDER — TRANEXAMIC ACID-NACL 1000-0.7 MG/100ML-% IV SOLN
1000.0000 mg | INTRAVENOUS | Status: AC
  Administered 2021-01-16: 12:00:00 1000 mg via INTRAVENOUS
  Filled 2021-01-16: qty 100

## 2021-01-16 MED ORDER — POVIDONE-IODINE 10 % EX SWAB
2.0000 "application " | Freq: Once | CUTANEOUS | Status: AC
  Administered 2021-01-16: 2 via TOPICAL

## 2021-01-16 MED ORDER — ACETAMINOPHEN 500 MG PO TABS
1000.0000 mg | ORAL_TABLET | Freq: Once | ORAL | Status: AC
  Administered 2021-01-16: 10:00:00 1000 mg via ORAL
  Filled 2021-01-16: qty 2

## 2021-01-16 MED ORDER — SODIUM CHLORIDE 0.9 % IR SOLN
Status: DC | PRN
  Administered 2021-01-16 (×2): 3000 mL

## 2021-01-16 MED ORDER — FENTANYL CITRATE PF 50 MCG/ML IJ SOSY
50.0000 ug | PREFILLED_SYRINGE | INTRAMUSCULAR | Status: DC
  Administered 2021-01-16: 10:00:00 100 ug via INTRAVENOUS
  Filled 2021-01-16: qty 2

## 2021-01-16 MED ORDER — METOCLOPRAMIDE HCL 5 MG/ML IJ SOLN: 5.0000 mg | Freq: Three times a day (TID) | INTRAMUSCULAR | Status: DC | PRN

## 2021-01-16 MED ORDER — BUPIVACAINE LIPOSOME 1.3 % IJ SUSP: 20.0000 mL | Freq: Once | INTRAMUSCULAR | Status: DC

## 2021-01-16 MED ORDER — ONDANSETRON HCL 4 MG/2ML IJ SOLN
INTRAMUSCULAR | Status: DC | PRN
  Administered 2021-01-16: 4 mg via INTRAVENOUS

## 2021-01-16 MED ORDER — CEFAZOLIN SODIUM-DEXTROSE 2-4 GM/100ML-% IV SOLN
2.0000 g | Freq: Three times a day (TID) | INTRAVENOUS | Status: AC
  Administered 2021-01-17 (×2): 2 g via INTRAVENOUS
  Filled 2021-01-16 (×2): qty 100

## 2021-01-16 MED ORDER — WATER FOR IRRIGATION, STERILE IR SOLN
Status: DC | PRN
  Administered 2021-01-16: 2000 mL

## 2021-01-16 MED ORDER — IRBESARTAN-HYDROCHLOROTHIAZIDE 300-12.5 MG PO TABS: 1.0000 | ORAL_TABLET | Freq: Every day | ORAL | Status: DC

## 2021-01-16 MED ORDER — CEFAZOLIN SODIUM 1 G IJ SOLR
INTRAMUSCULAR | Status: AC
  Filled 2021-01-16: qty 20

## 2021-01-16 MED ORDER — METHOCARBAMOL 500 MG PO TABS
500.0000 mg | ORAL_TABLET | Freq: Four times a day (QID) | ORAL | Status: DC | PRN
  Administered 2021-01-17: 500 mg via ORAL
  Filled 2021-01-16 (×2): qty 1

## 2021-01-16 MED ORDER — PROPOFOL 1000 MG/100ML IV EMUL
INTRAVENOUS | Status: AC
  Filled 2021-01-16: qty 100

## 2021-01-16 MED ORDER — SODIUM CHLORIDE 0.9 % IV SOLN: INTRAVENOUS | Status: DC

## 2021-01-16 MED ORDER — SODIUM CHLORIDE 0.9% FLUSH
INTRAVENOUS | Status: DC | PRN
  Administered 2021-01-16: 40 mL

## 2021-01-16 MED ORDER — ATENOLOL 50 MG PO TABS
50.0000 mg | ORAL_TABLET | Freq: Every day | ORAL | Status: DC
  Administered 2021-01-17: 50 mg via ORAL
  Filled 2021-01-16: qty 1

## 2021-01-16 MED ORDER — OXYCODONE HCL 5 MG PO TABS
10.0000 mg | ORAL_TABLET | ORAL | Status: DC | PRN
  Filled 2021-01-16: qty 2

## 2021-01-16 MED ORDER — OXYCODONE HCL 5 MG PO TABS
5.0000 mg | ORAL_TABLET | ORAL | Status: DC | PRN
  Administered 2021-01-16 – 2021-01-17 (×3): 10 mg via ORAL
  Filled 2021-01-16 (×2): qty 2

## 2021-01-16 MED ORDER — METHOCARBAMOL 500 MG IVPB - SIMPLE MED
INTRAVENOUS | Status: AC
  Filled 2021-01-16: qty 50

## 2021-01-16 MED ORDER — FENTANYL CITRATE (PF) 100 MCG/2ML IJ SOLN
INTRAMUSCULAR | Status: DC | PRN
  Administered 2021-01-16: 25 ug via INTRAVENOUS
  Administered 2021-01-16: 50 ug via INTRAVENOUS
  Administered 2021-01-16 (×2): 25 ug via INTRAVENOUS
  Administered 2021-01-16: 50 ug via INTRAVENOUS
  Administered 2021-01-16: 25 ug via INTRAVENOUS

## 2021-01-16 MED ORDER — HYDROMORPHONE HCL 1 MG/ML IJ SOLN: 0.5000 mg | INTRAMUSCULAR | Status: DC | PRN

## 2021-01-16 MED ORDER — ONDANSETRON HCL 4 MG PO TABS: 4.0000 mg | ORAL_TABLET | Freq: Four times a day (QID) | ORAL | Status: DC | PRN

## 2021-01-16 MED ORDER — 0.9 % SODIUM CHLORIDE (POUR BTL) OPTIME
TOPICAL | Status: DC | PRN
  Administered 2021-01-16: 13:00:00 1000 mL

## 2021-01-16 MED ORDER — LACTATED RINGERS IV SOLN: INTRAVENOUS | Status: DC

## 2021-01-16 MED ORDER — KETOROLAC TROMETHAMINE 15 MG/ML IJ SOLN
15.0000 mg | Freq: Four times a day (QID) | INTRAMUSCULAR | Status: DC
  Administered 2021-01-16 – 2021-01-17 (×3): 15 mg via INTRAVENOUS
  Filled 2021-01-16 (×3): qty 1

## 2021-01-16 MED ORDER — ATORVASTATIN CALCIUM 20 MG PO TABS
20.0000 mg | ORAL_TABLET | Freq: Every day | ORAL | Status: DC
  Administered 2021-01-17: 20 mg via ORAL
  Filled 2021-01-16: qty 1

## 2021-01-16 MED ORDER — BUPIVACAINE LIPOSOME 1.3 % IJ SUSP
INTRAMUSCULAR | Status: DC | PRN
  Administered 2021-01-16: 20 mL

## 2021-01-16 SURGICAL SUPPLY — 65 items
3.5MM SCREWX65 IMPLANT
AUG TIB HALF BLOCK CD 10 (Joint) ×4 IMPLANT
AUGMENT TIB HALF BLC CD 10 (Joint) IMPLANT
BAG COUNTER SPONGE SURGICOUNT (BAG) IMPLANT
BAG SURGICOUNT SPONGE COUNTING (BAG)
BIT DRILL UNIV LOCK 2.5 (BIT) ×2 IMPLANT
BLADE HEX COATED 2.75 (ELECTRODE) ×4 IMPLANT
BLADE SAG 18X100X1.27 (BLADE) ×4 IMPLANT
BLADE SAW RECIPROCATING 77.5 (BLADE) ×2 IMPLANT
BLADE SAW SAG 13X.89X90 PERFRM (BLADE) ×2 IMPLANT
BLADE SAW SAG 35X64 .89 (BLADE) ×4 IMPLANT
BNDG ELASTIC 6X10 VLCR STRL LF (GAUZE/BANDAGES/DRESSINGS) ×4 IMPLANT
BOWL SMART MIX CTS (DISPOSABLE) ×4 IMPLANT
BRUSH FEMORAL CANAL (MISCELLANEOUS) ×2 IMPLANT
CEMENT BONE REFOBACIN R1X40 US (Cement) ×6 IMPLANT
CHLORAPREP W/TINT 26 (MISCELLANEOUS) ×8 IMPLANT
COMP FEM CMT CR PERS SZ8 LT (Joint) IMPLANT
COMP FEM CMT CR PERS SZ8 RT (Joint) ×4 IMPLANT
COMP TIB PERS KNEE SZ D RT (Joint) ×4 IMPLANT
COMPONENT FEM CMT CR PRS SZ8LT (Joint) IMPLANT
COMPONENT FEM CMT CR PRS SZ8RT (Joint) IMPLANT
COMPONENT TIB PERS KN SZD RT (Joint) IMPLANT
COOLER ICEMAN CLASSIC (MISCELLANEOUS) IMPLANT
COVER SURGICAL LIGHT HANDLE (MISCELLANEOUS) ×4 IMPLANT
CUFF TOURN SGL QUICK 34 (TOURNIQUET CUFF) ×2
CUFF TRNQT CYL 34X4.125X (TOURNIQUET CUFF) ×2 IMPLANT
DECANTER SPIKE VIAL GLASS SM (MISCELLANEOUS) ×4 IMPLANT
DERMABOND ADVANCED (GAUZE/BANDAGES/DRESSINGS) ×2
DERMABOND ADVANCED .7 DNX12 (GAUZE/BANDAGES/DRESSINGS) ×2 IMPLANT
DRAPE INCISE IOBAN 66X45 STRL (DRAPES) IMPLANT
DRAPE INCISE IOBAN 85X60 (DRAPES) ×4 IMPLANT
DRAPE SHEET LG 3/4 BI-LAMINATE (DRAPES) ×4 IMPLANT
DRAPE U-SHAPE 47X51 STRL (DRAPES) ×4 IMPLANT
DRESSING AQUACEL AG SP 3.5X10 (GAUZE/BANDAGES/DRESSINGS) ×2 IMPLANT
DRSG AQUACEL AG SP 3.5X10 (GAUZE/BANDAGES/DRESSINGS) ×4
GLOVE SRG 8 PF TXTR STRL LF DI (GLOVE) ×2 IMPLANT
GLOVE SURG ENC MOIS LTX SZ8 (GLOVE) ×8 IMPLANT
GLOVE SURG UNDER POLY LF SZ8 (GLOVE) ×2
GOWN STRL REUS W/TWL XL LVL3 (GOWN DISPOSABLE) ×4 IMPLANT
HANDPIECE INTERPULSE COAX TIP (DISPOSABLE) ×2
HOOD PEEL AWAY FLYTE STAYCOOL (MISCELLANEOUS) ×12 IMPLANT
INSERT TIB ASF CD/8-9 12 RT (Insert) ×2 IMPLANT
INSERT TIB PS CMTLS CC FX (Insert) ×2 IMPLANT
IRRIGATION SURGIPHOR STRL (IV SOLUTION) ×4 IMPLANT
MANIFOLD NEPTUNE II (INSTRUMENTS) ×4 IMPLANT
MARKER SKIN DUAL TIP RULER LAB (MISCELLANEOUS) ×8 IMPLANT
NS IRRIG 1000ML POUR BTL (IV SOLUTION) ×4 IMPLANT
PACK TOTAL KNEE CUSTOM (KITS) ×4 IMPLANT
PAD COLD SHLDR WRAP-ON (PAD) IMPLANT
PROTECTOR NERVE ULNAR (MISCELLANEOUS) ×4 IMPLANT
SCREW HEX HEADED 3.5X27 DISP (ORTHOPEDIC DISPOSABLE SUPPLIES) ×2 IMPLANT
SCREW LOCK FT ST SFX 3.5X65 (Screw) IMPLANT
SET HNDPC FAN SPRY TIP SCT (DISPOSABLE) ×2 IMPLANT
STEM FEM EXT PERS 75X14 (Stem) ×2 IMPLANT
STEM POLY PAT PLY 29M KNEE (Knees) ×2 IMPLANT
SUT MNCRL AB 3-0 PS2 18 (SUTURE) ×4 IMPLANT
SUT STRATAFIX 0 PDS 27 VIOLET (SUTURE) ×4
SUT STRATAFIX 1PDS 45CM VIOLET (SUTURE) ×6 IMPLANT
SUT VIC AB 2-0 CT2 27 (SUTURE) ×8 IMPLANT
SUTURE STRATFX 0 PDS 27 VIOLET (SUTURE) ×2 IMPLANT
SYR 50ML LL SCALE MARK (SYRINGE) ×4 IMPLANT
TRAY FOLEY MTR SLVR 14FR STAT (SET/KITS/TRAYS/PACK) IMPLANT
TRAY FOLEY MTR SLVR 16FR STAT (SET/KITS/TRAYS/PACK) IMPLANT
TUBE SUCTION HIGH CAP CLEAR NV (SUCTIONS) ×4 IMPLANT
WRAP KNEE MAXI GEL POST OP (GAUZE/BANDAGES/DRESSINGS) ×2 IMPLANT

## 2021-01-16 NOTE — Anesthesia Procedure Notes (Signed)
Anesthesia Regional Block: Adductor canal block   Pre-Anesthetic Checklist: , timeout performed,  Correct Patient, Correct Site, Correct Laterality,  Correct Procedure, Correct Position, site marked,  Risks and benefits discussed,  Pre-op evaluation,  At surgeon's request and post-op pain management  Laterality: Right  Prep: Maximum Sterile Barrier Precautions used, chloraprep       Needles:  Injection technique: Single-shot  Needle Type: Echogenic Stimulator Needle     Needle Length: 9cm  Needle Gauge: 21     Additional Needles:   Procedures:,,,, ultrasound used (permanent image in chart),,    Narrative:  Start time: 01/16/2021 10:10 AM End time: 01/16/2021 10:20 AM Injection made incrementally with aspirations every 5 mL.  Performed by: Personally  Anesthesiologist: Gaynelle Adu, MD

## 2021-01-16 NOTE — Op Note (Addendum)
DATE OF SURGERY:  01/16/2021 TIME: 4:32 PM  PATIENT NAME:  Rhonda Stanley   AGE: 55 y.o.    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  Screw fixation of the medial plateau fracture.  Standard CR femoral component with MC bearing  SURGEON:  Naydelin Ziegler A Jodey Burbano, MD   ASSISTANT: Darron Doom, RNFA, present and scrubbed throughout the case, critical for assistance with exposure, retraction, instrumentation, and closure.   OPERATIVE IMPLANTS:  Implant Name Type Inv. Item Serial No. Manufacturer Lot No. LRB No. Used Action  CEMENT BONE REFOBACIN [id number] [place] - [id number] Cement CEMENT BONE REFOBACIN [id number] [place]  ZIMMER RECON(ORTH,TRAU,BIO,SG) [id number] Right 3 Implanted  INSERT TIB PS CMTLS CC FX - [id number] Insert INSERT TIB PS CMTLS CC FX  ZIMMER RECON(ORTH,TRAU,BIO,SG) [id number] Right 1 Implanted  COMP TIB PERS KNEE SZ D RT - [id number] Joint COMP TIB PERS KNEE SZ D RT  ZIMMER RECON(ORTH,TRAU,BIO,SG) [id number] Right 1 Implanted  TIBIAL AUGMENT CEMENTED HALF BLOCK     [id number] Right 1 Implanted  STEM FEM EXT PERS 75X14 - [id number] Stem STEM FEM EXT PERS 75X14  ZIMMER RECON(ORTH,TRAU,BIO,SG) [id number] Right 1 Implanted  STEM POLY PAT PLY 53M KNEE - [id number] Knees STEM POLY PAT PLY 53M KNEE  ZIMMER RECON(ORTH,TRAU,BIO,SG) [id number] Right 1 Implanted  COMP FEM CMT CR PERS SZ8 RT - [id number] Joint COMP FEM CMT CR PERS SZ8 RT  ZIMMER RECON(ORTH,TRAU,BIO,SG) [id number] Right 1 Implanted  CROSSLINKED POLYETHLENE     [id number] Right 1 Implanted      PREOPERATIVE INDICATIONS:  Rhonda Stanley is a 55 y.o. year old female with progressive loosening, subsidence, and plateau fracture following right  medial UKA 1 year ago.  Synovasure aspiration negative for infection.  Given failure for partial knee arthroplasty conversion to total knee arthroplasty with revision components was recommended.     The risks, benefits, and alternatives were discussed at length including but not limited to the risks of infection, bleeding, nerve injury, stiffness, blood clots, the need for revision surgery, cardiopulmonary complications, among others, and they were willing to proceed.  OPERATIVE FINDINGS AND UNIQUE ASPECTS OF THE CASE: Overly distalized and posteriorly placed femoral component that was well fixed.  Tibial tray was grossly loose.  A medial plateau fracture was present that had not united.  Significant posterior medial bone loss.   ESTIMATED BLOOD LOSS: 200cc  OPERATIVE DESCRIPTION:   Once adequate anesthesia, preoperative antibiotics, 2 gm of Ancef,1 gm of Tranexamic Acid, and 8 mg of Decadron administered, the patient was positioned supine with a right thigh tourniquet placed.  The right lower extremity was prepped and draped in sterile fashion.  A time-  out was performed identifying the patient, planned procedure, and the appropriate extremity.     The leg was  exsanguinated, tourniquet elevated to 250 mmHg.  A midline incision was  made utilizing her old scar and extending proximally and distally followed by median parapatellar arthrotomy.  A synovectomy along the medial capsule was performed. A medial release was performed, the infrapatellar fat pad was resected with care taken to protect the patellar tendon. The suprapatellar fat was removed to exposed the distal anterior femur. The anterior horn of the lateral meniscus and ACL were released.  The medial plateau fracture was  exposed and visualized about the medial release.  To stabilize the fracture as the femur and tibia would be prepared a 3.5 millimeter screw was placed medial to anteriolateral across the fracture which provided good  stability  Following initial  exposure, attention was first to the femur.  The femoral canal was opened with a drill, irrigated to try to prevent fat emboli.  An  intramedullary rod was passed set at 5 degrees valgus, 10 mm.  Given that the femoral component was overly distalized, the distal femoral cut appeared to be insufficient so we next turned our attention to removing this component.  This was done with 1/4 inch osteotome and easily removed with minimal bone loss.  The distal femur was then resected.  Following this resection, the tibia was   subluxated anteriorly.  Using the extramedullary guide, we measured for 10 mm resection off the proximal lateral tibia.  Following this resection there was a stable well cut surface of the lateral plateau however significant bone loss off the medial plateau especially posteriorly indicating that an augment would be necessary.  The tibia was sized to be approximately a size D.  A drop rod was utilized to confirm our varus valgus alignment and slope was appropriate.   Once this was done, the posterior femoral referencing femoral sizer was placed under to the posterior condyles.  Given the posterior medial bone loss from the removal of the femoral component care was taken to rotate the component to match the transepicondylar axis and be perpendicular with Whitesides line. The femur was sized to be a size 8 in the anterior-  posterior dimension. The   anterior, posterior, and  chamfer cuts were made without difficulty nor notching.  At this point we turned our attention to tibial preparation for stem and augment.  Given the patient's small size elected to prepare for a short 81mm cemented stem to provide diaphyseal fixation bypassing the fracture.  the size D tray was then pinned in position, appropriately externally rotated to the medial one third of the tibial tubercle, and lateralized without overhang.  The screw holes in the medial plateau fracture had to remove  at this point as it was in the way of canal preparation.  We then used the entry drill to identify the tibial canal and the canal was sequentially reamed up to 16 mm for the 14 mm stem allowing for 2 mm cement mantle.  With the tibial tray still pinned and placed we next position the augment cutting guide, and cut for a 10 mm medial Hemi augment.  This provided a good anterior and medial cortical surface for tray placement and stability however still notable defect was present posterior medial.  Given this defect felt that a cone would be indicated to help provide additional metaphyseal fixation.  Using a tilt reamer the canal was opened up and then the canal was centrally broached for a cone.  We had excellent metaphyseal purchase with the smallest size cone, the "fixed" cone.  With the cone trial in place we then again placed a 3.5 millimeter screw drilled medial to anterior lateral just anterior to the cone to again provide stability of the medial plateau fracture trial reduction was now carried with a 8 femur, D tibia, a 12 mm MC insert.  With this trial we had excellent varus valgus stability with approximately 1 to 2 mm gapping.  In extension and excellent flexion stability with less than 5 mm of anterior posterior shuck.  We did  note approximately a 5 degree persistent flexion contracture.  The implants were then removed and we turned our attention to removing posterior osteophytes with 1/2 inch curved osteotome as well as performing a posterior capsular release with a Cobb.  Trial components were again placed and felt at this point we had good extension.  Attention was next directed to the patella.  Precut  measurement was noted to be 23 mm.  I resected down to 14 mm and used a  75mm patellar button to restore patellar height as well as cover the cut surface.    The patella lug holes were drilled and a 29 mm patella poly trial was placed.  The knee was brought to full extension with good flexion  stability with the patella tracking through the trochlea with the use of 2 towel clips to reapproximate our arthrotomy.  Next the femoral component was again assessed and determined to be seated and appropriately lateralized.  The femoral lug holes were drilled.  A narrow femoral component was selected given the medial lateral width of the patient's femur.  The femoral component was then removed.Tibial component was again assessed and felt to be seated and appropriately rotated with the medial third of the tubercle.    The tourniquet which had been let down at a little bit over 2 hours, and had been down for over 20 minutes was reinflated for the cementing process.   Final components were opened and the tibia stem and augment were assembled to the tibial revision base tray.  A cement restrictor was placed distally approximately 1 cm distal to the end of the stem.  The cone was inserted into the tibia with the appropriate rotation and depth.  This had excellent purchase.  2 batches of antibiotic cement was mixed.  The knee was irrigated with normal saline solution and pulse lavage.  Cement was inserted into the canal onto the proximal tibia, the component which also had cement on it was inserted with the appropriate rotation and position.  The tibial component was held with compression until completely set.  Next an additional batch of antibiotic cement was prepared for the femur and patella.  The femur was inserted and a 12 mm trial polywas placed.  The final patellar component was also in cemented and.  This was held until the cement had fully set.  The knee was irrigated with dilute Betadine and saline.  As well as additional pulse lavage.  The synovium was injected with dilute Exparel and saline.  Once the cement had set.  The knee stability was again assessed and felt we had excellent range of motion 0 to 120 degrees and excellent stability in extension and flexion.  A final 12 mm MC polywas selected and  placed into the knee..  The MCL was felt to be intact with appropriate tightness.        The tourniquet was again let down.  No significant hemostasis was required.  The medial parapatellar arthrotomy was then reapproximated using #1 Stratafix sutures with the knee  in flexion.  The   remaining wound was closed with 0 stratafix, 2-0 Vicryl, and running 3-0 Monocryl.   The knee was cleaned, dried, dressed sterilely using Dermabond and Aquacel dressing.  The patient was then  brought to recovery room in stable condition, tolerating the procedure  well. There were no complications.   Post op recs: WB: WBAT Abx: ancef x23 hours post op, followed by cefadroxil 500 BID x7 days given increased risk  in revision setting Imaging: PACU xrays DVT prophylaxis: Aspirin 81mg  BID x4 weeks Follow up: 2 weeks after surgery for a wound check with Dr. at [facility].  Address: [address], [place], [place]  Office Phone: [phone]  [phone], MD Orthopaedic Surgery

## 2021-01-16 NOTE — Progress Notes (Signed)
Patient admitted to room. Alert and oriented x4. Oriented to room and staffs. Skin dry and warm no issue noted.

## 2021-01-16 NOTE — Transfer of Care (Signed)
Immediate Anesthesia Transfer of Care Note  Patient: Rhonda Stanley  Procedure(s) Performed: TOTAL KNEE ARTHROPLASTY WITH REVISION COMPONENTS (Right: Knee) OPEN REDUCTION INTERNAL FIXATION (ORIF) TIBIAL PLATEAU (Right)  Patient Location: PACU  Anesthesia Type:Spinal  Level of Consciousness: awake, alert  and patient cooperative  Airway & Oxygen Therapy: Patient Spontanous Breathing and Patient connected to face mask oxygen  Post-op Assessment: Report given to RN and Post -op Vital signs reviewed and stable  Post vital signs: Reviewed and stable  Last Vitals:  Vitals Value Taken Time  BP    Temp    Pulse    Resp    SpO2      Last Pain:  Vitals:   01/16/21 0933  TempSrc: Oral  PainSc:          Complications: No notable events documented.

## 2021-01-16 NOTE — Anesthesia Preprocedure Evaluation (Addendum)
Anesthesia Evaluation  Patient identified by MRN, date of birth, ID band Patient awake    Reviewed: Allergy & Precautions, H&P , NPO status , Patient's Chart, lab work & pertinent test results, reviewed documented beta blocker date and time   Airway Mallampati: II  TM Distance: >3 FB Neck ROM: Full    Dental no notable dental hx. (+) Teeth Intact, Dental Advisory Given   Pulmonary neg pulmonary ROS,    Pulmonary exam normal breath sounds clear to auscultation       Cardiovascular hypertension, Pt. on medications and Pt. on home beta blockers  Rhythm:Regular Rate:Normal     Neuro/Psych negative neurological ROS  negative psych ROS   GI/Hepatic negative GI ROS, Neg liver ROS,   Endo/Other  negative endocrine ROS  Renal/GU negative Renal ROS  negative genitourinary   Musculoskeletal  (+) Arthritis , Osteoarthritis,    Abdominal   Peds  Hematology negative hematology ROS (+)   Anesthesia Other Findings   Reproductive/Obstetrics negative OB ROS                            Anesthesia Physical Anesthesia Plan  ASA: 2  Anesthesia Plan: Spinal   Post-op Pain Management: Regional block and Tylenol PO (pre-op)   Induction: Intravenous  PONV Risk Score and Plan: 3 and Ondansetron, Dexamethasone, Propofol infusion and Midazolam  Airway Management Planned: Natural Airway and Simple Face Mask  Additional Equipment:   Intra-op Plan:   Post-operative Plan:   Informed Consent: I have reviewed the patients History and Physical, chart, labs and discussed the procedure including the risks, benefits and alternatives for the proposed anesthesia with the patient or authorized representative who has indicated his/her understanding and acceptance.     Dental advisory given  Plan Discussed with: CRNA  Anesthesia Plan Comments:        Anesthesia Quick Evaluation

## 2021-01-16 NOTE — Progress Notes (Signed)
Orthopedic Tech Progress Note Patient Details:  Rhonda Stanley 20-Jan-1966 [id number]  Patient ID: Rhonda Stanley, female   DOB: 1965-06-05, 55 y.o.   MRN: [id number]  Rhonda Stanley 01/16/2021, 4:50 PM Bone foam applied to right leg in pacu

## 2021-01-16 NOTE — Progress Notes (Signed)
Assisted Dr. Gaynelle Adu with  Right Knee Adductor Canal block. Side rails up, monitors on throughout procedure. See vital signs in flow sheet. Tolerated Procedure well.

## 2021-01-16 NOTE — Plan of Care (Signed)
?  Problem: Pain Management: ?Goal: Pain level will decrease with appropriate interventions ?Outcome: Progressing ?  ?Problem: Activity: ?Goal: Ability to avoid complications of mobility impairment will improve ?Outcome: Progressing ?Goal: Range of joint motion will improve ?Outcome: Progressing ?  ?

## 2021-01-16 NOTE — Anesthesia Procedure Notes (Addendum)
Spinal  Start time: 01/16/2021 11:52 AM End time: 01/16/2021 11:57 AM Reason for block: surgical anesthesia Staffing Performed: resident/CRNA  Anesthesiologist: Gaynelle Adu, MD Resident/CRNA: Lucinda Dell, CRNA Preanesthetic Checklist Completed: patient identified, IV checked, site marked, risks and benefits discussed, surgical consent, monitors and equipment checked and pre-op evaluation Spinal Block Patient position: sitting Prep: DuraPrep Patient monitoring: heart rate, cardiac monitor, continuous pulse ox and blood pressure Approach: midline Location: L3-4 Injection technique: single-shot Needle Needle type: Sprotte  Needle gauge: 24 G Needle length: 10 cm Needle insertion depth: 7 cm Assessment Sensory level: T6 Events: CSF return

## 2021-01-16 NOTE — Interval H&P Note (Signed)
The patient has been re-examined, and the chart reviewed, and there have been no interval changes to the documented history and physical.    The risks, benefits, and alternatives have been discussed at length with patient, and the patient is willing to proceed.  Right knee marked. Consent has been signed.

## 2021-01-16 NOTE — Anesthesia Postprocedure Evaluation (Signed)
Anesthesia Post Note  Patient: Rhonda Stanley  Procedure(s) Performed: TOTAL KNEE ARTHROPLASTY WITH REVISION COMPONENTS (Right: Knee) OPEN REDUCTION INTERNAL FIXATION (ORIF) TIBIAL PLATEAU (Right)     Patient location during evaluation: PACU Anesthesia Type: Spinal Level of consciousness: oriented and awake and alert Pain management: pain level controlled Vital Signs Assessment: post-procedure vital signs reviewed and stable Respiratory status: spontaneous breathing, respiratory function stable and nonlabored ventilation Cardiovascular status: blood pressure returned to baseline and stable Postop Assessment: no headache, no backache, no apparent nausea or vomiting, spinal receding and patient able to bend at knees Anesthetic complications: no   No notable events documented.  Last Vitals:  Vitals:   01/16/21 1700 01/16/21 1715  BP: 123/84 (!) 138/96  Pulse: 66 (!) 57  Resp: 16 16  Temp:  36.5 C  SpO2: 99% 100%    Last Pain:  Vitals:   01/16/21 1715  TempSrc:   PainSc: 3                  Nicie Milan A.

## 2021-01-17 LAB — CBC
HCT: 34.1 % — ABNORMAL LOW (ref 36.0–46.0)
Hemoglobin: 11 g/dL — ABNORMAL LOW (ref 12.0–15.0)
MCH: 26.3 pg (ref 26.0–34.0)
MCHC: 32.3 g/dL (ref 30.0–36.0)
MCV: 81.6 fL (ref 80.0–100.0)
Platelets: 306 10*3/uL (ref 150–400)
RBC: 4.18 MIL/uL (ref 3.87–5.11)
RDW: 14.7 % (ref 11.5–15.5)
WBC: 12.4 10*3/uL — ABNORMAL HIGH (ref 4.0–10.5)
nRBC: 0 % (ref 0.0–0.2)

## 2021-01-17 LAB — BASIC METABOLIC PANEL
Anion gap: 8 (ref 5–15)
BUN: 11 mg/dL (ref 6–20)
CO2: 25 mmol/L (ref 22–32)
Calcium: 9.1 mg/dL (ref 8.9–10.3)
Chloride: 102 mmol/L (ref 98–111)
Creatinine, Ser: 0.62 mg/dL (ref 0.44–1.00)
GFR, Estimated: >60 mL/min (ref >60)
Glucose, Bld: 141 mg/dL — ABNORMAL HIGH (ref 70–99)
Potassium: 3.8 mmol/L (ref 3.5–5.1)
Sodium: 135 mmol/L (ref 135–145)

## 2021-01-17 MED ORDER — CEFADROXIL 500 MG PO CAPS: 500.0000 mg | ORAL_CAPSULE | Freq: Two times a day (BID) | ORAL | 0 refills | Status: AC

## 2021-01-17 MED ORDER — OXYCODONE HCL 5 MG PO TABS: 5.0000 mg | ORAL_TABLET | ORAL | 0 refills | Status: AC | PRN

## 2021-01-17 MED ORDER — METHOCARBAMOL 500 MG PO TABS: 500.0000 mg | ORAL_TABLET | Freq: Three times a day (TID) | ORAL | 0 refills | Status: AC | PRN

## 2021-01-17 MED ORDER — ONDANSETRON HCL 4 MG PO TABS: 4.0000 mg | ORAL_TABLET | Freq: Three times a day (TID) | ORAL | 0 refills | Status: AC | PRN

## 2021-01-17 MED ORDER — ASPIRIN EC 81 MG PO TBEC: 81.0000 mg | DELAYED_RELEASE_TABLET | Freq: Two times a day (BID) | ORAL | 0 refills | Status: AC

## 2021-01-17 MED ORDER — ACETAMINOPHEN 500 MG PO TABS: 1000.0000 mg | ORAL_TABLET | Freq: Three times a day (TID) | ORAL | 0 refills | Status: AC | PRN

## 2021-01-17 NOTE — TOC Transition Note (Signed)
Transition of Care [facility]) - CM/SW Discharge Note  Patient Details  Name: TAHARA RUFFINI MRN: [id number] Date of Birth: [birth year]  [facility] CM/SW Contact:  Sherie Don, LCSW Phone Number: 01/17/2021, 10:03 AM  Clinical Narrative: Patient is expected to discharge home after working with PT. CSW met with patient to confirm discharge plan. Patient will discharge home with HHPT through [facility]. Patient has a toilet riser at home, but will need a rolling walker. Patient also requested a CPM as she used one after her previous surgery. CSW confirmed with orthopedist that CPM referral could be made to [facility] and the office will send the orders to [place]. CSW made CPM referral to [facility] with [facility]. TOC signing off.  Final next level of care: [place] Barriers to Discharge: No Barriers Identified  Patient Goals and CMS Choice Patient states their goals for this hospitalization and ongoing recovery are:: Discharge home with [place] CMS Medicare.gov Compare Post Acute Care list provided to:: Patient Choice offered to / list presented to : Patient  Discharge Plan and Services        DME Arranged: CPM, Walker rolling DME Agency: [employer] Date DME Agency Contacted: 01/17/21 Representative spoke with at DME Agency: Ovid Curd HH Arranged: PT [place]: [place] Representative spoke with at [place]: Prearranged in orthopedist's office  Readmission Risk Interventions No flowsheet data found.

## 2021-01-17 NOTE — Progress Notes (Signed)
Physical Therapy Treatment Patient Details Name: Rhonda Stanley MRN: [id number] DOB: 04-Feb-1966 Today's Date: 01/17/2021   History of Present Illness Pt s/p R TKR revision with noted tibial plateau fx.  Pt with hx of bil TKR    PT Comments    Pt continues very motivated and progressing well with mobility.  Pt up to ambulate in hall, negotiated stairs and reviewed written HEP program.  Pt eager for dc home this date.   Recommendations for follow up therapy are one component of a multi-disciplinary discharge planning process, led by the attending physician.  Recommendations may be updated based on patient status, additional functional criteria and insurance authorization.  Follow Up Recommendations  Follow physician's recommendations for discharge plan and follow up therapies     Assistance Recommended at Discharge Intermittent Supervision/Assistance  Equipment Recommendations  Rolling walker (2 wheels)    Recommendations for Other Services       Precautions / Restrictions Precautions Precautions: Fall;Knee Restrictions Weight Bearing Restrictions: No Other Position/Activity Restrictions: WBAT     Mobility  Bed Mobility Overal bed mobility: Needs Assistance Bed Mobility: Supine to Sit     Supine to sit: Min guard     General bed mobility comments: Pt up in chair and requests back to same    Transfers Overall transfer level: Needs assistance Equipment used: Rolling walker (2 wheels) Transfers: Sit to/from Stand Sit to Stand: Min guard           General transfer comment: Steady assist with cues for LE management and use of UEs to self assist    Ambulation/Gait Ambulation/Gait assistance: Min guard;Supervision Gait Distance (Feet): 100 Feet Assistive device: Rolling walker (2 wheels) Gait Pattern/deviations: Step-to pattern;Decreased step length - right;Decreased step length - left;Shuffle;Trunk flexed Gait velocity: decr     General Gait Details: cues  for sequence, posture and position from RW   Stairs Stairs: Yes Stairs assistance: Min assist Stair Management: Step to pattern;No rails;Forwards;With crutches Number of Stairs: 5 General stair comments: min cues for sequence and foot/crutch placement   Wheelchair Mobility    Modified Rankin (Stroke Patients Only)       Balance Overall balance assessment: Needs assistance Sitting-balance support: No upper extremity supported;Feet supported Sitting balance-Leahy Scale: Good     Standing balance support: No upper extremity supported Standing balance-Leahy Scale: Fair                              Cognition Arousal/Alertness: Awake/alert Behavior During Therapy: WFL for tasks assessed/performed Overall Cognitive Status: Within Functional Limits for tasks assessed                                          Exercises Total Joint Exercises Ankle Circles/Pumps: AROM;Both;15 reps;Supine Quad Sets: AROM;Both;10 reps;Supine Heel Slides: AAROM;Right;15 reps;Supine Hip ABduction/ADduction: AAROM;Right;10 reps;Supine Straight Leg Raises: AAROM;AROM;Right;10 reps;Supine    General Comments        Pertinent Vitals/Pain Pain Assessment: 0-10 Pain Score: 3  Pain Location: R knee Pain Descriptors / Indicators: Aching;Sore Pain Intervention(s): Limited activity within patient's tolerance;Monitored during session;Premedicated before session;Ice applied    Home Living Family/patient expects to be discharged to:: Private residence Living Arrangements: Parent Available Help at Discharge: Family Type of Home: House Home Access: Stairs to enter Entrance Stairs-Rails: None Entrance Stairs-Number of Steps: 2   Home Layout: One  level Home Equipment: Cane - single point;Crutches;BSC/3in1      Prior Function            PT Goals (current goals can now be found in the care plan section) Acute Rehab PT Goals Patient Stated Goal: Regain IND PT Goal  Formulation: With patient Time For Goal Achievement: 01/24/21 Potential to Achieve Goals: Good Progress towards PT goals: Progressing toward goals    Frequency    7X/week      PT Plan Current plan remains appropriate    Co-evaluation              AM-PAC PT "6 Clicks" Mobility   Outcome Measure  Help needed turning from your back to your side while in a flat bed without using bedrails?: A Little Help needed moving from lying on your back to sitting on the side of a flat bed without using bedrails?: A Little Help needed moving to and from a bed to a chair (including a wheelchair)?: A Little Help needed standing up from a chair using your arms (e.g., wheelchair or bedside chair)?: A Little Help needed to walk in hospital room?: A Little Help needed climbing 3-5 steps with a railing? : A Little 6 Click Score: 18    End of Session Equipment Utilized During Treatment: Gait belt Activity Tolerance: Patient tolerated treatment well Patient left: in chair;with call bell/phone within reach;with chair alarm set Nurse Communication: Mobility status PT Visit Diagnosis: Difficulty in walking, not elsewhere classified (R26.2)     Time: 2505-3976 PT Time Calculation (min) (ACUTE ONLY): 19 min  Charges:  $Gait Training: 8-22 mins $Therapeutic Exercise: 8-22 mins                     Mauro Kaufmann PT Acute Rehabilitation Services Pager [phone] Office [phone]    Rhonda Stanley 01/17/2021, 11:36 AM

## 2021-01-17 NOTE — Progress Notes (Signed)
° ° ° °  Subjective:  Patient reports pain as well controlled this morning.  Denies distal numbness and tingling.  Questions regarding surgery and postoperative plan were answered.  She is eager to get up with therapy today and hopeful to go home.  Objective:   VITALS:   Vitals:   01/16/21 1800 01/16/21 1843 01/16/21 2018 01/17/21 0513  BP: 128/77  (!) 142/86 (!) 147/82  Pulse: 69  63 (!) 57  Resp: 18  18 16   Temp: 98.5 F (36.9 C)  97.7 F (36.5 C) 98.6 F (37 C)  TempSrc: Oral  Oral Oral  SpO2: 96%  99% 95%  Weight:      Height:  5\' 3"  (1.6 m)      Sensation intact distally Intact pulses distally Dorsiflexion/Plantar flexion intact Incision: dressing C/D/I No cellulitis present Compartment soft   Lab Results  Component Value Date   WBC 12.4 (H) 01/17/2021   HGB 11.0 (L) 01/17/2021   HCT 34.1 (L) 01/17/2021   MCV 81.6 01/17/2021   PLT 306 01/17/2021   BMET    Component Value Date/Time   NA 135 01/17/2021 0320   NA 140 01/02/2021 1056   K 3.8 01/17/2021 0320   CL 102 01/17/2021 0320   CO2 25 01/17/2021 0320   GLUCOSE 141 (H) 01/17/2021 0320   BUN 11 01/17/2021 0320   BUN 18 01/02/2021 1056   CREATININE 0.62 01/17/2021 0320   CALCIUM 9.1 01/17/2021 0320   EGFR 100 01/02/2021 1056   GFRNONAA >60 01/17/2021 0320     Assessment/Plan: 1 Day Post-Op   Right knee conversion unilateral knee arthroplasty with aseptic loosening and medial plateau fracture to revision total knee arthroplasty  Post op recs: WB: WBAT Abx: ancef x23 hours post op, followed by cefadroxil 500 BID x7 days given increased risk in revision setting Imaging: PACU xrays DVT prophylaxis: Aspirin 81mg  BID x4 weeks Follow up: 2 weeks after surgery for a wound check with Dr. Zachery Dakins at [facility].  Address: [address] [place], [place], [place] [address]  Office Phone: [phone]    Willaim Sheng 01/17/2021, 7:44 AM   Charlies Constable, MD  Contact  information:   [phone] 7am-5pm epic message Dr. Zachery Dakins, or call office for patient follow up: [phone] After hours and holidays please check [email or web address] for group call information for [facility]

## 2021-01-17 NOTE — Evaluation (Signed)
Physical Therapy Evaluation Patient Details Name: Rhonda Stanley MRN: [id number] DOB: 1965-02-25 Today's Date: 01/17/2021  History of Present Illness  Pt s/p R TKR revision with noted tibial plateau fx.  Pt with hx of bil TKR  Clinical Impression  Pt s/p R TKR revision and presents with decreased R LE strength/ROM and post op pain limiting functional mobility.  Pt should progress to dc home with assist of family.     Recommendations for follow up therapy are one component of a multi-disciplinary discharge planning process, led by the attending physician.  Recommendations may be updated based on patient status, additional functional criteria and insurance authorization.  Follow Up Recommendations Follow physician's recommendations for discharge plan and follow up therapies    Assistance Recommended at Discharge Intermittent Supervision/Assistance  Functional Status Assessment Patient has had a recent decline in their functional status and demonstrates the ability to make significant improvements in function in a reasonable and predictable amount of time.  Equipment Recommendations  Rolling walker (2 wheels)    Recommendations for Other Services       Precautions / Restrictions Precautions Precautions: Fall;Knee Restrictions Weight Bearing Restrictions: No Other Position/Activity Restrictions: WBAT      Mobility  Bed Mobility Overal bed mobility: Needs Assistance Bed Mobility: Supine to Sit     Supine to sit: Min guard     General bed mobility comments: min guard for R LE    Transfers Overall transfer level: Needs assistance Equipment used: Rolling walker (2 wheels) Transfers: Sit to/from Stand Sit to Stand: Min guard           General transfer comment: Steady assist with cues for LE management and use of UEs to self assist    Ambulation/Gait Ambulation/Gait assistance: Min assist;Min guard Gait Distance (Feet): 130 Feet Assistive device: Rolling walker (2  wheels) Gait Pattern/deviations: Step-to pattern;Decreased step length - right;Decreased step length - left;Shuffle;Trunk flexed Gait velocity: decr     General Gait Details: cues for sequence, posture and position from [employer]            Wheelchair Mobility    Modified Rankin (Stroke Patients Only)       Balance Overall balance assessment: Needs assistance Sitting-balance support: No upper extremity supported;Feet supported Sitting balance-Leahy Scale: Good     Standing balance support: Bilateral upper extremity supported Standing balance-Leahy Scale: Poor                               Pertinent Vitals/Pain Pain Assessment: 0-10 Pain Score: 3  Pain Location: R knee Pain Descriptors / Indicators: Aching;Sore Pain Intervention(s): Limited activity within patient's tolerance;Monitored during session;Premedicated before session;Ice applied    Home Living Family/patient expects to be discharged to:: Private residence Living Arrangements: Parent Available Help at Discharge: Family Type of Home: House Home Access: Stairs to enter Entrance Stairs-Rails: None Secretary/administrator of Steps: 2   Home Layout: One level Home Equipment: Cane - single point;Crutches;BSC/3in1      Prior Function Prior Level of Function : Independent/Modified Independent             Mobility Comments: using crutches since tib plateau fx       Hand Dominance        Extremity/Trunk Assessment   Upper Extremity Assessment Upper Extremity Assessment: Overall WFL for tasks assessed    Lower Extremity Assessment Lower Extremity Assessment: RLE deficits/detail RLE Deficits / Details: IND SLR with AAROM  at knee -5 - 65    Cervical / Trunk Assessment Cervical / Trunk Assessment: Normal  Communication   Communication: No difficulties  Cognition Arousal/Alertness: Awake/alert Behavior During Therapy: WFL for tasks assessed/performed Overall Cognitive Status:  Within Functional Limits for tasks assessed                                          General Comments      Exercises Total Joint Exercises Ankle Circles/Pumps: AROM;Both;15 reps;Supine Quad Sets: AROM;Both;10 reps;Supine Heel Slides: AAROM;Right;15 reps;Supine Hip ABduction/ADduction: AAROM;Right;10 reps;Supine Straight Leg Raises: AAROM;AROM;Right;10 reps;Supine   Assessment/Plan    PT Assessment Patient needs continued PT services  PT Problem List Decreased strength;Decreased range of motion;Decreased activity tolerance;Decreased balance;Decreased mobility;Decreased knowledge of use of DME;Pain       PT Treatment Interventions DME instruction;Gait training;Stair training;Functional mobility training;Therapeutic activities;Therapeutic exercise;Patient/family education    PT Goals (Current goals can be found in the Care Plan section)  Acute Rehab PT Goals Patient Stated Goal: Regain IND PT Goal Formulation: With patient Time For Goal Achievement: 01/24/21 Potential to Achieve Goals: Good    Frequency 7X/week   Barriers to discharge        Co-evaluation               AM-PAC PT "6 Clicks" Mobility  Outcome Measure Help needed turning from your back to your side while in a flat bed without using bedrails?: A Little Help needed moving from lying on your back to sitting on the side of a flat bed without using bedrails?: A Little Help needed moving to and from a bed to a chair (including a wheelchair)?: A Little Help needed standing up from a chair using your arms (e.g., wheelchair or bedside chair)?: A Little Help needed to walk in hospital room?: A Little Help needed climbing 3-5 steps with a railing? : A Little 6 Click Score: 18    End of Session Equipment Utilized During Treatment: Gait belt Activity Tolerance: Patient tolerated treatment well Patient left: in chair;with call bell/phone within reach;with chair alarm set Nurse Communication:  Mobility status PT Visit Diagnosis: Difficulty in walking, not elsewhere classified (R26.2)    Time: 8144-8185 PT Time Calculation (min) (ACUTE ONLY): 30 min   Charges:   PT Evaluation $PT Eval Low Complexity: 1 Low PT Treatments $Therapeutic Exercise: 8-22 mins        Mauro Kaufmann PT Acute Rehabilitation Services Pager [phone] Office [phone]   Rhonda Stanley 01/17/2021, 11:25 AM

## 2021-01-17 NOTE — Discharge Instructions (Signed)

## 2021-01-18 ENCOUNTER — Other Ambulatory Visit (HOSPITAL_COMMUNITY): Payer: Self-pay

## 2021-01-18 NOTE — Discharge Summary (Signed)
Physician Discharge Summary  Patient ID: Rhonda Stanley MRN: [id number] DOB/AGE: 53/22/1967 53 y.o.  Admit date: 01/16/2021 Discharge date: 01/18/2021  Admission Diagnoses:  Failed total knee arthroplasty [facility])  Discharge Diagnoses:  Principal Problem:   Failed total knee arthroplasty [facility])   Past Medical History:  Diagnosis Date   Hypertension     Surgeries: Procedure(s): TOTAL KNEE ARTHROPLASTY WITH REVISION COMPONENTS OPEN REDUCTION INTERNAL FIXATION (ORIF) TIBIAL PLATEAU on 01/16/2021   Consultants (if any):   Discharged Condition: Improved  Hospital Course: Rhonda Stanley is an 55 y.o. female who was admitted 01/16/2021 with a diagnosis of Failed total knee arthroplasty (HCC) and went to the operating room on 01/16/2021 and underwent the above named procedures.   She was discharge home on 01/17/21  She was given perioperative antibiotics:  Anti-infectives (From admission, onward)    Start     Dose/Rate Route Frequency Ordered Stop   01/17/21 0000  ceFAZolin (ANCEF) IVPB 2g/100 mL premix        2 g 200 mL/hr over 30 Minutes Intravenous Every 8 hours 01/16/21 1757 01/17/21 0858   01/17/21 0000  cefadroxil (DURICEF) 500 MG capsule        500 mg Oral 2 times daily 01/17/21 0749 01/24/21 2359   01/16/21 0900  ceFAZolin (ANCEF) IVPB 2g/100 mL premix        2 g 200 mL/hr over 30 Minutes Intravenous On call to O.R. 01/16/21 6967 01/16/21 1604     .  She was given sequential compression devices, early ambulation, and aspirin for DVT prophylaxis.  She benefited maximally from the hospital stay and there were no complications.    Recent vital signs:  Vitals:   01/17/21 0513 01/17/21 0925  BP: (!) 147/82 122/75  Pulse: (!) 57 (!) 57  Resp: 16 18  Temp: 98.6 F (37 C)   SpO2: 95% 97%    Recent laboratory studies:  Lab Results  Component Value Date   HGB 11.0 (L) 01/17/2021   HGB 13.6 01/02/2021   HGB 13.1 03/05/2020   Lab Results  Component Value Date    WBC 12.4 (H) 01/17/2021   PLT 306 01/17/2021   No results found for: INR Lab Results  Component Value Date   NA 135 01/17/2021   K 3.8 01/17/2021   CL 102 01/17/2021   CO2 25 01/17/2021   BUN 11 01/17/2021   CREATININE 0.62 01/17/2021   GLUCOSE 141 (H) 01/17/2021    Discharge Medications:   Allergies as of 01/17/2021   No Known Allergies      Medication List     TAKE these medications    acetaminophen 500 MG tablet Commonly known as: TYLENOL Take 2 tablets (1,000 mg total) by mouth every 8 (eight) hours as needed.   amLODipine 10 MG tablet Commonly known as: NORVASC Take 1 tablet (10 mg total) by mouth daily.   aspirin EC 81 MG tablet Take 1 tablet (81 mg total) by mouth 2 (two) times daily for 28 days. Swallow whole.   atenolol 50 MG tablet Commonly known as: TENORMIN Take 1 tablet (50 mg total) by mouth daily.   atorvastatin 20 MG tablet Commonly known as: LIPITOR TAKE 1 TABLET BY MOUTH DAILY. TAKE 1 TABLET BY MOUTH EVERY DAY.   cefadroxil 500 MG capsule Commonly known as: DURICEF Take 1 capsule (500 mg total) by mouth 2 (two) times daily for 7 days.   hydrALAZINE 25 MG tablet Commonly known as: APRESOLINE TAKE 1 TABLET BY MOUTH  THREE TIMES A DAY   hydrochlorothiazide 12.5 MG capsule Commonly known as: MICROZIDE Take 1 capsule (12.5 mg total) by mouth daily.   irbesartan-hydrochlorothiazide 300-12.5 MG tablet Commonly known as: AVALIDE Take 1 tablet by mouth daily.   meloxicam 15 MG tablet Commonly known as: MOBIC Take 15 mg by mouth daily as needed (inflammation).   methocarbamol 500 MG tablet Commonly known as: Robaxin Take 1 tablet (500 mg total) by mouth every 8 (eight) hours as needed for up to 10 days for muscle spasms.   ondansetron 4 MG tablet Commonly known as: Zofran Take 1 tablet (4 mg total) by mouth every 8 (eight) hours as needed for up to 14 days for nausea or vomiting.   oxyCODONE 5 MG immediate release tablet Commonly  known as: Roxicodone Take 1 tablet (5 mg total) by mouth every 4 (four) hours as needed for up to 7 days for severe pain or moderate pain.   paragard intrauterine copper Iud IUD 1 each by Intrauterine route once.   Vitamin D (Ergocalciferol) 1.25 MG (50000 UNIT) Caps capsule Commonly known as: DRISDOL Take 1 capsule (50,000 Units total) by mouth every 7 (seven) days.        Diagnostic Studies: DG Knee 1-2 Views Right  Result Date: 01/16/2021 CLINICAL DATA:  Knee revision EXAM: RIGHT KNEE - 1-2 VIEW COMPARISON:  07/14/2019, CT 12/07/2020 the images demonstrate total left knee replacement FINDINGS: Seven low resolution intraoperative spot views of the right knee. Total fluoroscopy time was 9 seconds. The images demonstrate total knee replacement with normal alignment. Fixating screw within the medial metaphysis of the tibia at the site of CT demonstrated fracture. There is anatomic alignment. IMPRESSION: Intraoperative fluoroscopic assistance provided during knee replacement surgery and screw fixation of tibial medial metaphyseal fracture. Electronically Signed   By: Jasmine Pang M.D.   On: 01/16/2021 16:26   DG Knee Right Port  Result Date: 01/16/2021 CLINICAL DATA:  Right total knee arthroplasty EXAM: PORTABLE RIGHT KNEE - 1-2 VIEW COMPARISON:  Intraoperative fluoroscopic radiographs dated 01/16/2021 FINDINGS: Right knee arthroplasty in satisfactory position. A screw transfixes the proximal aspect of the medial tibia. Associated soft tissue swelling and soft tissue gas. No suprapatellar knee joint effusion. IMPRESSION: Right knee arthroplasty, without evidence of complication. Electronically Signed   By: Charline Bills M.D.   On: 01/16/2021 19:15   DG C-Arm 1-60 Min-No Report  Result Date: 01/16/2021 Fluoroscopy was utilized by the requesting physician.  No radiographic interpretation.   DG C-Arm 1-60 Min-No Report  Result Date: 01/16/2021 Fluoroscopy was utilized by the  requesting physician.  No radiographic interpretation.   DG C-Arm 1-60 Min-No Report  Result Date: 01/16/2021 Fluoroscopy was utilized by the requesting physician.  No radiographic interpretation.    Disposition: Discharge disposition: 06-Home-Health Care Svc          Follow-up Information     [facility] Follow up.   Specialty: Home Health Services Why: PT Contact information: [address] [place] [address] [phone]                  Signed: Clarisse Gouge Caiden Arteaga 01/18/2021, 7:52 AM

## 2021-01-22 ENCOUNTER — Encounter (HOSPITAL_COMMUNITY): Payer: Self-pay | Admitting: Orthopedic Surgery

## 2021-02-06 ENCOUNTER — Other Ambulatory Visit: Payer: Self-pay | Admitting: Nurse Practitioner

## 2021-02-07 NOTE — Telephone Encounter (Signed)
Requested Prescriptions  Pending Prescriptions Disp Refills   hydrALAZINE (APRESOLINE) 25 MG tablet [Pharmacy Med Name: HYDRALAZINE 25 MG TABLET] 90 tablet 3    Sig: TAKE 1 TABLET BY MOUTH THREE TIMES A DAY     Cardiovascular:  Vasodilators Failed - 02/06/2021  1:32 PM      Failed - HCT in normal range and within 360 days    HCT  Date Value Ref Range Status  01/17/2021 34.1 (L) 36.0 - 46.0 % Final   Hematocrit  Date Value Ref Range Status  01/02/2021 43.6 34.0 - 46.6 % Final         Failed - HGB in normal range and within 360 days    Hemoglobin  Date Value Ref Range Status  01/17/2021 11.0 (L) 12.0 - 15.0 g/dL Final  01/02/2021 13.6 11.1 - 15.9 g/dL Final         Failed - WBC in normal range and within 360 days    WBC  Date Value Ref Range Status  01/17/2021 12.4 (H) 4.0 - 10.5 K/uL Final         Passed - RBC in normal range and within 360 days    RBC  Date Value Ref Range Status  01/17/2021 4.18 3.87 - 5.11 MIL/uL Final         Passed - PLT in normal range and within 360 days    Platelets  Date Value Ref Range Status  01/17/2021 306 150 - 400 K/uL Final  01/02/2021 382 150 - 450 x10E3/uL Final         Passed - Last BP in normal range    BP Readings from Last 1 Encounters:  01/17/21 122/75         Passed - Valid encounter within last 12 months    Recent Outpatient Visits          1 month ago Pre-op exam   [place], NP   1 month ago Essential hypertension, benign   [facility] [name], NP   2 months ago Hypercholesterolemia   [facility] [name], NP   11 months ago Annual physical exam    [facility] [name], NP   1 year ago Essential hypertension, benign   [place], Lilia Argue, [place]      Future Appointments            In 1 month [name], NP [facility], [place]

## 2021-04-03 ENCOUNTER — Ambulatory Visit: Payer: Self-pay | Admitting: Nurse Practitioner

## 2021-06-05 ENCOUNTER — Other Ambulatory Visit: Payer: Self-pay | Admitting: Nurse Practitioner

## 2021-06-05 DIAGNOSIS — E7800 Pure hypercholesterolemia, unspecified: Secondary | ICD-10-CM

## 2021-06-05 DIAGNOSIS — I10 Essential (primary) hypertension: Secondary | ICD-10-CM

## 2021-06-05 NOTE — Telephone Encounter (Signed)
Requested Prescriptions  ?Pending Prescriptions Disp Refills  ?? irbesartan-hydrochlorothiazide (AVALIDE) 300-12.5 MG tablet [Pharmacy Med Name: IRBESARTAN-HCTZ 300-12.5 MG TB] 90 tablet 1  ?  Sig: TAKE 1 TABLET BY MOUTH EVERY DAY  ?  ? Cardiovascular: ARB + Diuretic Combos Passed - 06/05/2021  2:27 AM  ?  ?  Passed - K in normal range and within 180 days  ?  Potassium  ?Date Value Ref Range Status  ?01/17/2021 3.8 3.5 - 5.1 mmol/L Final  ?   ?  ?  Passed - Na in normal range and within 180 days  ?  Sodium  ?Date Value Ref Range Status  ?01/17/2021 135 135 - 145 mmol/L Final  ?01/02/2021 140 134 - 144 mmol/L Final  ?   ?  ?  Passed - Cr in normal range and within 180 days  ?  Creatinine, Ser  ?Date Value Ref Range Status  ?01/17/2021 0.62 0.44 - 1.00 mg/dL Final  ?   ?  ?  Passed - eGFR is 10 or above and within 180 days  ?  GFR calc Af Amer  ?Date Value Ref Range Status  ?03/05/2020 117 >59 mL/min/1.73 Final  ?  Comment:  ?  **In accordance with recommendations from the NKF-ASN Task force,** ?  [facility] is in the process of updating its eGFR calculation to the ?  2021 CKD-EPI creatinine equation that estimates kidney function ?  without a race variable. ?  ? ?GFR, Estimated  ?Date Value Ref Range Status  ?01/17/2021 >60 >60 mL/min Final  ?  Comment:  ?  (NOTE) ?Calculated using the CKD-EPI Creatinine Equation (2021) ?  ? ?eGFR  ?Date Value Ref Range Status  ?01/02/2021 100 >59 mL/min/1.73 Final  ?   ?  ?  Passed - Patient is not pregnant  ?  ?  Passed - Last BP in normal range  ?  BP Readings from Last 1 Encounters:  ?01/17/21 122/75  ?   ?  ?  Passed - Valid encounter within last 6 months  ?  Recent Outpatient Visits   ?      ? 5 months ago Pre-op exam  ? [place], NP  ? 5 months ago Essential hypertension, benign  ? [place], NP  ? 6 months ago Hypercholesterolemia  ? [place], NP  ? 1 year ago Annual physical exam   ? [place], NP  ? 1 year ago Essential hypertension, benign  ? [place], [place], [place]  ?  ?  ? ?  ?  ?  ?? amLODipine (NORVASC) 10 MG tablet [Pharmacy Med Name: AMLODIPINE BESYLATE 10 MG TAB] 90 tablet 1  ?  Sig: TAKE 1 TABLET BY MOUTH EVERY DAY  ?  ? Cardiovascular: Calcium Channel Blockers 2 Passed - 06/05/2021  2:27 AM  ?  ?  Passed - Last BP in normal range  ?  BP Readings from Last 1 Encounters:  ?01/17/21 122/75  ?   ?  ?  Passed - Last Heart Rate in normal range  ?  Pulse Readings from Last 1 Encounters:  ?01/17/21 (!) 57  ?   ?  ?  Passed - Valid encounter within last 6 months  ?  Recent Outpatient Visits   ?      ? 5 months ago Pre-op exam  ? [place], NP  ? 5 months ago Essential  hypertension, benign  ? [place], NP  ? 6 months ago Hypercholesterolemia  ? [place], NP  ? 1 year ago Annual physical exam  ? [place], NP  ? 1 year ago Essential hypertension, benign  ? [place], [place], [place]  ?  ?  ? ?  ?  ?  ?? atenolol (TENORMIN) 50 MG tablet [Pharmacy Med Name: ATENOLOL 50 MG TABLET] 90 tablet 1  ?  Sig: TAKE 1 TABLET BY MOUTH EVERY DAY  ?  ? Cardiovascular: Beta Blockers 2 Passed - 06/05/2021  2:27 AM  ?  ?  Passed - Cr in normal range and within 360 days  ?  Creatinine, Ser  ?Date Value Ref Range Status  ?01/17/2021 0.62 0.44 - 1.00 mg/dL Final  ?   ?  ?  Passed - Last BP in normal range  ?  BP Readings from Last 1 Encounters:  ?01/17/21 122/75  ?   ?  ?  Passed - Last Heart Rate in normal range  ?  Pulse Readings from Last 1 Encounters:  ?01/17/21 (!) 57  ?   ?  ?  Passed - Valid encounter within last 6 months  ?  Recent Outpatient Visits   ?      ? 5 months ago Pre-op exam  ? [place], NP  ? 5 months ago Essential hypertension, benign  ?  [place], NP  ? 6 months ago Hypercholesterolemia  ? [place], NP  ? 1 year ago Annual physical exam  ? [place], NP  ? 1 year ago Essential hypertension, benign  ? [place], [place], [place]  ?  ?  ? ?  ?  ?  ?? atorvastatin (LIPITOR) 20 MG tablet [Pharmacy Med Name: ATORVASTATIN 20 MG TABLET] 90 tablet 1  ?  Sig: TAKE 1 TABLET BY MOUTH EVERY DAY  ?  ? Cardiovascular:  Antilipid - Statins Failed - 06/05/2021  2:27 AM  ?  ?  Failed - Lipid Panel in normal range within the last 12 months  ?  Cholesterol, Total  ?Date Value Ref Range Status  ?01/02/2021 210 (H) 100 - 199 mg/dL Final  ? ?Cholesterol [place], [place]  ?Date Value Ref Range Status  ?10/13/2014 170 <200 mg/dL Final  ?  Comment:  ?                          Desirable                <200 ?                        Borderline High      200- 239 ?                        High                     >239 ?  ? ?LDL Chol Calc (NIH)  ?Date Value Ref Range Status  ?01/02/2021 140 (H) 0 - 99 mg/dL Final  ? ?HDL  ?Date Value Ref Range Status  ?01/02/2021 54 >39 mg/dL Final  ? ?Triglycerides  ?Date Value Ref Range Status  ?01/02/2021 91 0 - 149 mg/dL Final  ? ?Triglycerides  Piccolo,Waived  ?Date Value Ref Range Status  ?10/13/2014 81 <150 mg/dL Final  ?  Comment:  ?                          Normal                   <150 ?                        Borderline High     150 - 199 ?                        High                200 - 499 ?                        Very High                >499 ?  ? ?  ?  ?  Passed - Patient is not pregnant  ?  ?  Passed - Valid encounter within last 12 months  ?  Recent Outpatient Visits   ?      ? 5 months ago Pre-op exam  ? [place], NP  ? 5 months ago Essential hypertension, benign  ? [place], NP  ? 6 months ago Hypercholesterolemia  ? [place], NP  ? 1 year ago Annual physical exam  ? [place], NP  ? 1 year ago Essential hypertension, benign  ? [place], [place], [place]  ?  ?  ? ?  ?  ?  ?? hydrochlorothiazide (MICROZIDE) 12.5 MG capsule [Pharmacy Med Name: HYDROCHLOROTHIAZIDE 12.5 MG CP] 90 capsule 1  ?  Sig: TAKE 1 CAPSULE BY MOUTH EVERY DAY  ?  ? Cardiovascular: Diuretics - Thiazide Passed - 06/05/2021  2:27 AM  ?  ?  Passed - Cr in normal range and within 180 days  ?  Creatinine, Ser  ?Date Value Ref Range Status  ?01/17/2021 0.62 0.44 - 1.00 mg/dL Final  ?   ?  ?  Passed - K in normal range and within 180 days  ?  Potassium  ?Date Value Ref Range Status  ?01/17/2021 3.8 3.5 - 5.1 mmol/L Final  ?   ?  ?  Passed - Na in normal range and within 180 days  ?  Sodium  ?Date Value Ref Range Status  ?01/17/2021 135 135 - 145 mmol/L Final  ?01/02/2021 140 134 - 144 mmol/L Final  ?   ?  ?  Passed - Last BP in normal range  ?  BP Readings from Last 1 Encounters:  ?01/17/21 122/75  ?   ?  ?  Passed - Valid encounter within last 6 months  ?  Recent Outpatient Visits   ?      ? 5 months ago Pre-op exam  ? [employer], NP  ? 5 months ago Essential hypertension, benign  ? [place], NP  ? 6 months ago Hypercholesterolemia  ? [place], NP  ? 1 year ago Annual physical exam  ? [place], NP  ? 1 year ago Essential hypertension, benign  ? [place],  Lilia Argue, PA-C  ?  ?  ? ?  ?  ?  ? ?

## 2021-06-22 ENCOUNTER — Other Ambulatory Visit: Payer: Self-pay | Admitting: Nurse Practitioner

## 2021-07-18 ENCOUNTER — Other Ambulatory Visit: Payer: Self-pay | Admitting: Nurse Practitioner

## 2021-07-18 NOTE — Telephone Encounter (Signed)
Medication Refill - Medication: hydrALAZINE (APRESOLINE) 25 MG tablet  Has the patient contacted their pharmacy? No. (Agent: If no, request that the patient contact the pharmacy for the refill. If patient does not wish to contact the pharmacy document the reason why and proceed with request.)   Preferred Pharmacy (with phone number or street name):  [id number] [place] [place] - [address]  [address] [place] [address]  Phone: [phone] Fax: [phone]  Hours: Not open 24 hours   Has the patient been seen for an appointment in the last year OR does the patient have an upcoming appointment? Yes.    Agent: Please be advised that RX refills may take up to 3 business days. We ask that you follow-up with your pharmacy.

## 2021-07-18 NOTE — Telephone Encounter (Signed)
Requested medication (s) are due for refill today: yes  Requested medication (s) are on the active medication list: yes  Last refill:  02/07/21 #90 3 refills  Future visit scheduled: no  Notes to clinic:  protocol failed last labs 01/17/21 . Do you want to refill Rx?     Requested Prescriptions  Pending Prescriptions Disp Refills   hydrALAZINE (APRESOLINE) 25 MG tablet 90 tablet 3    Sig: Take 1 tablet (25 mg total) by mouth 3 (three) times daily.     Cardiovascular:  Vasodilators Failed - 07/18/2021  3:53 PM      Failed - HCT in normal range and within 360 days    HCT  Date Value Ref Range Status  01/17/2021 34.1 (L) 36.0 - 46.0 % Final   Hematocrit  Date Value Ref Range Status  01/02/2021 43.6 34.0 - 46.6 % Final         Failed - HGB in normal range and within 360 days    Hemoglobin  Date Value Ref Range Status  01/17/2021 11.0 (L) 12.0 - 15.0 g/dL Final  35/32/9924 26.8 11.1 - 15.9 g/dL Final         Failed - WBC in normal range and within 360 days    WBC  Date Value Ref Range Status  01/17/2021 12.4 (H) 4.0 - 10.5 K/uL Final         Failed - ANA Screen, Ifa, Serum in normal range and within 360 days    No results found for: "ANA", "ANATITER", "LABANTI"       Passed - RBC in normal range and within 360 days    RBC  Date Value Ref Range Status  01/17/2021 4.18 3.87 - 5.11 MIL/uL Final         Passed - PLT in normal range and within 360 days    Platelets  Date Value Ref Range Status  01/17/2021 306 150 - 400 K/uL Final  01/02/2021 382 150 - 450 x10E3/uL Final         Passed - Last BP in normal range    BP Readings from Last 1 Encounters:  01/17/21 122/75         Passed - Valid encounter within last 12 months    Recent Outpatient Visits           6 months ago Pre-op exam   [facility] Larae Grooms, NP   7 months ago Essential hypertension, benign   [facility] Larae Grooms, NP   7 months ago Hypercholesterolemia    [facility] Larae Grooms, NP   1 year ago Annual physical exam   [facility] Larae Grooms, NP   2 years ago Essential hypertension, benign   [facility] [place], [place], [place]

## 2021-07-19 NOTE — Telephone Encounter (Signed)
Needs appt

## 2021-07-22 NOTE — Telephone Encounter (Signed)
Patient states she is unable to schedule a virtual appointment because she has to work. States she will "figure something out"

## 2021-07-22 NOTE — Telephone Encounter (Signed)
Patient states she is out of town for work during the week and only home on weekends. Is there anyway she could do a virtual visit if she checks her blood pressure at home?

## 2021-07-22 NOTE — Telephone Encounter (Signed)
Okay I will call patient to set up an appointment

## 2021-07-22 NOTE — Telephone Encounter (Signed)
Yes. She'll likely need blood work but we can work on that.

## 2021-08-12 ENCOUNTER — Other Ambulatory Visit: Payer: Self-pay | Admitting: Nurse Practitioner

## 2021-08-12 MED ORDER — HYDRALAZINE HCL 25 MG PO TABS: 25.0000 mg | ORAL_TABLET | Freq: Three times a day (TID) | ORAL | 2 refills | Status: DC

## 2021-08-12 NOTE — Telephone Encounter (Signed)
Copied from CRM [id number]. Topic: General - Other >> Aug 12, 2021  3:20 PM Everette C wrote: Reason for CRM: Medication Refill - Medication: hydrALAZINE (APRESOLINE) 25 MG tablet [[id number]]   Has the patient contacted their pharmacy? No. The patient elected to contact their PCP first  (Agent: If no, request that the patient contact the pharmacy for the refill. If patient does not wish to contact the pharmacy document the reason why and proceed with request.) (Agent: If yes, when and what did the pharmacy advise?)  Preferred Pharmacy (with phone number or street name): [id number] [place], [place] - [address] [place] [address] Phone: [phone] Fax: [phone] Hours: Not open 24 hours   Has the patient been seen for an appointment in the last year OR does the patient have an upcoming appointment? No.  Agent: Please be advised that RX refills may take up to 3 business days. We ask that you follow-up with your pharmacy.

## 2021-08-12 NOTE — Telephone Encounter (Signed)
Requested Prescriptions  Pending Prescriptions Disp Refills  . hydrALAZINE (APRESOLINE) 25 MG tablet 90 tablet 3    Sig: Take 1 tablet (25 mg total) by mouth 3 (three) times daily.     Cardiovascular:  Vasodilators Failed - 08/12/2021  4:22 PM      Failed - HCT in normal range and within 360 days    HCT  Date Value Ref Range Status  01/17/2021 34.1 (L) 36.0 - 46.0 % Final   Hematocrit  Date Value Ref Range Status  01/02/2021 43.6 34.0 - 46.6 % Final         Failed - HGB in normal range and within 360 days    Hemoglobin  Date Value Ref Range Status  01/17/2021 11.0 (L) 12.0 - 15.0 g/dL Final  82/95/6213 08.6 11.1 - 15.9 g/dL Final         Failed - WBC in normal range and within 360 days    WBC  Date Value Ref Range Status  01/17/2021 12.4 (H) 4.0 - 10.5 K/uL Final         Failed - ANA Screen, Ifa, Serum in normal range and within 360 days    No results found for: "ANA", "ANATITER", "LABANTI"       Passed - RBC in normal range and within 360 days    RBC  Date Value Ref Range Status  01/17/2021 4.18 3.87 - 5.11 MIL/uL Final         Passed - PLT in normal range and within 360 days    Platelets  Date Value Ref Range Status  01/17/2021 306 150 - 400 K/uL Final  01/02/2021 382 150 - 450 x10E3/uL Final         Passed - Last BP in normal range    BP Readings from Last 1 Encounters:  01/17/21 122/75         Passed - Valid encounter within last 12 months    Recent Outpatient Visits          7 months ago Pre-op exam   [facility] Larae Grooms, NP   7 months ago Essential hypertension, benign   [facility] Larae Grooms, NP   8 months ago Hypercholesterolemia   [facility] Larae Grooms, NP   1 year ago Annual physical exam   [facility] Larae Grooms, NP   2 years ago Essential hypertension, benign   [facility] [place], [place], [place]      Future Appointments            In 2  weeks Larae Grooms, NP [facility] [place], [facility]

## 2021-08-12 NOTE — Addendum Note (Signed)
Addended by: Ross Ludwig on: 08/12/2021 04:22 PM   Modules accepted: Orders

## 2021-08-12 NOTE — Telephone Encounter (Signed)
Pt called back, agent called to speak with NT about refill. Advised him that since pt taking medication TID it wasn't a year supply. Advised him to schedule pt for fu appt and would refill medication for 90 day supply. Pt scheduled appt for 08/28/21 with PCP.

## 2021-08-12 NOTE — Telephone Encounter (Signed)
Patient called, left VM to return the call to the office re: refill. She has refills of 1 year of [facility], [facility]

## 2021-08-13 ENCOUNTER — Other Ambulatory Visit: Payer: Self-pay | Admitting: Nurse Practitioner

## 2021-08-13 DIAGNOSIS — I10 Essential (primary) hypertension: Secondary | ICD-10-CM

## 2021-08-14 NOTE — Telephone Encounter (Signed)
Enough refills given on 06/05/21 to last 3 months, upcoming appointment on 08/28/21, patient needs an appointment before additional refills, will refuse this request.    Requested Prescriptions  Pending Prescriptions Disp Refills  . hydrochlorothiazide (MICROZIDE) 12.5 MG capsule [Pharmacy Med Name: HYDROCHLOROTHIAZIDE 12.5 MG CP] 90 capsule 0    Sig: TAKE 1 CAPSULE BY MOUTH EVERY DAY     Cardiovascular: Diuretics - Thiazide Failed - 08/13/2021 11:34 AM      Failed - Cr in normal range and within 180 days    Creatinine, Ser  Date Value Ref Range Status  01/17/2021 0.62 0.44 - 1.00 mg/dL Final         Failed - K in normal range and within 180 days    Potassium  Date Value Ref Range Status  01/17/2021 3.8 3.5 - 5.1 mmol/L Final         Failed - Na in normal range and within 180 days    Sodium  Date Value Ref Range Status  01/17/2021 135 135 - 145 mmol/L Final  01/02/2021 140 134 - 144 mmol/L Final         Failed - Valid encounter within last 6 months    Recent Outpatient Visits          7 months ago Pre-op exam   [place], NP   7 months ago Essential hypertension, benign   [place], NP   8 months ago Hypercholesterolemia   [facility] [name], NP   1 year ago Annual physical exam   [place], NP   2 years ago Essential hypertension, benign   [place], Lilia Argue, [place]      Future Appointments            In 2 weeks [name], NP [facility], [facility]           Passed - Last BP in normal range    BP Readings from Last 1 Encounters:  01/17/21 122/75         . atenolol (TENORMIN) 50 MG tablet [Pharmacy Med Name: ATENOLOL 50 MG TABLET] 90 tablet 0    Sig: TAKE 1 TABLET BY MOUTH EVERY DAY     Cardiovascular: Beta Blockers 2 Failed - 08/13/2021 11:34 AM      Failed - Valid encounter within last 6 months     Recent Outpatient Visits          7 months ago Pre-op exam   [place], NP   7 months ago Essential hypertension, benign   [facility] [name], NP   8 months ago Hypercholesterolemia   [facility] [name], NP   1 year ago Annual physical exam   [facility] [name], NP   2 years ago Essential hypertension, benign   [place], Lilia Argue, [place]      Future Appointments            In 2 weeks [name], NP [facility], [facility]           Passed - Cr in normal range and within 360 days    Creatinine, Ser  Date Value Ref Range Status  01/17/2021 0.62 0.44 - 1.00 mg/dL Final         Passed - Last BP in normal range    BP Readings from Last 1 Encounters:  01/17/21 122/75  Passed - Last Heart Rate in normal range    Pulse Readings from Last 1 Encounters:  01/17/21 (!) 57         . amLODipine (NORVASC) 10 MG tablet [Pharmacy Med Name: AMLODIPINE BESYLATE 10 MG TAB] 90 tablet 0    Sig: TAKE 1 TABLET BY MOUTH EVERY DAY     Cardiovascular: Calcium Channel Blockers 2 Failed - 08/13/2021 11:34 AM      Failed - Valid encounter within last 6 months    Recent Outpatient Visits          7 months ago Pre-op exam   [place], NP   7 months ago Essential hypertension, benign   [facility] [name], NP   8 months ago Hypercholesterolemia   [facility] [name], NP   1 year ago Annual physical exam   [facility] [name], NP   2 years ago Essential hypertension, benign   [place], Lilia Argue, [place]      Future Appointments            In 2 weeks [name], NP [facility], [facility]           Passed - Last BP in normal range    BP Readings from Last 1 Encounters:  01/17/21 122/75          Passed - Last Heart Rate in normal range    Pulse Readings from Last 1 Encounters:  01/17/21 (!) 57         . irbesartan-hydrochlorothiazide (AVALIDE) 300-12.5 MG tablet [Pharmacy Med Name: IRBESARTAN-HCTZ 300-12.5 MG TB] 90 tablet 0    Sig: TAKE 1 TABLET BY MOUTH EVERY DAY     Cardiovascular: ARB + Diuretic Combos Failed - 08/13/2021 11:34 AM      Failed - K in normal range and within 180 days    Potassium  Date Value Ref Range Status  01/17/2021 3.8 3.5 - 5.1 mmol/L Final         Failed - Na in normal range and within 180 days    Sodium  Date Value Ref Range Status  01/17/2021 135 135 - 145 mmol/L Final  01/02/2021 140 134 - 144 mmol/L Final         Failed - Cr in normal range and within 180 days    Creatinine, Ser  Date Value Ref Range Status  01/17/2021 0.62 0.44 - 1.00 mg/dL Final         Failed - eGFR is 10 or above and within 180 days    GFR calc Af Amer  Date Value Ref Range Status  03/05/2020 117 >59 mL/min/1.73 Final    Comment:    **In accordance with recommendations from the NKF-ASN Task force,**   [facility] is in the process of updating its eGFR calculation to the   2021 CKD-EPI creatinine equation that estimates kidney function   without a race variable.    GFR, Estimated  Date Value Ref Range Status  01/17/2021 >60 >60 mL/min Final    Comment:    (NOTE) Calculated using the CKD-EPI Creatinine Equation (2021)    eGFR  Date Value Ref Range Status  01/02/2021 100 >59 mL/min/1.73 Final         Failed - Valid encounter within last 6 months    Recent Outpatient Visits          7 months ago Pre-op exam   [facility] [place],  NP   7 months ago Essential hypertension, benign   [place], NP   8 months ago Hypercholesterolemia   [facility] [name], NP   1 year ago Annual physical exam   [facility] [name], NP   2 years ago Essential  hypertension, benign   [place], Lilia Argue, [place]      Future Appointments            In 2 weeks [name], NP [facility] [place] - Patient is not pregnant      Passed - Last BP in normal range    BP Readings from Last 1 Encounters:  01/17/21 122/75

## 2021-08-28 ENCOUNTER — Ambulatory Visit: Payer: Self-pay | Admitting: Nurse Practitioner

## 2021-09-27 ENCOUNTER — Encounter: Payer: Self-pay | Admitting: Nurse Practitioner

## 2021-09-27 ENCOUNTER — Ambulatory Visit (INDEPENDENT_AMBULATORY_CARE_PROVIDER_SITE_OTHER): Payer: Commercial Managed Care - PPO | Admitting: Nurse Practitioner

## 2021-09-27 VITALS — BP 162/105 | HR 72 | Temp 98.6°F | Wt 189.9 lb

## 2021-09-27 DIAGNOSIS — Z1231 Encounter for screening mammogram for malignant neoplasm of breast: Secondary | ICD-10-CM

## 2021-09-27 DIAGNOSIS — Z0000 Encounter for general adult medical examination without abnormal findings: Secondary | ICD-10-CM

## 2021-09-27 DIAGNOSIS — E7800 Pure hypercholesterolemia, unspecified: Secondary | ICD-10-CM

## 2021-09-27 DIAGNOSIS — I10 Essential (primary) hypertension: Secondary | ICD-10-CM

## 2021-09-27 DIAGNOSIS — E559 Vitamin D deficiency, unspecified: Secondary | ICD-10-CM | POA: Diagnosis not present

## 2021-09-27 DIAGNOSIS — Z1211 Encounter for screening for malignant neoplasm of colon: Secondary | ICD-10-CM

## 2021-09-27 LAB — URINALYSIS, ROUTINE W REFLEX MICROSCOPIC
Bilirubin, UA: NEGATIVE
Glucose, UA: NEGATIVE
Ketones, UA: NEGATIVE
Leukocytes,UA: NEGATIVE
Nitrite, UA: NEGATIVE
Protein,UA: NEGATIVE
RBC, UA: NEGATIVE
Specific Gravity, UA: 1.025 (ref 1.005–1.030)
Urobilinogen, Ur: 0.2 mg/dL (ref 0.2–1.0)
pH, UA: 5.5 (ref 5.0–7.5)

## 2021-09-27 MED ORDER — AMLODIPINE BESYLATE 10 MG PO TABS: 10.0000 mg | ORAL_TABLET | Freq: Every day | ORAL | 1 refills | Status: DC

## 2021-09-27 MED ORDER — HYDRALAZINE HCL 25 MG PO TABS: 25.0000 mg | ORAL_TABLET | Freq: Three times a day (TID) | ORAL | 1 refills | Status: DC

## 2021-09-27 MED ORDER — HYDROCHLOROTHIAZIDE 12.5 MG PO CAPS: ORAL_CAPSULE | ORAL | 1 refills | Status: DC

## 2021-09-27 MED ORDER — IRBESARTAN-HYDROCHLOROTHIAZIDE 300-12.5 MG PO TABS: 1.0000 | ORAL_TABLET | Freq: Every day | ORAL | 1 refills | Status: DC

## 2021-09-27 MED ORDER — ATORVASTATIN CALCIUM 20 MG PO TABS: ORAL_TABLET | ORAL | 1 refills | Status: DC

## 2021-09-27 MED ORDER — ATENOLOL 50 MG PO TABS: 50.0000 mg | ORAL_TABLET | Freq: Every day | ORAL | 1 refills | Status: DC

## 2021-09-27 NOTE — Assessment & Plan Note (Signed)
Chronic.  Controlled.  Continue with current medication regimen of Atorvastatin 20mg.  Refill sent today.  Labs ordered today.  Return to clinic in 6 months for reevaluation.  Call sooner if concerns arise.   

## 2021-09-27 NOTE — Assessment & Plan Note (Signed)
Chronic. Not well controlled.  Patient has been out of medication. Resume taking medications.  Follow up in 1 month.  If patient is able to send in home blood pressure readings can push visit out to 6 months.  Call sooner if concerns arise.

## 2021-09-27 NOTE — Progress Notes (Signed)
BP (!) 162/105   Pulse 72   Temp 98.6 F (37 C) (Oral)   Wt 189 lb 14.4 oz (86.1 kg)   SpO2 97%   BMI 33.64 kg/m    Subjective:    Patient ID: Rhonda Stanley, female    DOB: 12/07/1965, 56 y.o.   MRN: [id number]  HPI: Rhonda Stanley is a 56 y.o. female presenting on 09/27/2021 for comprehensive medical examination. Current medical complaints include:none  She currently lives with: Alone Menopausal Symptoms:  sweating sometimes  HYPERTENSION / HYPERLIPIDEMIA She has been out of a "few" of her medications.  Nos ure which ones.   Satisfied with current treatment? yes Duration of hypertension: chronic BP monitoring frequency: not sure what it is running BP range:  BP medication side effects: no Past BP meds: amlodipine, atenolol, HCTZ, irbesartan (avapro), lisinopril, lisinopril-HCTZ and verapamil, hydralazine Duration of hyperlipidemia: chronic Cholesterol medication side effects: yes Cholesterol supplements: none Past cholesterol medications: none Medication compliance: excellent compliance Aspirin: no Recent stressors: yes Recurrent headaches: no Visual changes: no Palpitations: no Dyspnea: no Chest pain: no Lower extremity edema: no Dizzy/lightheaded: no  Functional Status Survey:       09/27/2021    2:21 PM 03/05/2020    1:15 PM 09/27/2018   10:17 AM 10/19/2017    4:03 PM 09/12/2016    2:23 PM  Fall Risk   Falls in the past year? 0 0 0 Yes No  Number falls in past yr: 0 0 0 2 or more   Injury with Fall? 0 0 0 No   Risk for fall due to : No Fall Risks History of fall(s)     Follow up Falls evaluation completed Falls evaluation completed       Depression Screen    09/27/2021    2:22 PM 12/05/2020    3:19 PM 03/05/2020    1:16 PM 06/08/2019    4:28 PM 10/19/2017    4:03 PM  Depression screen PHQ 2/9  Decreased Interest 0 1 0 0 0  Down, Depressed, Hopeless 0 0 0 0 0  PHQ - 2 Score 0 1 0 0 0  Altered sleeping 0 0   1  Tired, decreased energy 0 1   1   Change in appetite 0 0   1  Feeling bad or failure about yourself  0 0   0  Trouble concentrating 0 0   0  Moving slowly or fidgety/restless 0 0   0  Suicidal thoughts 0 0   0  PHQ-9 Score 0 2   3  Difficult doing work/chores Not difficult at all         Advanced Directives Does patient have a HCPOA?    no If yes, name and contact information:  Does patient have a living will or MOST form?  no  Past Medical History:  Past Medical History:  Diagnosis Date   Hypertension     Surgical History:  Past Surgical History:  Procedure Laterality Date   ORIF TIBIA PLATEAU Right 01/16/2021   Procedure: OPEN REDUCTION INTERNAL FIXATION (ORIF) TIBIAL PLATEAU;  Surgeon: Joen Laura, MD;  Location: [facility];  Service: Orthopedics;  Laterality: Right;   SHOULDER SURGERY Right 2011   TOTAL KNEE ARTHROPLASTY WITH REVISION COMPONENTS Right 01/16/2021   Procedure: TOTAL KNEE ARTHROPLASTY WITH REVISION COMPONENTS;  Surgeon: Joen Laura, MD;  Location: [facility];  Service: Orthopedics;  Laterality: Right;    Medications:  Current Outpatient Medications on File Prior  to Visit  Medication Sig   meloxicam (MOBIC) 15 MG tablet Take 15 mg by mouth daily as needed (inflammation).   PARAGARD INTRAUTERINE COPPER IUD IUD 1 each by Intrauterine route once.   Vitamin D, Ergocalciferol, (DRISDOL) 1.25 MG (50000 UNIT) CAPS capsule Take 1 capsule (50,000 Units total) by mouth every 7 (seven) days.   No current facility-administered medications on file prior to visit.    Allergies:  No Known Allergies  Social History:  Social History   Socioeconomic History   Marital status: Legally Separated    Spouse name: Not on file   Number of children: Not on file   Years of education: Not on file   Highest education level: Not on file  Occupational History   Occupation: [employer]  Tobacco Use   Smoking status: Never   Smokeless tobacco: Never  Vaping Use   Vaping Use: Never used  Substance and  Sexual Activity   Alcohol use: Yes    Alcohol/week: 3.0 standard drinks of alcohol    Types: 3 Standard drinks or equivalent per week    Comment: on occasion   Drug use: No   Sexual activity: Yes    Partners: Male    Birth control/protection: I.U.D.  Other Topics Concern   Not on file  Social History Narrative   Not on file   Social Determinants of Health   Financial Resource Strain: Not on file  Food Insecurity: Not on file  Transportation Needs: Not on file  Physical Activity: Not on file  Stress: Not on file  Social Connections: Not on file  Intimate Partner Violence: Not on file   Social History   Tobacco Use  Smoking Status Never  Smokeless Tobacco Never   Social History   Substance and Sexual Activity  Alcohol Use Yes   Alcohol/week: 3.0 standard drinks of alcohol   Types: 3 Standard drinks or equivalent per week   Comment: on occasion    Family History:  Family History  Problem Relation Age of Onset   Hypertension Mother    Hypertension Father     Past medical history, surgical history, medications, allergies, family history and social history reviewed with patient today and changes made to appropriate areas of the chart.   Review of Systems  Eyes:  Negative for blurred vision and double vision.  Respiratory:  Negative for shortness of breath.   Cardiovascular:  Negative for chest pain, palpitations and leg swelling.  Neurological:  Negative for dizziness and headaches.  All other systems reviewed and are negative.   All other ROS negative except what is listed above and in the HPI.      Objective:    BP (!) 162/105   Pulse 72   Temp 98.6 F (37 C) (Oral)   Wt 189 lb 14.4 oz (86.1 kg)   SpO2 97%   BMI 33.64 kg/m   Wt Readings from Last 3 Encounters:  09/27/21 189 lb 14.4 oz (86.1 kg)  01/16/21 181 lb 7 oz (82.3 kg)  01/03/21 181 lb 7 oz (82.3 kg)    Physical Exam Vitals and nursing note reviewed. Exam conducted with a chaperone  present.  Constitutional:      General: She is awake. She is not in acute distress.    Appearance: Normal appearance. She is well-developed. She is obese. She is not ill-appearing.  HENT:     Head: Normocephalic and atraumatic.     Right Ear: Hearing, tympanic membrane, ear canal and external ear normal.  No drainage.     Left Ear: Hearing, tympanic membrane, ear canal and external ear normal. No drainage.     Nose: Nose normal.     Right Sinus: No maxillary sinus tenderness or frontal sinus tenderness.     Left Sinus: No maxillary sinus tenderness or frontal sinus tenderness.     Mouth/Throat:     Mouth: Mucous membranes are moist.     Pharynx: Oropharynx is clear. Uvula midline. No pharyngeal swelling, oropharyngeal exudate or posterior oropharyngeal erythema.  Eyes:     General: Lids are normal.        Right eye: No discharge.        Left eye: No discharge.     Extraocular Movements: Extraocular movements intact.     Conjunctiva/sclera: Conjunctivae normal.     Pupils: Pupils are equal, round, and reactive to light.     Visual Fields: Right eye visual fields normal and left eye visual fields normal.  Neck:     Thyroid: No thyromegaly.     Vascular: No carotid bruit.     Trachea: Trachea normal.  Cardiovascular:     Rate and Rhythm: Normal rate and regular rhythm.     Heart sounds: Normal heart sounds. No murmur heard.    No gallop.  Pulmonary:     Effort: Pulmonary effort is normal. No accessory muscle usage or respiratory distress.     Breath sounds: Normal breath sounds.  Chest:  Breasts:    Right: Normal.     Left: Normal.  Abdominal:     General: Bowel sounds are normal.     Palpations: Abdomen is soft. There is no hepatomegaly or splenomegaly.     Tenderness: There is no abdominal tenderness.  Genitourinary:    Pubic Area: No rash or pubic lice.      Vagina: Normal.     Cervix: Normal.     Adnexa: Right adnexa normal and left adnexa normal.  Musculoskeletal:         General: Normal range of motion.     Cervical back: Normal range of motion and neck supple.     Right lower leg: No edema.     Left lower leg: No edema.  Lymphadenopathy:     Head:     Right side of head: No submental, submandibular, tonsillar, preauricular or posterior auricular adenopathy.     Left side of head: No submental, submandibular, tonsillar, preauricular or posterior auricular adenopathy.     Cervical: No cervical adenopathy.     Upper Body:     Right upper body: No supraclavicular, axillary or pectoral adenopathy.     Left upper body: No supraclavicular, axillary or pectoral adenopathy.  Skin:    General: Skin is warm and dry.     Capillary Refill: Capillary refill takes less than 2 seconds.     Findings: No rash.  Neurological:     Mental Status: She is alert and oriented to person, place, and time.     Gait: Gait is intact.     Deep Tendon Reflexes: Reflexes are normal and symmetric.     Reflex Scores:      Brachioradialis reflexes are 2+ on the right side and 2+ on the left side.      Patellar reflexes are 2+ on the right side and 2+ on the left side. Psychiatric:        Attention and Perception: Attention normal.        Mood and Affect: Mood normal.  Speech: Speech normal.        Behavior: Behavior normal. Behavior is cooperative.        Thought Content: Thought content normal.        Judgment: Judgment normal.     Results for orders placed or performed during the hospital encounter of 01/16/21  hCG, serum, qualitative (Not at [facility])  Result Value Ref Range   Preg, Serum NEGATIVE NEGATIVE  CBC  Result Value Ref Range   WBC 12.4 (H) 4.0 - 10.5 K/uL   RBC 4.18 3.87 - 5.11 MIL/uL   Hemoglobin 11.0 (L) 12.0 - 15.0 g/dL   HCT 82.4 (L) 23.5 - 36.1 %   MCV 81.6 80.0 - 100.0 fL   MCH 26.3 26.0 - 34.0 pg   MCHC 32.3 30.0 - 36.0 g/dL   RDW 44.3 15.4 - 00.8 %   Platelets 306 150 - 400 K/uL   nRBC 0.0 0.0 - 0.2 %  Basic metabolic panel  Result Value Ref  Range   Sodium 135 135 - 145 mmol/L   Potassium 3.8 3.5 - 5.1 mmol/L   Chloride 102 98 - 111 mmol/L   CO2 25 22 - 32 mmol/L   Glucose, Bld 141 (H) 70 - 99 mg/dL   BUN 11 6 - 20 mg/dL   Creatinine, Ser 6.76 0.44 - 1.00 mg/dL   Calcium 9.1 8.9 - 19.5 mg/dL   GFR, Estimated >09 >32 mL/min   Anion gap 8 5 - 15  ABO/Rh  Result Value Ref Range   ABO/RH(D)      B POS Performed at [facility], [address] [place], [place] [address]       Assessment & Plan:   Problem List Items Addressed This Visit       Cardiovascular and Mediastinum   Essential hypertension, benign - Primary    Chronic. Not well controlled.  Patient has been out of medication. Resume taking medications.  Follow up in 1 month.  If patient is able to send in home blood pressure readings can push visit out to 6 months.  Call sooner if concerns arise.      Relevant Medications   amLODipine (NORVASC) 10 MG tablet   atenolol (TENORMIN) 50 MG tablet   atorvastatin (LIPITOR) 20 MG tablet   hydrALAZINE (APRESOLINE) 25 MG tablet   hydrochlorothiazide (MICROZIDE) 12.5 MG capsule   irbesartan-hydrochlorothiazide (AVALIDE) 300-12.5 MG tablet   Other Relevant Orders   Comprehensive metabolic panel     Other   Hypercholesterolemia    Chronic.  Controlled.  Continue with current medication regimen of Atorvastatin 20mg .  Refill sent today.  Labs ordered today.  Return to clinic in 6 months for reevaluation.  Call sooner if concerns arise.        Relevant Medications   amLODipine (NORVASC) 10 MG tablet   atenolol (TENORMIN) 50 MG tablet   atorvastatin (LIPITOR) 20 MG tablet   hydrALAZINE (APRESOLINE) 25 MG tablet   hydrochlorothiazide (MICROZIDE) 12.5 MG capsule   irbesartan-hydrochlorothiazide (AVALIDE) 300-12.5 MG tablet   Other Relevant Orders   Lipid panel   Vitamin D deficiency    Labs ordered at visit today.  Will make recommendations based on lab results.        Relevant Orders    Vitamin D (25 hydroxy)   Other Visit Diagnoses     Annual physical exam       Relevant Orders   CBC with Differential/Platelet   Comprehensive metabolic panel   Lipid  panel   TSH   Urinalysis, Routine w reflex microscopic   Encounter for screening mammogram for malignant neoplasm of breast       Relevant Orders   MM 3D SCREEN BREAST BILATERAL   Screening for colon cancer       Relevant Orders   Ambulatory referral to Gastroenterology        Preventative Services:  Health Risk Assessment and Personalized Prevention Plan: Breast Cancer Screening: Ordered at visit today Cervical Cancer Screening: Colon Cancer Screening: Ordered at visit today Depression Screening: 03/05/2020 Diabetes Screening: 03/05/2020 Hepatitis B vaccine: Hepatitis C screening: 03/05/2020 HIV Screening: 08/28/2015 Flu Vaccine: Obesity Screening: 03/05/2020 Pneumonia Vaccines (2): NA  Follow up plan: Return in about 1 month (around 10/28/2021) for BP Check.   LABORATORY TESTING:  - Pap smear: pap done today.  Discussed with patient that if IUD is more than 54 years old it should be removed.  Patient is concerned about having a period again.  Advised patient to discuss with GYN options after removal of IUD.  IMMUNIZATIONS:   - Tdap: Tetanus vaccination status reviewed: last tetanus booster within 10 years. - Influenza: postponed to flu season - Pneumovax:  NA - Prevnar: Not applicable  - COVID- due for booster.  Discussed at visit today.   SCREENING: -Mammogram: Ordered today  - Colonoscopy: Ordered today  - Bone Density: Not applicable  -Spirometry: Not applicable   PATIENT COUNSELING:   Advised to take 1 mg of folate supplement per day if capable of pregnancy.   Sexuality: Discussed sexually transmitted diseases, partner selection, use of condoms, avoidance of unintended pregnancy  and contraceptive alternatives.   Advised to avoid cigarette smoking.  I discussed with the patient that most  people either abstain from alcohol or drink within safe limits (<=14/week and <=4 drinks/occasion for males, <=7/weeks and <= 3 drinks/occasion for females) and that the risk for alcohol disorders and other health effects rises proportionally with the number of drinks per week and how often a drinker exceeds daily limits.  Discussed cessation/primary prevention of drug use and availability of treatment for abuse.   Diet: Encouraged to adjust caloric intake to maintain  or achieve ideal body weight, to reduce intake of dietary saturated fat and total fat, to limit sodium intake by avoiding high sodium foods and not adding table salt, and to maintain adequate dietary potassium and calcium preferably from fresh fruits, vegetables, and low-fat dairy products.    stressed the importance of regular exercise  Injury prevention: Discussed safety belts, safety helmets, smoke detector, smoking near bedding or upholstery.   Dental health: Discussed importance of regular tooth brushing, flossing, and dental visits.    NEXT PREVENTATIVE PHYSICAL DUE IN 1 YEAR. Return in about 1 month (around 10/28/2021) for BP Check.

## 2021-09-27 NOTE — Patient Instructions (Signed)
Please call to schedule your mammogram and/or bone density: [facility]  Address: [address], [place], [place] [address] Phone: [phone]  [facility] [place] [address] [place],  [place]  [address] Phone: [phone]   

## 2021-09-27 NOTE — Assessment & Plan Note (Signed)
Labs ordered at visit today.  Will make recommendations based on lab results.   

## 2021-09-28 LAB — COMPREHENSIVE METABOLIC PANEL
ALT: 14 IU/L (ref 0–32)
AST: 17 IU/L (ref 0–40)
Albumin/Globulin Ratio: 1.4 (ref 1.2–2.2)
Albumin: 4.4 g/dL (ref 3.8–4.9)
Alkaline Phosphatase: 121 IU/L (ref 44–121)
BUN/Creatinine Ratio: 20 (ref 9–23)
BUN: 14 mg/dL (ref 6–24)
Bilirubin Total: 0.8 mg/dL (ref 0.0–1.2)
CO2: 26 mmol/L (ref 20–29)
Calcium: 10.3 mg/dL — ABNORMAL HIGH (ref 8.7–10.2)
Chloride: 103 mmol/L (ref 96–106)
Creatinine, Ser: 0.69 mg/dL (ref 0.57–1.00)
Globulin, Total: 3.2 g/dL (ref 1.5–4.5)
Glucose: 86 mg/dL (ref 70–99)
Potassium: 4.7 mmol/L (ref 3.5–5.2)
Sodium: 143 mmol/L (ref 134–144)
Total Protein: 7.6 g/dL (ref 6.0–8.5)
eGFR: 103 mL/min/{1.73_m2} (ref >59)

## 2021-09-28 LAB — LIPID PANEL
Chol/HDL Ratio: 3.6 ratio (ref 0.0–4.4)
Cholesterol, Total: 204 mg/dL — ABNORMAL HIGH (ref 100–199)
HDL: 57 mg/dL (ref >39)
LDL Chol Calc (NIH): 128 mg/dL — ABNORMAL HIGH (ref 0–99)
Triglycerides: 104 mg/dL (ref 0–149)
VLDL Cholesterol Cal: 19 mg/dL (ref 5–40)

## 2021-09-28 LAB — CBC WITH DIFFERENTIAL/PLATELET
Basophils Absolute: 0 10*3/uL (ref 0.0–0.2)
Basos: 1 %
EOS (ABSOLUTE): 0.2 10*3/uL (ref 0.0–0.4)
Eos: 2 %
Hematocrit: 41.8 % (ref 34.0–46.6)
Hemoglobin: 13.4 g/dL (ref 11.1–15.9)
Immature Grans (Abs): 0 10*3/uL (ref 0.0–0.1)
Immature Granulocytes: 0 %
Lymphocytes Absolute: 1.3 10*3/uL (ref 0.7–3.1)
Lymphs: 20 %
MCH: 27 pg (ref 26.6–33.0)
MCHC: 32.1 g/dL (ref 31.5–35.7)
MCV: 84 fL (ref 79–97)
Monocytes Absolute: 0.6 10*3/uL (ref 0.1–0.9)
Monocytes: 10 %
Neutrophils Absolute: 4.3 10*3/uL (ref 1.4–7.0)
Neutrophils: 67 %
Platelets: 344 10*3/uL (ref 150–450)
RBC: 4.97 x10E6/uL (ref 3.77–5.28)
RDW: 13.2 % (ref 11.7–15.4)
WBC: 6.4 10*3/uL (ref 3.4–10.8)

## 2021-09-28 LAB — TSH: TSH: 1.2 u[IU]/mL (ref 0.450–4.500)

## 2021-09-28 LAB — VITAMIN D 25 HYDROXY (VIT D DEFICIENCY, FRACTURES): Vit D, 25-Hydroxy: 18.2 ng/mL — ABNORMAL LOW (ref 30.0–100.0)

## 2021-09-30 MED ORDER — VITAMIN D (ERGOCALCIFEROL) 1.25 MG (50000 UNIT) PO CAPS: 50000.0000 [IU] | ORAL_CAPSULE | ORAL | 1 refills | Status: DC

## 2021-09-30 NOTE — Progress Notes (Signed)
HI Rhonda Stanley. It was nice to see you last week.  Your lab work looks good.  Your vitamin D is still low.  I recommend resuming the once weekly vitamin D.  I have sent in the refill for you.  Your cholesterol has improved from prior. No other concerns at this time. Continue with your current medication regimen.  Follow up as discussed.  Please let me know if you have any questions.

## 2021-09-30 NOTE — Addendum Note (Signed)
Addended by: Larae Grooms on: 09/30/2021 07:52 AM   Modules accepted: Orders

## 2021-10-10 ENCOUNTER — Telehealth: Payer: Self-pay

## 2021-10-10 NOTE — Telephone Encounter (Signed)
Called and LVM asking for patient to please return my call. Patient is due for mammogram. Can call and schedule this for the patient if she would like.   OK for PEC to speak to patient and find out if she would like to have her mammogram scheduled. Please find out if she prefers [place] or [place], and what days or times of day the patient prefers.

## 2021-10-10 NOTE — Telephone Encounter (Signed)
Pt returned the call. Advised pt of message from [place]. Pt says that she will call back when she know days and times that work best for her.

## 2021-10-28 ENCOUNTER — Ambulatory Visit: Payer: Commercial Managed Care - PPO | Admitting: Nurse Practitioner

## 2021-12-18 ENCOUNTER — Other Ambulatory Visit: Payer: Self-pay | Admitting: Nurse Practitioner

## 2021-12-18 DIAGNOSIS — E7800 Pure hypercholesterolemia, unspecified: Secondary | ICD-10-CM

## 2021-12-18 NOTE — Telephone Encounter (Signed)
Rx was sent to pharmacy 09/27/21 #90/1.   Requested Prescriptions  Pending Prescriptions Disp Refills   atorvastatin (LIPITOR) 20 MG tablet [Pharmacy Med Name: ATORVASTATIN 20 MG TABLET] 90 tablet 1    Sig: TAKE 1 TABLET BY MOUTH EVERY DAY     Cardiovascular:  Antilipid - Statins Failed - 12/18/2021  2:01 AM      Failed - Lipid Panel in normal range within the last 12 months    Cholesterol, Total  Date Value Ref Range Status  09/27/2021 204 (H) 100 - 199 mg/dL Final   Cholesterol Piccolo, Waived  Date Value Ref Range Status  10/13/2014 170 <200 mg/dL Final    Comment:                            Desirable                <200                         Borderline High      200- 239                         High                     >239    LDL Chol Calc (NIH)  Date Value Ref Range Status  09/27/2021 128 (H) 0 - 99 mg/dL Final   HDL  Date Value Ref Range Status  09/27/2021 57 >39 mg/dL Final   Triglycerides  Date Value Ref Range Status  09/27/2021 104 0 - 149 mg/dL Final   Triglycerides Piccolo,Waived  Date Value Ref Range Status  10/13/2014 81 <150 mg/dL Final    Comment:                            Normal                   <150                         Borderline High     150 - 199                         High                200 - 499                         Very High                >499          Passed - Patient is not pregnant      Passed - Valid encounter within last 12 months    Recent Outpatient Visits           2 months ago Essential hypertension, benign   [facility] Larae Grooms, NP   11 months ago Pre-op exam   [facility] Larae Grooms, NP   12 months ago Essential hypertension, benign   [facility] Larae Grooms, NP   1 year ago Hypercholesterolemia   [facility] Larae Grooms, NP   1 year ago Annual physical exam   [facility] Larae Grooms, NP

## 2021-12-30 ENCOUNTER — Other Ambulatory Visit: Payer: Self-pay | Admitting: Nurse Practitioner

## 2021-12-31 NOTE — Telephone Encounter (Signed)
Refilled 09/27/2021 #90 1 rf Requested Prescriptions  Pending Prescriptions Disp Refills   hydrochlorothiazide (MICROZIDE) 12.5 MG capsule [Pharmacy Med Name: HYDROCHLOROTHIAZIDE 12.5 MG CP] 90 capsule 1    Sig: TAKE 1 CAPSULE BY MOUTH EVERY DAY     Cardiovascular: Diuretics - Thiazide Failed - 12/30/2021  8:32 PM      Failed - Last BP in normal range    BP Readings from Last 1 Encounters:  09/27/21 (!) 162/105         Passed - Cr in normal range and within 180 days    Creatinine, Ser  Date Value Ref Range Status  09/27/2021 0.69 0.57 - 1.00 mg/dL Final         Passed - K in normal range and within 180 days    Potassium  Date Value Ref Range Status  09/27/2021 4.7 3.5 - 5.2 mmol/L Final         Passed - Na in normal range and within 180 days    Sodium  Date Value Ref Range Status  09/27/2021 143 134 - 144 mmol/L Final         Passed - Valid encounter within last 6 months    Recent Outpatient Visits           3 months ago Essential hypertension, benign   [facility] Larae Grooms, NP   12 months ago Pre-op exam   [facility] Larae Grooms, NP   1 year ago Essential hypertension, benign   [facility] Larae Grooms, NP   1 year ago Hypercholesterolemia   [facility] Larae Grooms, NP   1 year ago Annual physical exam   [facility] Larae Grooms, NP

## 2022-02-11 IMAGING — XA DG KNEE 1-2V*R*
1 series · 7 of 7 positions shown · non-contrast
Comparison: 07/14/2019, CT 12/07/2020 the images demonstrate total
left knee replacement

CLINICAL DATA: Knee revision

EXAM:
RIGHT KNEE - 1-2 VIEW

[Series 1: unknown protocol · 7 of 7 slices shown]
[im 1/7]
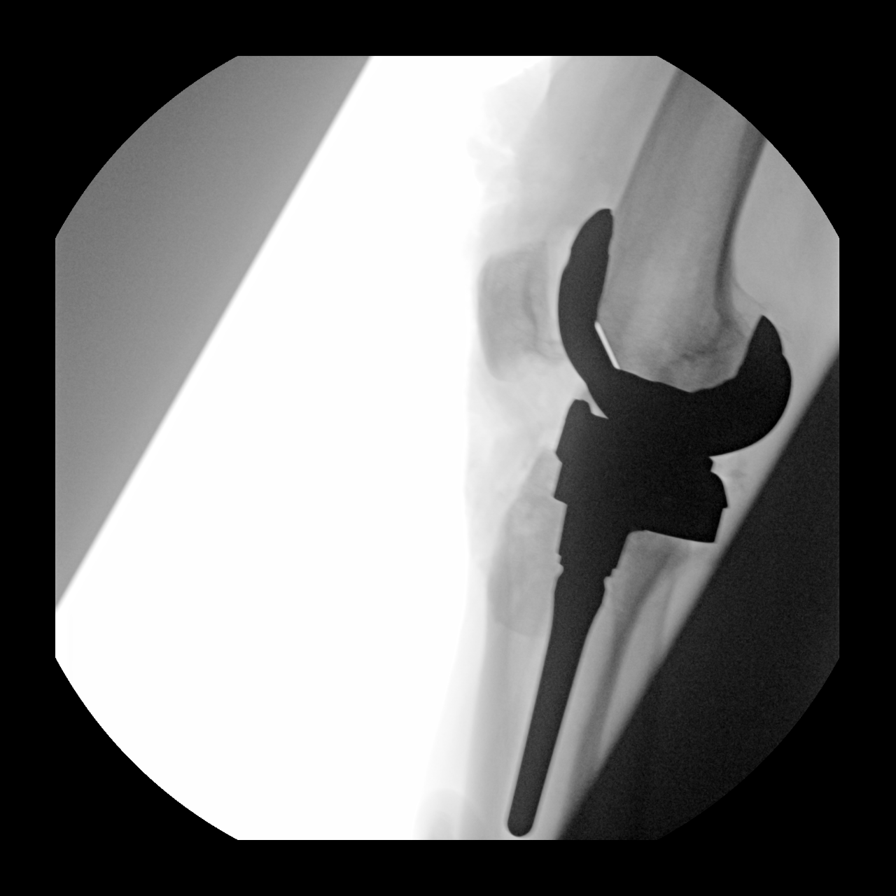
[im 2/7]
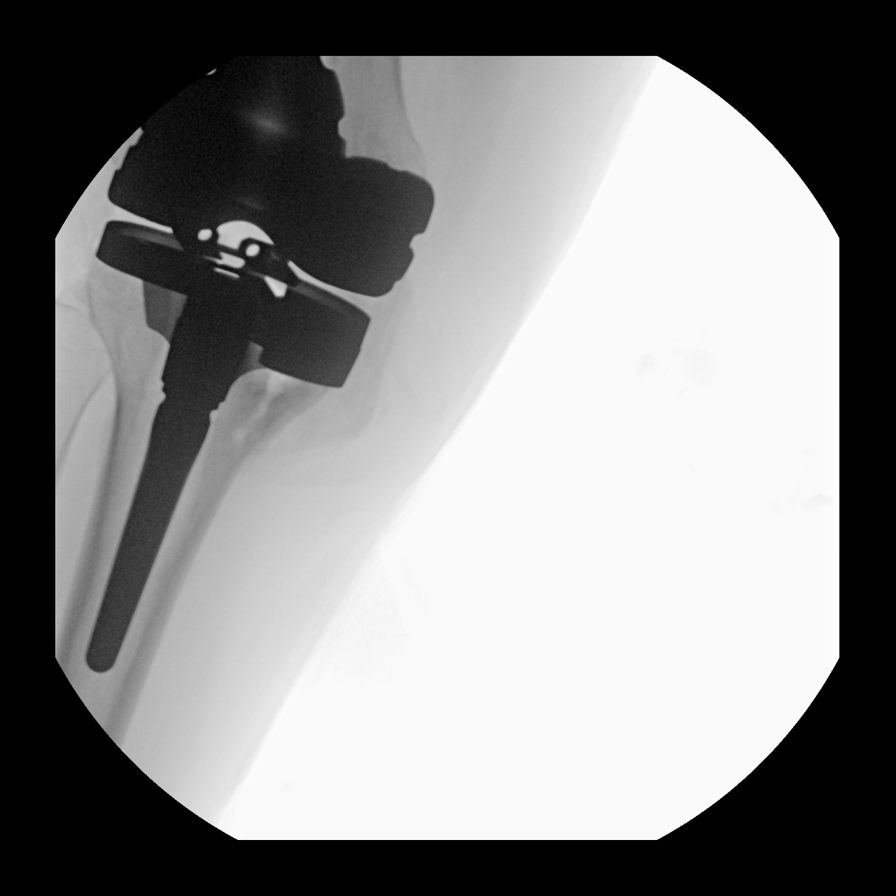
[im 3/7]
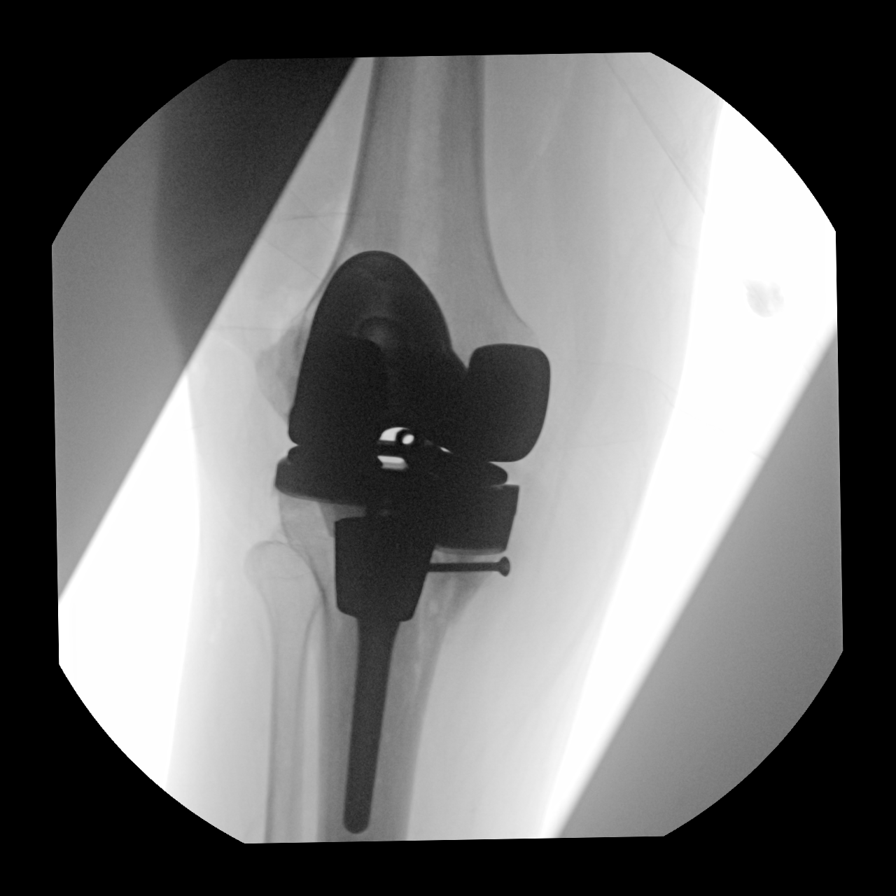
[im 4/7]
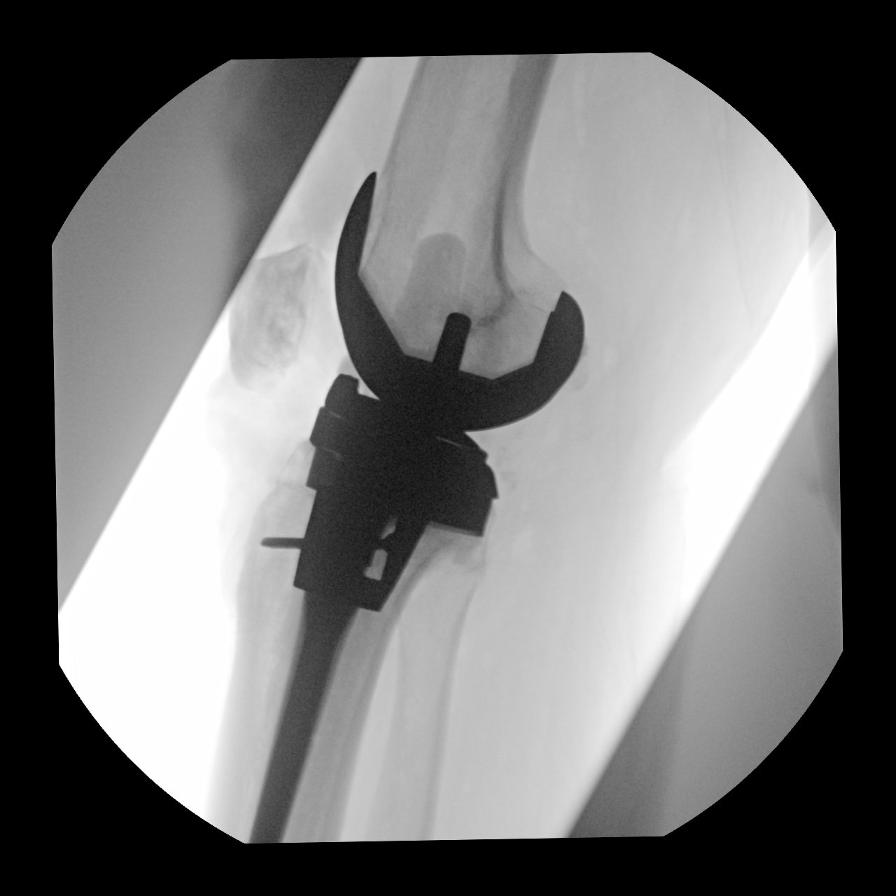
[im 5/7]
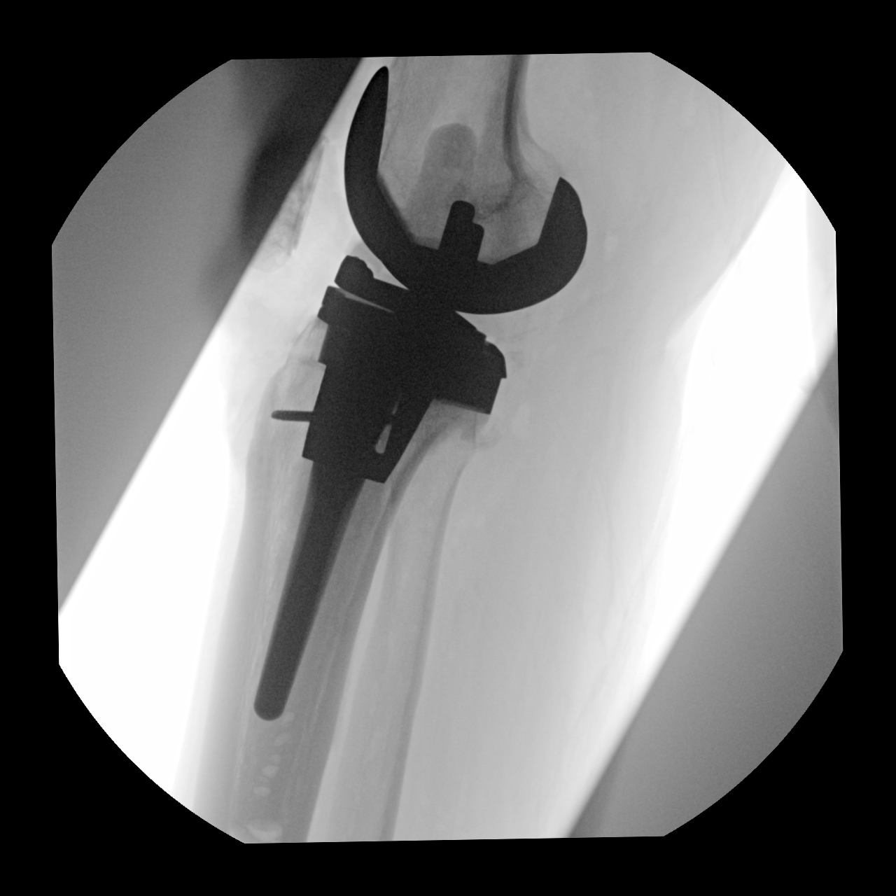
[im 6/7]
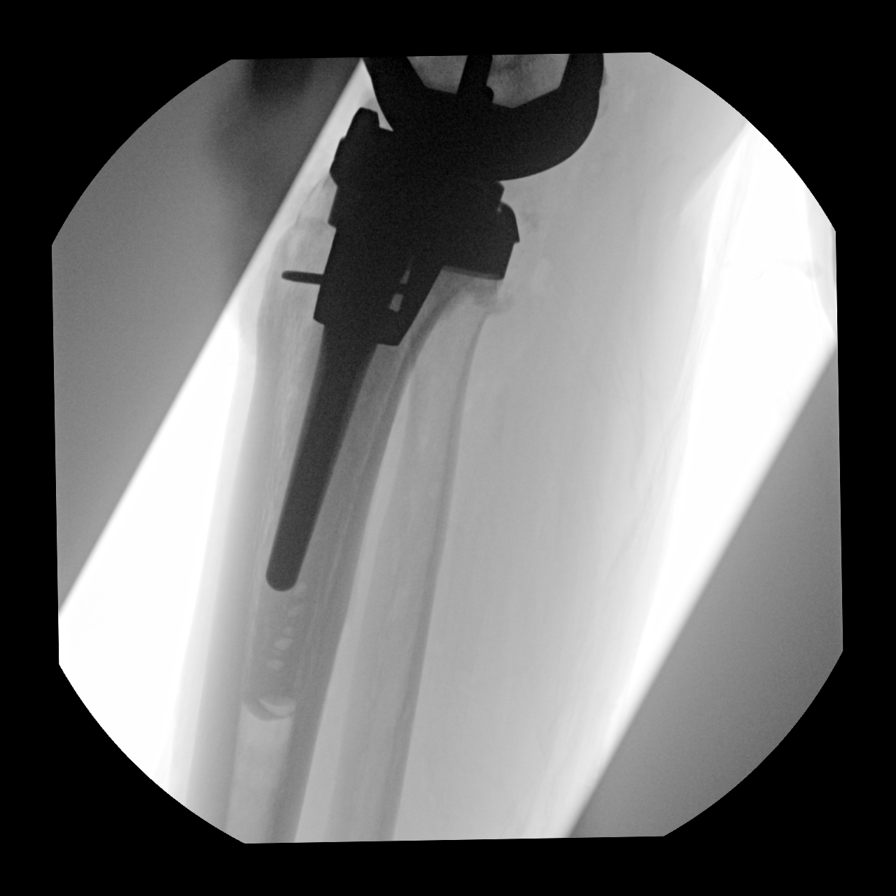
[im 7/7]
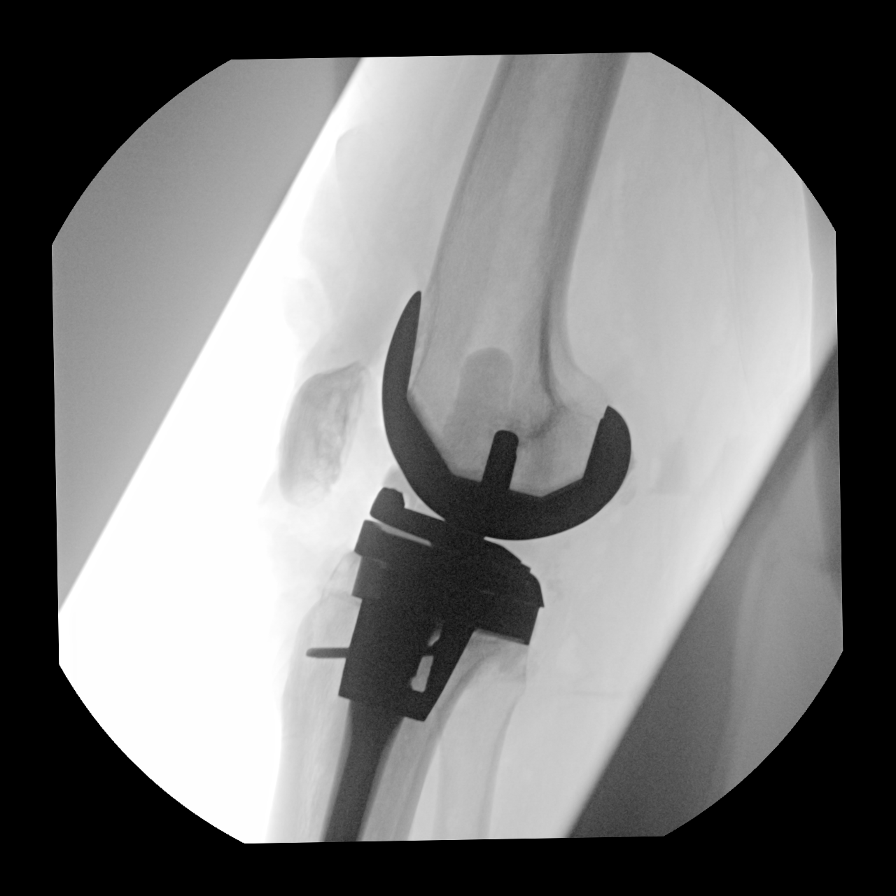

[7 of 7 positions shown; findings below may reference images not displayed]

FINDINGS: Seven low resolution intraoperative spot views of the right knee.
Total fluoroscopy time was 9 seconds. The images demonstrate total
knee replacement with normal alignment. Fixating screw within the
medial metaphysis of the tibia at the site of CT demonstrated
fracture. There is anatomic alignment.
IMPRESSION: Intraoperative fluoroscopic assistance provided during knee
replacement surgery and screw fixation of tibial medial metaphyseal
fracture.

## 2022-03-26 ENCOUNTER — Other Ambulatory Visit: Payer: Self-pay | Admitting: Nurse Practitioner

## 2022-03-26 DIAGNOSIS — I10 Essential (primary) hypertension: Secondary | ICD-10-CM

## 2022-03-26 NOTE — Telephone Encounter (Signed)
Attempted to call patient to scheduled appointment- left message to call office- courtesy 30 day RF given Requested Prescriptions  Pending Prescriptions Disp Refills   amLODipine ([place]) 10 MG tablet [Pharmacy Med Name: AMLODIPINE BESYLATE 10 MG TAB] 30 tablet 0    Sig: TAKE 1 TABLET BY MOUTH EVERY DAY     Cardiovascular: Calcium Channel Blockers 2 Failed - 03/26/2022  1:29 AM      Failed - Last BP in normal range    BP Readings from Last 1 Encounters:  09/27/21 (!) 162/105         Passed - Last Heart Rate in normal range    Pulse Readings from Last 1 Encounters:  09/27/21 72         Passed - Valid encounter within last 6 months    Recent Outpatient Visits           6 months ago Essential hypertension, benign   [place] [name], NP   1 year ago Pre-op exam   [place] [name], NP   1 year ago Essential hypertension, benign   [place] [name], NP   1 year ago Hypercholesterolemia   [place] [name], NP   2 years ago Annual physical exam   [place] [name], NP               atenolol (TENORMIN) 50 MG tablet [Pharmacy Med Name: ATENOLOL 50 MG TABLET] 30 tablet 0    Sig: TAKE 1 TABLET BY MOUTH EVERY DAY     Cardiovascular: Beta Blockers 2 Failed - 03/26/2022  1:29 AM      Failed - Last BP in normal range    BP Readings from Last 1 Encounters:  09/27/21 (!) 162/105         Passed - Cr in normal range and within 360 days    Creatinine, Ser  Date Value Ref Range Status  09/27/2021 0.69 0.57 - 1.00 mg/dL Final         Passed - Last Heart Rate in normal range    Pulse Readings from Last 1 Encounters:  09/27/21 72         Passed - Valid encounter within last 6 months    Recent Outpatient Visits           6 months ago Essential hypertension, benign   [place] [name], NP   1 year ago Pre-op exam   [place], NP   1 year ago Essential hypertension, benign   [place] [name], NP   1 year ago Hypercholesterolemia   [place] [name], NP   2 years ago Annual physical exam   [place] [name], NP

## 2022-03-28 ENCOUNTER — Other Ambulatory Visit: Payer: Self-pay

## 2022-03-28 DIAGNOSIS — E7800 Pure hypercholesterolemia, unspecified: Secondary | ICD-10-CM

## 2022-03-28 DIAGNOSIS — I10 Essential (primary) hypertension: Secondary | ICD-10-CM

## 2022-03-28 NOTE — Telephone Encounter (Signed)
Called and spoke with patient. She states that she cannot get another day off from work until May. Advised patient that she will need to schedule a follow up as soon as she can. Prescriptions t'd up for the provider.   Copied from [place] [phone]. Topic: General - Other >> Mar 26, 2022  4:50 PM Everette C wrote: Reason for CRM: The patient would like to speak with a member of clinical staff regarding their previously requested refills Please contact further when possible

## 2022-03-30 ENCOUNTER — Other Ambulatory Visit: Payer: Self-pay | Admitting: Nurse Practitioner

## 2022-03-30 DIAGNOSIS — I10 Essential (primary) hypertension: Secondary | ICD-10-CM

## 2022-03-31 NOTE — Telephone Encounter (Signed)
Requested medication (s) are due for refill today:   Yes  Requested medication (s) are on the active medication list:   Yes  Future visit scheduled:   No    See pt's note on 03/28/2022 about not getting off of work until May   Last ordered: Returned for provider's review and decision   Requested Prescriptions  Pending Prescriptions Disp Refills   irbesartan-hydrochlorothiazide (AVALIDE) 300-12.5 MG tablet [Pharmacy Med Name: IRBESARTAN-HCTZ 300-12.5 MG TB] 90 tablet 1    Sig: TAKE 1 TABLET BY MOUTH EVERY DAY     Cardiovascular: ARB + Diuretic Combos Failed - 03/30/2022  8:40 AM      Failed - K in normal range and within 180 days    Potassium  Date Value Ref Range Status  09/27/2021 4.7 3.5 - 5.2 mmol/L Final         Failed - Na in normal range and within 180 days    Sodium  Date Value Ref Range Status  09/27/2021 143 134 - 144 mmol/L Final         Failed - Cr in normal range and within 180 days    Creatinine, Ser  Date Value Ref Range Status  09/27/2021 0.69 0.57 - 1.00 mg/dL Final         Failed - eGFR is 10 or above and within 180 days    GFR calc Af Amer  Date Value Ref Range Status  03/05/2020 117 >59 mL/min/1.73 Final    Comment:    **In accordance with recommendations from the NKF-ASN Task force,**   [facility] is in the process of updating its eGFR calculation to the   2021 CKD-EPI creatinine equation that estimates kidney function   without a race variable.    GFR, Estimated  Date Value Ref Range Status  01/17/2021 >60 >60 mL/min Final    Comment:    (NOTE) Calculated using the CKD-EPI Creatinine Equation (2021)    eGFR  Date Value Ref Range Status  09/27/2021 103 >59 mL/min/1.73 Final         Failed - Last BP in normal range    BP Readings from Last 1 Encounters:  09/27/21 (!) 162/105         Failed - Valid encounter within last 6 months    Recent Outpatient Visits           6 months ago Essential hypertension, benign   [place] [name], NP   1 year ago Pre-op exam   [place], NP   1 year ago Essential hypertension, benign   [place] [name], NP   1 year ago Hypercholesterolemia   [place] [name], NP   2 years ago Annual physical exam   [place], NP              Passed - Patient is not pregnant

## 2022-04-24 ENCOUNTER — Other Ambulatory Visit: Payer: Self-pay | Admitting: Nurse Practitioner

## 2022-04-24 DIAGNOSIS — I10 Essential (primary) hypertension: Secondary | ICD-10-CM

## 2022-04-24 NOTE — Telephone Encounter (Signed)
Requested medication (s) are due for refill today: yes   Requested medication (s) are on the active medication list: yes   Last refill:  03/26/22 #30 0 refills  Future visit scheduled: no   Notes to clinic:  no refills remain. Needs appt. Called patient to schedule appt for med refills. No answer, LVMTCB. Pharmacy comment: REQUEST FOR 90 DAYS PRESCRIPTION. DX Code Needed.  Do you want to refill Rx?     Requested Prescriptions  Pending Prescriptions Disp Refills   atenolol (TENORMIN) 50 MG tablet [Pharmacy Med Name: ATENOLOL 50 MG TABLET] 90 tablet 1    Sig: TAKE 1 TABLET BY MOUTH EVERY DAY     Cardiovascular: Beta Blockers 2 Failed - 04/24/2022 12:34 PM      Failed - Last BP in normal range    BP Readings from Last 1 Encounters:  09/27/21 (!) 162/105         Failed - Valid encounter within last 6 months    Recent Outpatient Visits           6 months ago Essential hypertension, benign   [place] [name], NP   1 year ago Pre-op exam   [place] [name], NP   1 year ago Essential hypertension, benign   [place] [name], NP   1 year ago Hypercholesterolemia   [place] [name], NP   2 years ago Annual physical exam   [place] [name], NP              Passed - Cr in normal range and within 360 days    Creatinine, Ser  Date Value Ref Range Status  09/27/2021 0.69 0.57 - 1.00 mg/dL Final         Passed - Last Heart Rate in normal range    Pulse Readings from Last 1 Encounters:  09/27/21 72          amLODipine (NORVASC) 10 MG tablet [Pharmacy Med Name: AMLODIPINE BESYLATE 10 MG TAB] 90 tablet 1    Sig: TAKE 1 TABLET BY MOUTH EVERY DAY     Cardiovascular: Calcium Channel Blockers 2 Failed - 04/24/2022 12:34 PM      Failed - Last BP in normal range    BP Readings from Last 1  Encounters:  09/27/21 (!) 162/105         Failed - Valid encounter within last 6 months    Recent Outpatient Visits           6 months ago Essential hypertension, benign   [place] [name], NP   1 year ago Pre-op exam   [place] [name], NP   1 year ago Essential hypertension, benign   [place] [name], NP   1 year ago Hypercholesterolemia   [place] [name], NP   2 years ago Annual physical exam   [place], NP              Passed - Last Heart Rate in normal range    Pulse Readings from Last 1 Encounters:  09/27/21 72

## 2022-04-24 NOTE — Telephone Encounter (Signed)
Called patient to schedule appt for medication refills no answer, LVMTCB

## 2022-04-28 ENCOUNTER — Other Ambulatory Visit: Payer: Self-pay | Admitting: Nurse Practitioner

## 2022-04-28 DIAGNOSIS — I10 Essential (primary) hypertension: Secondary | ICD-10-CM

## 2022-04-29 NOTE — Telephone Encounter (Signed)
Unable to refill per protocol, appointment needed.  Duplicate request.  Requested Prescriptions  Pending Prescriptions Disp Refills   atenolol (TENORMIN) 50 MG tablet [Pharmacy Med Name: ATENOLOL 50 MG TABLET] 30 tablet 0    Sig: TAKE 1 TABLET BY MOUTH EVERY DAY     Cardiovascular: Beta Blockers 2 Failed - 04/28/2022  2:22 AM      Failed - Last BP in normal range    BP Readings from Last 1 Encounters:  09/27/21 (!) 162/105         Failed - Valid encounter within last 6 months    Recent Outpatient Visits           7 months ago Essential hypertension, benign   [place] [name], NP   1 year ago Pre-op exam   [place] [name], NP   1 year ago Essential hypertension, benign   [place] [name], NP   1 year ago Hypercholesterolemia   [place] [name], NP   2 years ago Annual physical exam   [place] [name], NP              Passed - Cr in normal range and within 360 days    Creatinine, Ser  Date Value Ref Range Status  09/27/2021 0.69 0.57 - 1.00 mg/dL Final         Passed - Last Heart Rate in normal range    Pulse Readings from Last 1 Encounters:  09/27/21 72          amLODipine (NORVASC) 10 MG tablet [Pharmacy Med Name: AMLODIPINE BESYLATE 10 MG TAB] 30 tablet 0    Sig: TAKE 1 TABLET BY MOUTH EVERY DAY     Cardiovascular: Calcium Channel Blockers 2 Failed - 04/28/2022  2:22 AM      Failed - Last BP in normal range    BP Readings from Last 1 Encounters:  09/27/21 (!) 162/105         Failed - Valid encounter within last 6 months    Recent Outpatient Visits           7 months ago Essential hypertension, benign   [place] [name], NP   1 year ago Pre-op exam   [place] [name], NP   1 year ago Essential  hypertension, benign   [place] [name], NP   1 year ago Hypercholesterolemia   [place] [name], NP   2 years ago Annual physical exam   [place], NP              Passed - Last Heart Rate in normal range    Pulse Readings from Last 1 Encounters:  09/27/21 72

## 2022-06-18 ENCOUNTER — Ambulatory Visit: Payer: Commercial Managed Care - PPO | Admitting: Nurse Practitioner

## 2022-06-19 ENCOUNTER — Ambulatory Visit (INDEPENDENT_AMBULATORY_CARE_PROVIDER_SITE_OTHER): Payer: Commercial Managed Care - PPO | Admitting: Nurse Practitioner

## 2022-06-19 ENCOUNTER — Encounter: Payer: Self-pay | Admitting: Nurse Practitioner

## 2022-06-19 VITALS — BP 186/129 | HR 88 | Temp 98.9°F | Wt 185.7 lb

## 2022-06-19 DIAGNOSIS — E7800 Pure hypercholesterolemia, unspecified: Secondary | ICD-10-CM

## 2022-06-19 DIAGNOSIS — E669 Obesity, unspecified: Secondary | ICD-10-CM

## 2022-06-19 DIAGNOSIS — E559 Vitamin D deficiency, unspecified: Secondary | ICD-10-CM | POA: Diagnosis not present

## 2022-06-19 DIAGNOSIS — Z0000 Encounter for general adult medical examination without abnormal findings: Secondary | ICD-10-CM | POA: Diagnosis not present

## 2022-06-19 DIAGNOSIS — I10 Essential (primary) hypertension: Secondary | ICD-10-CM

## 2022-06-19 DIAGNOSIS — Z136 Encounter for screening for cardiovascular disorders: Secondary | ICD-10-CM

## 2022-06-19 LAB — URINALYSIS, ROUTINE W REFLEX MICROSCOPIC
Bilirubin, UA: NEGATIVE
Glucose, UA: NEGATIVE
Ketones, UA: NEGATIVE
Leukocytes,UA: NEGATIVE
Nitrite, UA: NEGATIVE
RBC, UA: NEGATIVE
Specific Gravity, UA: 1.02 (ref 1.005–1.030)
Urobilinogen, Ur: 1 mg/dL (ref 0.2–1.0)
pH, UA: 5 (ref 5.0–7.5)

## 2022-06-19 MED ORDER — AMLODIPINE BESYLATE 10 MG PO TABS
10.0000 mg | ORAL_TABLET | Freq: Every day | ORAL | 0 refills | Status: DC
Start: 2022-06-19 — End: 2022-07-11

## 2022-06-19 MED ORDER — ATORVASTATIN CALCIUM 20 MG PO TABS
ORAL_TABLET | ORAL | 1 refills | Status: DC
Start: 2022-06-19 — End: 2023-01-12

## 2022-06-19 MED ORDER — IRBESARTAN-HYDROCHLOROTHIAZIDE 300-12.5 MG PO TABS: 1.0000 | ORAL_TABLET | Freq: Every day | ORAL | 1 refills | Status: DC

## 2022-06-19 NOTE — Assessment & Plan Note (Signed)
Chronic.  Not well controlled.  Patient has been out of medication.  Will restart Amlodipine, Irbesartan and HCTZ.  Labs ordered.  Follow up in 1 month.  Call sooner if concerns arise.

## 2022-06-19 NOTE — Assessment & Plan Note (Signed)
Labs ordered at visit today.  Will make recommendations based on lab results.   

## 2022-06-19 NOTE — Assessment & Plan Note (Signed)
Recommended eating smaller high protein, low fat meals more frequently and exercising 30 mins a day 5 times a week with a goal of 10-15lb weight loss in the next 3 months.  

## 2022-06-19 NOTE — Progress Notes (Signed)
BP (!) 186/129   Pulse 88   Temp 98.9 F (37.2 C) (Oral)   Wt 185 lb 11.2 oz (84.2 kg)   LMP  (LMP Unknown)   SpO2 99%   BMI 32.90 kg/m    Subjective:    Patient ID: Rhonda Stanley, female    DOB: 08-24-1965, 57 y.o.   MRN: [id number]  HPI: Rhonda Stanley is a 57 y.o. female presenting on 06/19/2022 for comprehensive medical examination. Current medical complaints include: stress  She currently lives with: Menopausal Symptoms: no  HYPERTENSION without Chronic Kidney Disease Hypertension status: controlled  Satisfied with current treatment? no Duration of hypertension: years BP monitoring frequency:  not checking BP range:  BP medication side effects:  no Medication compliance: poor compliance Previous BP meds:amlodipine, HCTZ, and irbesartan (avapro) Aspirin: no Recurrent headaches: no Visual changes: no Palpitations: no Dyspnea: no Chest pain: no Lower extremity edema: no Dizzy/lightheaded: no    Depression Screen done today and results listed below:     06/19/2022    1:31 PM 09/27/2021    2:22 PM 12/05/2020    3:19 PM 03/05/2020    1:16 PM 06/08/2019    4:28 PM  Depression screen PHQ 2/9  Decreased Interest 1 0 1 0 0  Down, Depressed, Hopeless 1 0 0 0 0  PHQ - 2 Score 2 0 1 0 0  Altered sleeping 1 0 0    Tired, decreased energy 2 0 1    Change in appetite 1 0 0    Feeling bad or failure about yourself  2 0 0    Trouble concentrating 2 0 0    Moving slowly or fidgety/restless 1 0 0    Suicidal thoughts 0 0 0    PHQ-9 Score 11 0 2    Difficult doing work/chores Somewhat difficult Not difficult at all       The patient does not have a history of falls. I did complete a risk assessment for falls. A plan of care for falls was documented.   Past Medical History:  Past Medical History:  Diagnosis Date   Hypertension     Surgical History:  Past Surgical History:  Procedure Laterality Date   ORIF TIBIA PLATEAU Right 01/16/2021   Procedure: OPEN  REDUCTION INTERNAL FIXATION (ORIF) TIBIAL PLATEAU;  Surgeon: Joen Laura, MD;  Location: [facility];  Service: Orthopedics;  Laterality: Right;   SHOULDER SURGERY Right 2011   TOTAL KNEE ARTHROPLASTY WITH REVISION COMPONENTS Right 01/16/2021   Procedure: TOTAL KNEE ARTHROPLASTY WITH REVISION COMPONENTS;  Surgeon: Joen Laura, MD;  Location: [facility];  Service: Orthopedics;  Laterality: Right;    Medications:  Current Outpatient Medications on File Prior to Visit  Medication Sig   atenolol (TENORMIN) 50 MG tablet TAKE 1 TABLET BY MOUTH EVERY DAY (Patient not taking: Reported on 06/19/2022)   hydrALAZINE (APRESOLINE) 25 MG tablet Take 1 tablet (25 mg total) by mouth 3 (three) times daily. (Patient not taking: Reported on 06/19/2022)   hydrochlorothiazide (MICROZIDE) 12.5 MG capsule TAKE 1 CAPSULE BY MOUTH EVERY DAY (Patient not taking: Reported on 06/19/2022)   PARAGARD INTRAUTERINE COPPER IUD IUD 1 each by Intrauterine route once. (Patient not taking: Reported on 06/19/2022)   Vitamin D, Ergocalciferol, (DRISDOL) 1.25 MG (50000 UNIT) CAPS capsule Take 1 capsule (50,000 Units total) by mouth every 7 (seven) days. (Patient not taking: Reported on 06/19/2022)   No current facility-administered medications on file prior to visit.    Allergies:  No Known Allergies  Social History:  Social History   Socioeconomic History   Marital status: Legally Separated    Spouse name: Not on file   Number of children: Not on file   Years of education: Not on file   Highest education level: Not on file  Occupational History   Occupation: [employer]  Tobacco Use   Smoking status: Never   Smokeless tobacco: Never  Vaping Use   Vaping Use: Never used  Substance and Sexual Activity   Alcohol use: Yes    Alcohol/week: 3.0 standard drinks of alcohol    Types: 3 Standard drinks or equivalent per week    Comment: on occasion   Drug use: No   Sexual activity: Yes    Partners: Male    Birth  control/protection: I.U.D.  Other Topics Concern   Not on file  Social History Narrative   Not on file   Social Determinants of Health   Financial Resource Strain: Not on file  Food Insecurity: Not on file  Transportation Needs: Not on file  Physical Activity: Not on file  Stress: Not on file  Social Connections: Not on file  Intimate Partner Violence: Not on file   Social History   Tobacco Use  Smoking Status Never  Smokeless Tobacco Never   Social History   Substance and Sexual Activity  Alcohol Use Yes   Alcohol/week: 3.0 standard drinks of alcohol   Types: 3 Standard drinks or equivalent per week   Comment: on occasion    Family History:  Family History  Problem Relation Age of Onset   Hypertension Mother    Hypertension Father     Past medical history, surgical history, medications, allergies, family history and social history reviewed with patient today and changes made to appropriate areas of the chart.   Review of Systems  Eyes:  Negative for blurred vision and double vision.  Respiratory:  Negative for shortness of breath.   Cardiovascular:  Negative for chest pain, palpitations and leg swelling.  Neurological:  Negative for dizziness and headaches.   All other ROS negative except what is listed above and in the HPI.      Objective:    BP (!) 186/129   Pulse 88   Temp 98.9 F (37.2 C) (Oral)   Wt 185 lb 11.2 oz (84.2 kg)   LMP  (LMP Unknown)   SpO2 99%   BMI 32.90 kg/m   Wt Readings from Last 3 Encounters:  06/19/22 185 lb 11.2 oz (84.2 kg)  09/27/21 189 lb 14.4 oz (86.1 kg)  01/16/21 181 lb 7 oz (82.3 kg)    Physical Exam Vitals and nursing note reviewed.  Constitutional:      General: She is awake. She is not in acute distress.    Appearance: Normal appearance. She is well-developed. She is obese. She is not ill-appearing.  HENT:     Head: Normocephalic and atraumatic.     Right Ear: Hearing, tympanic membrane, ear canal and external  ear normal. No drainage.     Left Ear: Hearing, tympanic membrane, ear canal and external ear normal. No drainage.     Nose: Nose normal.     Right Sinus: No maxillary sinus tenderness or frontal sinus tenderness.     Left Sinus: No maxillary sinus tenderness or frontal sinus tenderness.     Mouth/Throat:     Mouth: Mucous membranes are moist.     Pharynx: Oropharynx is clear. Uvula midline. No pharyngeal swelling, oropharyngeal  exudate or posterior oropharyngeal erythema.  Eyes:     General: Lids are normal.        Right eye: No discharge.        Left eye: No discharge.     Extraocular Movements: Extraocular movements intact.     Conjunctiva/sclera: Conjunctivae normal.     Pupils: Pupils are equal, round, and reactive to light.     Visual Fields: Right eye visual fields normal and left eye visual fields normal.  Neck:     Thyroid: No thyromegaly.     Vascular: No carotid bruit.     Trachea: Trachea normal.  Cardiovascular:     Rate and Rhythm: Normal rate and regular rhythm.     Heart sounds: Normal heart sounds. No murmur heard.    No gallop.  Pulmonary:     Effort: Pulmonary effort is normal. No accessory muscle usage or respiratory distress.     Breath sounds: Normal breath sounds.  Chest:  Breasts:    Right: Normal.     Left: Normal.  Abdominal:     General: Bowel sounds are normal.     Palpations: Abdomen is soft. There is no hepatomegaly or splenomegaly.     Tenderness: There is no abdominal tenderness.  Musculoskeletal:        General: Normal range of motion.     Cervical back: Normal range of motion and neck supple.     Right lower leg: No edema.     Left lower leg: No edema.  Lymphadenopathy:     Head:     Right side of head: No submental, submandibular, tonsillar, preauricular or posterior auricular adenopathy.     Left side of head: No submental, submandibular, tonsillar, preauricular or posterior auricular adenopathy.     Cervical: No cervical adenopathy.      Upper Body:     Right upper body: No supraclavicular, axillary or pectoral adenopathy.     Left upper body: No supraclavicular, axillary or pectoral adenopathy.  Skin:    General: Skin is warm and dry.     Capillary Refill: Capillary refill takes less than 2 seconds.     Findings: No rash.  Neurological:     Mental Status: She is alert and oriented to person, place, and time.     Gait: Gait is intact.  Psychiatric:        Attention and Perception: Attention normal.        Mood and Affect: Mood normal.        Speech: Speech normal.        Behavior: Behavior normal. Behavior is cooperative.        Thought Content: Thought content normal.        Judgment: Judgment normal.     Results for orders placed or performed in visit on 09/27/21  Vitamin D (25 hydroxy)  Result Value Ref Range   Vit D, 25-Hydroxy 18.2 (L) 30.0 - 100.0 ng/mL  CBC with Differential/Platelet  Result Value Ref Range   WBC 6.4 3.4 - 10.8 x10E3/uL   RBC 4.97 3.77 - 5.28 x10E6/uL   Hemoglobin 13.4 11.1 - 15.9 g/dL   Hematocrit 16.1 09.6 - 46.6 %   MCV 84 79 - 97 fL   MCH 27.0 26.6 - 33.0 pg   MCHC 32.1 31.5 - 35.7 g/dL   RDW 04.5 40.9 - 81.1 %   Platelets 344 150 - 450 x10E3/uL   Neutrophils 67 Not Estab. %   Lymphs 20 Not Estab. %  Monocytes 10 Not Estab. %   Eos 2 Not Estab. %   Basos 1 Not Estab. %   Neutrophils Absolute 4.3 1.4 - 7.0 x10E3/uL   Lymphocytes Absolute 1.3 0.7 - 3.1 x10E3/uL   Monocytes Absolute 0.6 0.1 - 0.9 x10E3/uL   EOS (ABSOLUTE) 0.2 0.0 - 0.4 x10E3/uL   Basophils Absolute 0.0 0.0 - 0.2 x10E3/uL   Immature Granulocytes 0 Not Estab. %   Immature Grans (Abs) 0.0 0.0 - 0.1 x10E3/uL  Comprehensive metabolic panel  Result Value Ref Range   Glucose 86 70 - 99 mg/dL   BUN 14 6 - 24 mg/dL   Creatinine, Ser 1.61 0.57 - 1.00 mg/dL   eGFR 096 >04 VW/UJW/1.19   BUN/Creatinine Ratio 20 9 - 23   Sodium 143 134 - 144 mmol/L   Potassium 4.7 3.5 - 5.2 mmol/L   Chloride 103 96 - 106 mmol/L    CO2 26 20 - 29 mmol/L   Calcium 10.3 (H) 8.7 - 10.2 mg/dL   Total Protein 7.6 6.0 - 8.5 g/dL   Albumin 4.4 3.8 - 4.9 g/dL   Globulin, Total 3.2 1.5 - 4.5 g/dL   Albumin/Globulin Ratio 1.4 1.2 - 2.2   Bilirubin Total 0.8 0.0 - 1.2 mg/dL   Alkaline Phosphatase 121 44 - 121 IU/L   AST 17 0 - 40 IU/L   ALT 14 0 - 32 IU/L  Lipid panel  Result Value Ref Range   Cholesterol, Total 204 (H) 100 - 199 mg/dL   Triglycerides 147 0 - 149 mg/dL   HDL 57 >82 mg/dL   VLDL Cholesterol Cal 19 5 - 40 mg/dL   LDL Chol Calc (NIH) 956 (H) 0 - 99 mg/dL   Chol/HDL Ratio 3.6 0.0 - 4.4 ratio  TSH  Result Value Ref Range   TSH 1.200 0.450 - 4.500 uIU/mL  Urinalysis, Routine w reflex microscopic  Result Value Ref Range   Specific Gravity, UA 1.025 1.005 - 1.030   pH, UA 5.5 5.0 - 7.5   Color, UA Yellow Yellow   Appearance Ur Cloudy (A) Clear   Leukocytes,UA Negative Negative   Protein,UA Negative Negative/Trace   Glucose, UA Negative Negative   Ketones, UA Negative Negative   RBC, UA Negative Negative   Bilirubin, UA Negative Negative   Urobilinogen, Ur 0.2 0.2 - 1.0 mg/dL   Nitrite, UA Negative Negative      Assessment & Plan:   Problem List Items Addressed This Visit       Cardiovascular and Mediastinum   Essential hypertension, benign    Chronic.  Not well controlled.  Patient has been out of medication.  Will restart Amlodipine, Irbesartan and HCTZ.  Labs ordered.  Follow up in 1 month.  Call sooner if concerns arise.       Relevant Medications   amLODipine (NORVASC) 10 MG tablet   irbesartan-hydrochlorothiazide (AVALIDE) 300-12.5 MG tablet   atorvastatin (LIPITOR) 20 MG tablet     Other   Hypercholesterolemia    Chronic.  Has been out of atorvastatin.  Will restart at visit today.  Labs ordered.  Vaccines up to date.       Relevant Medications   amLODipine (NORVASC) 10 MG tablet   irbesartan-hydrochlorothiazide (AVALIDE) 300-12.5 MG tablet   atorvastatin (LIPITOR) 20 MG tablet    Obesity (BMI 30-39.9)    Recommended eating smaller high protein, low fat meals more frequently and exercising 30 mins a day 5 times a week with a goal of 10-15lb weight  loss in the next 3 months.       Vitamin D deficiency    Labs ordered at visit today.  Will make recommendations based on lab results.        Other Visit Diagnoses     Annual physical exam    -  Primary   Health maintenance reviewed during visit today. Labs ordered.  PAP up to date.  Cologuard and Mammogram ordered.   Relevant Orders   TSH   Lipid panel   CBC with Differential/Platelet   Comprehensive metabolic panel   Urinalysis, Routine w reflex microscopic   Vitamin D (25 hydroxy)   HgB A1c   Screening for ischemic heart disease       Relevant Orders   MM 3D SCREENING MAMMOGRAM BILATERAL BREAST        Follow up plan: Return in about 1 month (around 07/20/2022) for BP Check.   LABORATORY TESTING:  - Pap smear: done elsewhere  IMMUNIZATIONS:   - Tdap: Tetanus vaccination status reviewed: last tetanus booster within 10 years. - Influenza: postponed till flu season - Pneumovax: not applicable - Prevnar: not applicable  - COVID: NA - HPV: NA - Shingrix vaccine: NA  SCREENING: -Mammogram: Ordered today - Colonoscopy: Cologuard order - Bone Density: NA -Hearing Test: NA -Spirometry: NA  PATIENT COUNSELING:   Advised to take 1 mg of folate supplement per day if capable of pregnancy.   Sexuality: Discussed sexually transmitted diseases, partner selection, use of condoms, avoidance of unintended pregnancy  and contraceptive alternatives.   Advised to avoid cigarette smoking.  I discussed with the patient that most people either abstain from alcohol or drink within safe limits (<=14/week and <=4 drinks/occasion for males, <=7/weeks and <= 3 drinks/occasion for females) and that the risk for alcohol disorders and other health effects rises proportionally with the number of drinks per week and how  often a drinker exceeds daily limits.  Discussed cessation/primary prevention of drug use and availability of treatment for abuse.   Diet: Encouraged to adjust caloric intake to maintain  or achieve ideal body weight, to reduce intake of dietary saturated fat and total fat, to limit sodium intake by avoiding high sodium foods and not adding table salt, and to maintain adequate dietary potassium and calcium preferably from fresh fruits, vegetables, and low-fat dairy products.    stressed the importance of regular exercise  Injury prevention: Discussed safety belts, safety helmets, smoke detector, smoking near bedding or upholstery.   Dental health: Discussed importance of regular tooth brushing, flossing, and dental visits.    NEXT PREVENTATIVE PHYSICAL DUE IN 1 YEAR. Return in about 1 month (around 07/20/2022) for BP Check.

## 2022-06-19 NOTE — Assessment & Plan Note (Signed)
Chronic.  Has been out of atorvastatin.  Will restart at visit today.  Labs ordered.  Vaccines up to date.

## 2022-06-20 ENCOUNTER — Telehealth: Payer: Self-pay

## 2022-06-20 LAB — COMPREHENSIVE METABOLIC PANEL
ALT: 19 IU/L (ref 0–32)
AST: 18 IU/L (ref 0–40)
Albumin/Globulin Ratio: 1.3 (ref 1.2–2.2)
Albumin: 4.3 g/dL (ref 3.8–4.9)
Alkaline Phosphatase: 127 IU/L — ABNORMAL HIGH (ref 44–121)
BUN/Creatinine Ratio: 18 (ref 9–23)
BUN: 12 mg/dL (ref 6–24)
Bilirubin Total: 0.6 mg/dL (ref 0.0–1.2)
CO2: 26 mmol/L (ref 20–29)
Calcium: 10.5 mg/dL — ABNORMAL HIGH (ref 8.7–10.2)
Chloride: 100 mmol/L (ref 96–106)
Creatinine, Ser: 0.68 mg/dL (ref 0.57–1.00)
Globulin, Total: 3.3 g/dL (ref 1.5–4.5)
Glucose: 112 mg/dL — ABNORMAL HIGH (ref 70–99)
Potassium: 4.1 mmol/L (ref 3.5–5.2)
Sodium: 141 mmol/L (ref 134–144)
Total Protein: 7.6 g/dL (ref 6.0–8.5)
eGFR: 103 mL/min/{1.73_m2} (ref >59)

## 2022-06-20 LAB — CBC WITH DIFFERENTIAL/PLATELET
Basophils Absolute: 0 10*3/uL (ref 0.0–0.2)
Basos: 0 %
EOS (ABSOLUTE): 0.1 10*3/uL (ref 0.0–0.4)
Eos: 2 %
Hematocrit: 45.8 % (ref 34.0–46.6)
Hemoglobin: 14.7 g/dL (ref 11.1–15.9)
Immature Grans (Abs): 0 10*3/uL (ref 0.0–0.1)
Immature Granulocytes: 0 %
Lymphocytes Absolute: 1.4 10*3/uL (ref 0.7–3.1)
Lymphs: 23 %
MCH: 26.5 pg — ABNORMAL LOW (ref 26.6–33.0)
MCHC: 32.1 g/dL (ref 31.5–35.7)
MCV: 83 fL (ref 79–97)
Monocytes Absolute: 0.4 10*3/uL (ref 0.1–0.9)
Monocytes: 7 %
Neutrophils Absolute: 4 10*3/uL (ref 1.4–7.0)
Neutrophils: 68 %
Platelets: 337 10*3/uL (ref 150–450)
RBC: 5.54 x10E6/uL — ABNORMAL HIGH (ref 3.77–5.28)
RDW: 12.7 % (ref 11.7–15.4)
WBC: 5.9 10*3/uL (ref 3.4–10.8)

## 2022-06-20 LAB — LIPID PANEL
Chol/HDL Ratio: 4.1 ratio (ref 0.0–4.4)
Cholesterol, Total: 238 mg/dL — ABNORMAL HIGH (ref 100–199)
HDL: 58 mg/dL (ref >39)
LDL Chol Calc (NIH): 162 mg/dL — ABNORMAL HIGH (ref 0–99)
Triglycerides: 100 mg/dL (ref 0–149)
VLDL Cholesterol Cal: 18 mg/dL (ref 5–40)

## 2022-06-20 LAB — HEMOGLOBIN A1C
Est. average glucose Bld gHb Est-mCnc: 111 mg/dL
Hgb A1c MFr Bld: 5.5 % (ref 4.8–5.6)

## 2022-06-20 LAB — VITAMIN D 25 HYDROXY (VIT D DEFICIENCY, FRACTURES): Vit D, 25-Hydroxy: 13.5 ng/mL — ABNORMAL LOW (ref 30.0–100.0)

## 2022-06-20 LAB — TSH: TSH: 0.819 u[IU]/mL (ref 0.450–4.500)

## 2022-06-20 NOTE — Telephone Encounter (Signed)
-----   Message from Larae Grooms, NP sent at 06/19/2022  1:51 PM EDT ----- Can we make her mammogram appt. She said Fridays.

## 2022-06-20 NOTE — Progress Notes (Signed)
Please let patietn know that her lab work shows that her cholesterol is elevated from prior.  Restarting the atorvastatin will help this.  Her A1c is in the normal range.  No indication of diabetes at this time.  Her vitamin D is still low, I have sent in vitamin D 50,000 IU once weekly for 12 weeks then she should take Vitamin D 3000 IU over the counter after completing the 12 weeks. No other concerns at this time.  Follow up as discussed.

## 2022-06-20 NOTE — Telephone Encounter (Signed)
Mammogram scheduled for 07/11/22 at 2:20 pm.   Called and notified patient of date, time, and location of appointment.

## 2022-07-11 ENCOUNTER — Other Ambulatory Visit: Payer: Self-pay | Admitting: Nurse Practitioner

## 2022-07-11 ENCOUNTER — Ambulatory Visit
Admission: RE | Admit: 2022-07-11 | Discharge: 2022-07-11 | Disposition: A | Discharge status: Home / Self Care | Payer: Commercial Managed Care - PPO | Source: Ambulatory Visit | Attending: Nurse Practitioner | Admitting: Nurse Practitioner

## 2022-07-11 DIAGNOSIS — Z136 Encounter for screening for cardiovascular disorders: Secondary | ICD-10-CM | POA: Insufficient documentation

## 2022-07-11 DIAGNOSIS — Z1231 Encounter for screening mammogram for malignant neoplasm of breast: Secondary | ICD-10-CM | POA: Insufficient documentation

## 2022-07-11 DIAGNOSIS — I10 Essential (primary) hypertension: Secondary | ICD-10-CM

## 2022-07-11 NOTE — Telephone Encounter (Signed)
Requested Prescriptions  Pending Prescriptions Disp Refills   amLODipine (NORVASC) 10 MG tablet [Pharmacy Med Name: AMLODIPINE BESYLATE 10 MG TAB] 90 tablet 1    Sig: TAKE 1 TABLET BY MOUTH EVERY DAY     Cardiovascular: Calcium Channel Blockers 2 Failed - 07/11/2022 11:30 AM      Failed - Last BP in normal range    BP Readings from Last 1 Encounters:  06/19/22 (!) 186/129         Passed - Last Heart Rate in normal range    Pulse Readings from Last 1 Encounters:  06/19/22 88         Passed - Valid encounter within last 6 months    Recent Outpatient Visits           3 weeks ago Annual physical exam   [place] [facility] Larae Grooms, NP   9 months ago Essential hypertension, benign   [place] [facility] Larae Grooms, NP   1 year ago Pre-op exam   [place] [facility] Larae Grooms, NP   1 year ago Essential hypertension, benign   [place] [facility] Larae Grooms, NP   1 year ago Hypercholesterolemia   [place] [facility] Larae Grooms, NP       Future Appointments             In 1 week Larae Grooms, NP  [facility]

## 2022-07-14 NOTE — Progress Notes (Signed)
Please let patient know her Mammogram did not show any evidence of a malignancy.  The recommendation is to repeat the Mammogram in 1 year.  

## 2022-07-21 ENCOUNTER — Ambulatory Visit (INDEPENDENT_AMBULATORY_CARE_PROVIDER_SITE_OTHER): Payer: Commercial Managed Care - PPO | Admitting: Nurse Practitioner

## 2022-07-21 ENCOUNTER — Encounter: Payer: Self-pay | Admitting: Nurse Practitioner

## 2022-07-21 VITALS — BP 144/91 | HR 71 | Temp 98.8°F | Wt 189.6 lb

## 2022-07-21 DIAGNOSIS — E559 Vitamin D deficiency, unspecified: Secondary | ICD-10-CM

## 2022-07-21 DIAGNOSIS — I10 Essential (primary) hypertension: Secondary | ICD-10-CM | POA: Diagnosis not present

## 2022-07-21 MED ORDER — HYDRALAZINE HCL 25 MG PO TABS: 25.0000 mg | ORAL_TABLET | Freq: Three times a day (TID) | ORAL | 1 refills | Status: DC

## 2022-07-21 MED ORDER — VITAMIN D (ERGOCALCIFEROL) 1.25 MG (50000 UNIT) PO CAPS
50000.0000 [IU] | ORAL_CAPSULE | ORAL | 1 refills | Status: DC
Start: 2022-07-21 — End: 2023-10-19

## 2022-07-21 NOTE — Progress Notes (Signed)
BP (!) 144/91   Pulse 71   Temp 98.8 F (37.1 C) (Oral)   Wt 189 lb 9.6 oz (86 kg)   SpO2 98%   BMI 33.59 kg/m    Subjective:    Patient ID: Rhonda Stanley, female    DOB: October 05, 1965, 57 y.o.   MRN: [id number]  HPI: Rhonda Stanley is a 57 y.o. female  Chief Complaint  Patient presents with   Hypertension    1 month f/up    HYPERTENSION without Chronic Kidney Disease Hypertension status: controlled  Satisfied with current treatment? no Duration of hypertension: years BP monitoring frequency:  not checking BP range:  BP medication side effects:  no Medication compliance: poor compliance Previous BP meds:amlodipine, HCTZ, and irbesartan (avapro) Aspirin: no Recurrent headaches: no Visual changes: no Palpitations: no Dyspnea: no Chest pain: no Lower extremity edema: no Dizzy/lightheaded: no   Relevant past medical, surgical, family and social history reviewed and updated as indicated. Interim medical history since our last visit reviewed. Allergies and medications reviewed and updated.  Review of Systems  Eyes:  Negative for visual disturbance.  Respiratory:  Negative for cough, chest tightness and shortness of breath.   Cardiovascular:  Negative for chest pain, palpitations and leg swelling.  Neurological:  Negative for dizziness and headaches.    Per HPI unless specifically indicated above     Objective:    BP (!) 144/91   Pulse 71   Temp 98.8 F (37.1 C) (Oral)   Wt 189 lb 9.6 oz (86 kg)   SpO2 98%   BMI 33.59 kg/m   Wt Readings from Last 3 Encounters:  07/21/22 189 lb 9.6 oz (86 kg)  06/19/22 185 lb 11.2 oz (84.2 kg)  09/27/21 189 lb 14.4 oz (86.1 kg)    Physical Exam Vitals and nursing note reviewed.  Constitutional:      General: She is not in acute distress.    Appearance: Normal appearance. She is not ill-appearing, toxic-appearing or diaphoretic.  HENT:     Head: Normocephalic.     Right Ear: External ear normal.     Left Ear:  External ear normal.     Nose: Nose normal.     Mouth/Throat:     Mouth: Mucous membranes are moist.     Pharynx: Oropharynx is clear.  Eyes:     General:        Right eye: No discharge.        Left eye: No discharge.     Extraocular Movements: Extraocular movements intact.     Conjunctiva/sclera: Conjunctivae normal.     Pupils: Pupils are equal, round, and reactive to light.  Cardiovascular:     Rate and Rhythm: Normal rate and regular rhythm.     Heart sounds: No murmur heard. Pulmonary:     Effort: Pulmonary effort is normal. No respiratory distress.     Breath sounds: Normal breath sounds. No wheezing or rales.  Musculoskeletal:     Cervical back: Normal range of motion and neck supple.  Skin:    General: Skin is warm and dry.     Capillary Refill: Capillary refill takes less than 2 seconds.  Neurological:     General: No focal deficit present.     Mental Status: She is alert and oriented to person, place, and time. Mental status is at baseline.  Psychiatric:        Mood and Affect: Mood normal.        Behavior: Behavior  normal.        Thought Content: Thought content normal.        Judgment: Judgment normal.     Results for orders placed or performed in visit on 06/19/22  TSH  Result Value Ref Range   TSH 0.819 0.450 - 4.500 uIU/mL  Lipid panel  Result Value Ref Range   Cholesterol, Total 238 (H) 100 - 199 mg/dL   Triglycerides 147 0 - 149 mg/dL   HDL 58 >82 mg/dL   VLDL Cholesterol Cal 18 5 - 40 mg/dL   LDL Chol Calc (NIH) 956 (H) 0 - 99 mg/dL   Chol/HDL Ratio 4.1 0.0 - 4.4 ratio  CBC with Differential/Platelet  Result Value Ref Range   WBC 5.9 3.4 - 10.8 x10E3/uL   RBC 5.54 (H) 3.77 - 5.28 x10E6/uL   Hemoglobin 14.7 11.1 - 15.9 g/dL   Hematocrit 21.3 08.6 - 46.6 %   MCV 83 79 - 97 fL   MCH 26.5 (L) 26.6 - 33.0 pg   MCHC 32.1 31.5 - 35.7 g/dL   RDW 57.8 46.9 - 62.9 %   Platelets 337 150 - 450 x10E3/uL   Neutrophils 68 Not Estab. %   Lymphs 23 Not  Estab. %   Monocytes 7 Not Estab. %   Eos 2 Not Estab. %   Basos 0 Not Estab. %   Neutrophils Absolute 4.0 1.4 - 7.0 x10E3/uL   Lymphocytes Absolute 1.4 0.7 - 3.1 x10E3/uL   Monocytes Absolute 0.4 0.1 - 0.9 x10E3/uL   EOS (ABSOLUTE) 0.1 0.0 - 0.4 x10E3/uL   Basophils Absolute 0.0 0.0 - 0.2 x10E3/uL   Immature Granulocytes 0 Not Estab. %   Immature Grans (Abs) 0.0 0.0 - 0.1 x10E3/uL  Comprehensive metabolic panel  Result Value Ref Range   Glucose 112 (H) 70 - 99 mg/dL   BUN 12 6 - 24 mg/dL   Creatinine, Ser 5.28 0.57 - 1.00 mg/dL   eGFR 413 >24 MW/NUU/7.25   BUN/Creatinine Ratio 18 9 - 23   Sodium 141 134 - 144 mmol/L   Potassium 4.1 3.5 - 5.2 mmol/L   Chloride 100 96 - 106 mmol/L   CO2 26 20 - 29 mmol/L   Calcium 10.5 (H) 8.7 - 10.2 mg/dL   Total Protein 7.6 6.0 - 8.5 g/dL   Albumin 4.3 3.8 - 4.9 g/dL   Globulin, Total 3.3 1.5 - 4.5 g/dL   Albumin/Globulin Ratio 1.3 1.2 - 2.2   Bilirubin Total 0.6 0.0 - 1.2 mg/dL   Alkaline Phosphatase 127 (H) 44 - 121 IU/L   AST 18 0 - 40 IU/L   ALT 19 0 - 32 IU/L  Urinalysis, Routine w reflex microscopic  Result Value Ref Range   Specific Gravity, UA 1.020 1.005 - 1.030   pH, UA 5.0 5.0 - 7.5   Color, UA Yellow Yellow   Appearance Ur Cloudy (A) Clear   Leukocytes,UA Negative Negative   Protein,UA Trace (A) Negative/Trace   Glucose, UA Negative Negative   Ketones, UA Negative Negative   RBC, UA Negative Negative   Bilirubin, UA Negative Negative   Urobilinogen, Ur 1.0 0.2 - 1.0 mg/dL   Nitrite, UA Negative Negative   Microscopic Examination Comment   Vitamin D (25 hydroxy)  Result Value Ref Range   Vit D, 25-Hydroxy 13.5 (L) 30.0 - 100.0 ng/mL  HgB A1c  Result Value Ref Range   Hgb A1c MFr Bld 5.5 4.8 - 5.6 %   Est. average glucose Bld gHb  Est-mCnc 111 mg/dL      Assessment & Plan:   Problem List Items Addressed This Visit       Cardiovascular and Mediastinum   Essential hypertension, benign - Primary    Chronic.   Improved but not under control.  Will add back Hydralazine 25mg  TID.  Continue with Amlodipine and Avapro.  Follow up in 2 months.  Call sooner if concerns arise.        Relevant Medications   hydrALAZINE (APRESOLINE) 25 MG tablet     Other   Vitamin D deficiency    Refilled at visit today.  Last labs show vitamin D was 13.       Relevant Medications   Vitamin D, Ergocalciferol, (DRISDOL) 1.25 MG (50000 UNIT) CAPS capsule     Follow up plan: Return in about 2 months (around 09/20/2022) for BP Check.

## 2022-07-21 NOTE — Assessment & Plan Note (Signed)
Refilled at visit today.  Last labs show vitamin D was 13.

## 2022-07-21 NOTE — Assessment & Plan Note (Signed)
Chronic.  Improved but not under control.  Will add back Hydralazine 25mg  TID.  Continue with Amlodipine and Avapro.  Follow up in 2 months.  Call sooner if concerns arise.

## 2022-09-22 ENCOUNTER — Ambulatory Visit: Payer: Commercial Managed Care - PPO | Admitting: Physician Assistant

## 2022-09-22 ENCOUNTER — Encounter: Payer: Self-pay | Admitting: *Deleted

## 2022-09-27 ENCOUNTER — Other Ambulatory Visit: Payer: Self-pay | Admitting: Nurse Practitioner

## 2022-09-27 DIAGNOSIS — I10 Essential (primary) hypertension: Secondary | ICD-10-CM

## 2022-09-27 DIAGNOSIS — E7800 Pure hypercholesterolemia, unspecified: Secondary | ICD-10-CM

## 2022-09-30 NOTE — Telephone Encounter (Signed)
Requested Prescriptions  Refused Prescriptions Disp Refills   atorvastatin (LIPITOR) 20 MG tablet [Pharmacy Med Name: ATORVASTATIN 20 MG TABLET] 90 tablet 1    Sig: TAKE 1 TABLET BY MOUTH EVERY DAY     Cardiovascular:  Antilipid - Statins Failed - 09/27/2022  2:06 PM      Failed - Lipid Panel in normal range within the last 12 months    Cholesterol, Total  Date Value Ref Range Status  06/19/2022 238 (H) 100 - 199 mg/dL Final   Cholesterol Piccolo, Waived  Date Value Ref Range Status  10/13/2014 170 <200 mg/dL Final    Comment:                            Desirable                <200                         Borderline High      200- 239                         High                     >239    LDL Chol Calc (NIH)  Date Value Ref Range Status  06/19/2022 162 (H) 0 - 99 mg/dL Final   HDL  Date Value Ref Range Status  06/19/2022 58 >39 mg/dL Final   Triglycerides  Date Value Ref Range Status  06/19/2022 100 0 - 149 mg/dL Final   Triglycerides Piccolo,Waived  Date Value Ref Range Status  10/13/2014 81 <150 mg/dL Final    Comment:                            Normal                   <150                         Borderline High     150 - 199                         High                200 - 499                         Very High                >499          Passed - Patient is not pregnant      Passed - Valid encounter within last 12 months    Recent Outpatient Visits           2 months ago Essential hypertension, benign   [place] [facility]ae Grooms, [facility]   3 months ago Annual physical exam   [place] [facility] Larae Grooms, NP   1 year ago Essential hypertension, benign   [place] [facility] Larae Grooms, NP   1 year ago Pre-op exam   [place] [facility] Larae Grooms, NP   1 year ago Essential hypertension, benign   [place] [facility] Anton, Clydie Braun,  NP                irbesartan-hydrochlorothiazide (AVALIDE) 300-12.5 MG tablet [Pharmacy Med Name: IRBESARTAN-HCTZ 300-12.5 MG TB] 90 tablet 1    Sig: TAKE 1 TABLET BY MOUTH EVERY DAY     Cardiovascular: ARB + Diuretic Combos Failed - 09/27/2022  2:06 PM      Failed - Last BP in normal range    BP Readings from Last 1 Encounters:  07/21/22 (!) 144/91         Passed - K in normal range and within 180 days    Potassium  Date Value Ref Range Status  06/19/2022 4.1 3.5 - 5.2 mmol/L Final         Passed - Na in normal range and within 180 days    Sodium  Date Value Ref Range Status  06/19/2022 141 134 - 144 mmol/L Final         Passed - Cr in normal range and within 180 days    Creatinine, Ser  Date Value Ref Range Status  06/19/2022 0.68 0.57 - 1.00 mg/dL Final         Passed - eGFR is 10 or above and within 180 days    GFR calc Af Amer  Date Value Ref Range Status  03/05/2020 117 >59 mL/min/1.73 Final    Comment:    **In accordance with recommendations from the NKF-ASN Task force,**   [facility] is in the process of updating its eGFR calculation to the   2021 CKD-EPI creatinine equation that estimates kidney function   without a race variable.    GFR, Estimated  Date Value Ref Range Status  01/17/2021 >60 >60 mL/min Final    Comment:    (NOTE) Calculated using the CKD-EPI Creatinine Equation (2021)    eGFR  Date Value Ref Range Status  06/19/2022 103 >59 mL/min/1.73 Final         Passed - Patient is not pregnant      Passed - Valid encounter within last 6 months    Recent Outpatient Visits           2 months ago Essential hypertension, benign   [place] [facility]ae [facility], NP   3 months ago Annual physical exam   [place] [facility] Larae Grooms, NP   1 year ago Essential hypertension, benign   [place] [facility] Larae Grooms, NP   1 year ago Pre-op exam   [place] [facility] Larae Grooms, NP   1 year ago Essential hypertension, benign   [place] [facility] Larae Grooms, NP

## 2023-01-09 ENCOUNTER — Other Ambulatory Visit: Payer: Self-pay | Admitting: Nurse Practitioner

## 2023-01-09 DIAGNOSIS — I10 Essential (primary) hypertension: Secondary | ICD-10-CM

## 2023-01-09 DIAGNOSIS — E7800 Pure hypercholesterolemia, unspecified: Secondary | ICD-10-CM

## 2023-01-12 NOTE — Telephone Encounter (Signed)
Requested Prescriptions  Pending Prescriptions Disp Refills   atorvastatin (LIPITOR) 20 MG tablet [Pharmacy Med Name: ATORVASTATIN 20 MG TABLET] 90 tablet 1    Sig: TAKE 1 TABLET BY MOUTH EVERY DAY     Cardiovascular:  Antilipid - Statins Failed - 01/09/2023 11:15 AM      Failed - Lipid Panel in normal range within the last 12 months    Cholesterol, Total  Date Value Ref Range Status  06/19/2022 238 (H) 100 - 199 mg/dL Final   Cholesterol Piccolo, Waived  Date Value Ref Range Status  10/13/2014 170 <200 mg/dL Final    Comment:                            Desirable                <200                         Borderline High      200- 239                         High                     >239    LDL Chol Calc (NIH)  Date Value Ref Range Status  06/19/2022 162 (H) 0 - 99 mg/dL Final   HDL  Date Value Ref Range Status  06/19/2022 58 >39 mg/dL Final   Triglycerides  Date Value Ref Range Status  06/19/2022 100 0 - 149 mg/dL Final   Triglycerides Piccolo,Waived  Date Value Ref Range Status  10/13/2014 81 <150 mg/dL Final    Comment:                            Normal                   <150                         Borderline High     150 - 199                         High                200 - 499                         Very High                >499          Passed - Patient is not pregnant      Passed - Valid encounter within last 12 months    Recent Outpatient Visits           5 months ago Essential hypertension, benign   [place] [facility] Larae Grooms, NP   6 months ago Annual physical exam   [place] [facility] Larae Grooms, NP   1 year ago Essential hypertension, benign   [place] [facility] Larae Grooms, NP   2 years ago Pre-op exam   [place] [facility] Larae Grooms, NP   2 years ago Essential hypertension, benign   [place] [facility] Larae Grooms, NP  irbesartan-hydrochlorothiazide (AVALIDE) 300-12.5 MG tablet [Pharmacy Med Name: IRBESARTAN-HCTZ 300-12.5 MG TB] 90 tablet 0    Sig: TAKE 1 TABLET BY MOUTH EVERY DAY     Cardiovascular: ARB + Diuretic Combos Failed - 01/09/2023 11:15 AM      Failed - K in normal range and within 180 days    Potassium  Date Value Ref Range Status  06/19/2022 4.1 3.5 - 5.2 mmol/L Final         Failed - Na in normal range and within 180 days    Sodium  Date Value Ref Range Status  06/19/2022 141 134 - 144 mmol/L Final         Failed - Cr in normal range and within 180 days    Creatinine, Ser  Date Value Ref Range Status  06/19/2022 0.68 0.57 - 1.00 mg/dL Final         Failed - eGFR is 10 or above and within 180 days    GFR calc Af Amer  Date Value Ref Range Status  03/05/2020 117 >59 mL/min/1.73 Final    Comment:    **In accordance with recommendations from the NKF-ASN Task force,**   [facility] is in the process of updating its eGFR calculation to the   2021 CKD-EPI creatinine equation that estimates kidney function   without a race variable.    GFR, Estimated  Date Value Ref Range Status  01/17/2021 >60 >60 mL/min Final    Comment:    (NOTE) Calculated using the CKD-EPI Creatinine Equation (2021)    eGFR  Date Value Ref Range Status  06/19/2022 103 >59 mL/min/1.73 Final         Failed - Last BP in normal range    BP Readings from Last 1 Encounters:  07/21/22 (!) 144/91         Passed - Patient is not pregnant      Passed - Valid encounter within last 6 months    Recent Outpatient Visits           5 months ago Essential hypertension, benign   [place] [facility] Larae Grooms, NP   6 months ago Annual physical exam   [place] [facility] Larae Grooms, NP   1 year ago Essential hypertension, benign   [place] [facility] Larae Grooms, NP   2 years ago Pre-op exam   [place] [facility] Larae Grooms, NP   2 years ago Essential hypertension, benign   [place] [facility] Larae Grooms, NP               amLODipine (NORVASC) 10 MG tablet [Pharmacy Med Name: AMLODIPINE BESYLATE 10 MG TAB] 90 tablet 0    Sig: TAKE 1 TABLET BY MOUTH EVERY DAY     Cardiovascular: Calcium Channel Blockers 2 Failed - 01/09/2023 11:15 AM      Failed - Last BP in normal range    BP Readings from Last 1 Encounters:  07/21/22 (!) 144/91         Passed - Last Heart Rate in normal range    Pulse Readings from Last 1 Encounters:  07/21/22 71         Passed - Valid encounter within last 6 months    Recent Outpatient Visits           5 months ago Essential hypertension, benign   [place] [facility] Larae Grooms, NP   6 months ago Annual physical  exam   [place] [facility] Larae Grooms, NP   1 year ago Essential hypertension, benign   [place] [facility] Larae Grooms, NP   2 years ago Pre-op exam   [place] [facility] Larae Grooms, NP   2 years ago Essential hypertension, benign   [place] [facility] Larae Grooms, NP               hydrALAZINE (APRESOLINE) 25 MG tablet [Pharmacy Med Name: HYDRALAZINE 25 MG TABLET] 270 tablet 1    Sig: TAKE 1 TABLET BY MOUTH THREE TIMES A DAY     Cardiovascular:  Vasodilators Failed - 01/09/2023 11:15 AM      Failed - RBC in normal range and within 360 days    RBC  Date Value Ref Range Status  06/19/2022 5.54 (H) 3.77 - 5.28 x10E6/uL Final  01/17/2021 4.18 3.87 - 5.11 MIL/uL Final         Failed - ANA Screen, Ifa, Serum in normal range and within 360 days    No results found for: "ANA", "ANATITER", "LABANTI"       Failed - Last BP in normal range    BP Readings from Last 1 Encounters:  07/21/22 (!) 144/91         Passed - HCT in normal range and within 360 days    Hematocrit  Date Value Ref Range  Status  06/19/2022 45.8 34.0 - 46.6 % Final         Passed - HGB in normal range and within 360 days    Hemoglobin  Date Value Ref Range Status  06/19/2022 14.7 11.1 - 15.9 g/dL Final         Passed - WBC in normal range and within 360 days    WBC  Date Value Ref Range Status  06/19/2022 5.9 3.4 - 10.8 x10E3/uL Final  01/17/2021 12.4 (H) 4.0 - 10.5 K/uL Final         Passed - PLT in normal range and within 360 days    Platelets  Date Value Ref Range Status  06/19/2022 337 150 - 450 x10E3/uL Final         Passed - Valid encounter within last 12 months    Recent Outpatient Visits           5 months ago Essential hypertension, benign   [place] [facility] Larae Grooms, NP   6 months ago Annual physical exam   [place] [facility] Larae Grooms, NP   1 year ago Essential hypertension, benign   [place] [facility] Larae Grooms, NP   2 years ago Pre-op exam   [place] [facility] Larae Grooms, NP   2 years ago Essential hypertension, benign   [place] [facility] Larae Grooms, NP

## 2023-01-12 NOTE — Telephone Encounter (Signed)
Requested medications are due for refill today.  yes  Requested medications are on the active medications list.  yes  Last refill. 07/21/2022 #270 1 rf  Future visit scheduled.   no  Notes to clinic.  Missing labs    Requested Prescriptions  Pending Prescriptions Disp Refills   hydrALAZINE (APRESOLINE) 25 MG tablet [Pharmacy Med Name: HYDRALAZINE 25 MG TABLET] 270 tablet 1    Sig: TAKE 1 TABLET BY MOUTH THREE TIMES A DAY     Cardiovascular:  Vasodilators Failed - 01/09/2023 11:15 AM      Failed - RBC in normal range and within 360 days    RBC  Date Value Ref Range Status  06/19/2022 5.54 (H) 3.77 - 5.28 x10E6/uL Final  01/17/2021 4.18 3.87 - 5.11 MIL/uL Final         Failed - ANA Screen, Ifa, Serum in normal range and within 360 days    No results found for: "ANA", "ANATITER", "LABANTI"       Failed - Last BP in normal range    BP Readings from Last 1 Encounters:  07/21/22 (!) 144/91         Passed - HCT in normal range and within 360 days    Hematocrit  Date Value Ref Range Status  06/19/2022 45.8 34.0 - 46.6 % Final         Passed - HGB in normal range and within 360 days    Hemoglobin  Date Value Ref Range Status  06/19/2022 14.7 11.1 - 15.9 g/dL Final         Passed - WBC in normal range and within 360 days    WBC  Date Value Ref Range Status  06/19/2022 5.9 3.4 - 10.8 x10E3/uL Final  01/17/2021 12.4 (H) 4.0 - 10.5 K/uL Final         Passed - PLT in normal range and within 360 days    Platelets  Date Value Ref Range Status  06/19/2022 337 150 - 450 x10E3/uL Final         Passed - Valid encounter within last 12 months    Recent Outpatient Visits           5 months ago Essential hypertension, benign   [place] [facility] Larae Grooms, NP   6 months ago Annual physical exam   [place] [facility] Larae Grooms, NP   1 year ago Essential hypertension, benign   [place] [facility] Larae Grooms, NP   2 years ago Pre-op exam   [place] [facility]   2 years ago Essential hypertension, benign   [place] [facility] Larae Grooms, NP              Signed Prescriptions Disp Refills   atorvastatin (LIPITOR) 20 MG tablet 90 tablet 1    Sig: TAKE 1 TABLET BY MOUTH EVERY DAY     Cardiovascular:  Antilipid - Statins Failed - 01/09/2023 11:15 AM      Failed - Lipid Panel in normal range within the last 12 months    Cholesterol, Total  Date Value Ref Range Status  06/19/2022 238 (H) 100 - 199 mg/dL Final   Cholesterol Piccolo, Waived  Date Value Ref Range Status  10/13/2014 170 <200 mg/dL Final    Comment:  Desirable                <200                         Borderline High      200- 239                         High                     >239    LDL Chol Calc (NIH)  Date Value Ref Range Status  06/19/2022 162 (H) 0 - 99 mg/dL Final   HDL  Date Value Ref Range Status  06/19/2022 58 >39 mg/dL Final   Triglycerides  Date Value Ref Range Status  06/19/2022 100 0 - 149 mg/dL Final   Triglycerides Piccolo,Waived  Date Value Ref Range Status  10/13/2014 81 <150 mg/dL Final    Comment:                            Normal                   <150                         Borderline High     150 - 199                         High                200 - 499                         Very High                >499          Passed - Patient is not pregnant      Passed - Valid encounter within last 12 months    Recent Outpatient Visits           5 months ago Essential hypertension, benign   [place] [facility] [place], Clydie Braun, NP   6 months ago Annual physical exam   [place] [facility] Larae Grooms, NP   1 year ago Essential hypertension, benign   [place] [facility] Larae Grooms, NP   2 years ago Pre-op exam   [place]  [facility]ae [facility]   2 years ago Essential hypertension, benign   [place] [facility] Larae Grooms, NP               irbesartan-hydrochlorothiazide (AVALIDE) 300-12.5 MG tablet 90 tablet 0    Sig: TAKE 1 TABLET BY MOUTH EVERY DAY     Cardiovascular: ARB + Diuretic Combos Failed - 01/09/2023 11:15 AM      Failed - K in normal range and within 180 days    Potassium  Date Value Ref Range Status  06/19/2022 4.1 3.5 - 5.2 mmol/L Final         Failed - Na in normal range and within 180 days    Sodium  Date Value Ref Range Status  06/19/2022 141 134 - 144 mmol/L Final         Failed - Cr in normal range and within 180  days    Creatinine, Ser  Date Value Ref Range Status  06/19/2022 0.68 0.57 - 1.00 mg/dL Final         Failed - eGFR is 10 or above and within 180 days    GFR calc Af Amer  Date Value Ref Range Status  03/05/2020 117 >59 mL/min/1.73 Final    Comment:    **In accordance with recommendations from the NKF-ASN Task force,**   [facility] is in the process of updating its eGFR calculation to the   2021 CKD-EPI creatinine equation that estimates kidney function   without a race variable.    GFR, Estimated  Date Value Ref Range Status  01/17/2021 >60 >60 mL/min Final    Comment:    (NOTE) Calculated using the CKD-EPI Creatinine Equation (2021)    eGFR  Date Value Ref Range Status  06/19/2022 103 >59 mL/min/1.73 Final         Failed - Last BP in normal range    BP Readings from Last 1 Encounters:  07/21/22 (!) 144/91         Passed - Patient is not pregnant      Passed - Valid encounter within last 6 months    Recent Outpatient Visits           5 months ago Essential hypertension, benign   [place] [facility] Larae Grooms, NP   6 months ago Annual physical exam   [place] [facility] Lar[facility] G[facility], NP   1 year ago Essential hypertension, benign   [place]  [facility] Larae Grooms, NP   2 years ago Pre-op exam   [place] [facility] Larae Grooms, NP   2 years ago Essential hypertension, benign   [place] [facility] Larae Grooms, NP               amLODipine (NORVASC) 10 MG tablet 90 tablet 0    Sig: TAKE 1 TABLET BY MOUTH EVERY DAY     Cardiovascular: Calcium Channel Blockers 2 Failed - 01/09/2023 11:15 AM      Failed - Last BP in normal range    BP Readings from Last 1 Encounters:  07/21/22 (!) 144/91         Passed - Last Heart Rate in normal range    Pulse Readings from Last 1 Encounters:  07/21/22 71         Passed - Valid encounter within last 6 months    Recent Outpatient Visits           5 months ago Essential hypertension, benign   [place] [facility] Larae Grooms, NP   6 months ago Annual physical exam   [place] [facility] Larae Grooms, NP   1 year ago Essential hypertension, benign   [place] [facility] Larae Grooms, NP   2 years ago Pre-op exam   [place] [facility] Larae Grooms, NP   2 years ago Essential hypertension, benign   [place] [facility] Larae Grooms, NP

## 2023-04-09 ENCOUNTER — Other Ambulatory Visit: Payer: Self-pay | Admitting: Nurse Practitioner

## 2023-04-09 DIAGNOSIS — I10 Essential (primary) hypertension: Secondary | ICD-10-CM

## 2023-04-09 NOTE — Telephone Encounter (Signed)
 OV needed for additional refills, 30 day supply given until OV can be made.  Requested Prescriptions  Pending Prescriptions Disp Refills   irbesartan-hydrochlorothiazide (AVALIDE) 300-12.5 MG tablet [Pharmacy Med Name: IRBESARTAN-HCTZ 300-12.5 MG TB] 30 tablet 0    Sig: TAKE 1 TABLET BY MOUTH EVERY DAY     Cardiovascular: ARB + Diuretic Combos Failed - 04/09/2023  3:59 PM      Failed - K in normal range and within 180 days    Potassium  Date Value Ref Range Status  06/19/2022 4.1 3.5 - 5.2 mmol/L Final         Failed - Na in normal range and within 180 days    Sodium  Date Value Ref Range Status  06/19/2022 141 134 - 144 mmol/L Final         Failed - Cr in normal range and within 180 days    Creatinine, Ser  Date Value Ref Range Status  06/19/2022 0.68 0.57 - 1.00 mg/dL Final         Failed - eGFR is 10 or above and within 180 days    GFR calc Af Amer  Date Value Ref Range Status  03/05/2020 117 >59 mL/min/1.73 Final    Comment:    **In accordance with recommendations from the NKF-ASN Task force,**   [facility] is in the process of updating its eGFR calculation to the   2021 CKD-EPI creatinine equation that estimates kidney function   without a race variable.    GFR, Estimated  Date Value Ref Range Status  01/17/2021 >60 >60 mL/min Final    Comment:    (NOTE) Calculated using the CKD-EPI Creatinine Equation (2021)    eGFR  Date Value Ref Range Status  06/19/2022 103 >59 mL/min/1.73 Final         Failed - Last BP in normal range    BP Readings from Last 1 Encounters:  07/21/22 (!) 144/91         Failed - Valid encounter within last 6 months    Recent Outpatient Visits           8 months ago Essential hypertension, benign   [place] [facility] Larae Grooms, NP   9 months ago Annual physical exam   [place] [facility] Larae Grooms, NP   1 year ago Essential hypertension, benign   [place] [facility]  Larae Grooms, NP   2 years ago Pre-op exam   [place] [facility] Larae Grooms, NP   2 years ago Essential hypertension, benign   [place] [facility] Larae Grooms, NP              Passed - Patient is not pregnant       amLODipine (NORVASC) 10 MG tablet [Pharmacy Med Name: AMLODIPINE BESYLATE 10 MG TAB] 30 tablet 0    Sig: TAKE 1 TABLET BY MOUTH EVERY DAY     Cardiovascular: Calcium Channel Blockers 2 Failed - 04/09/2023  3:59 PM      Failed - Last BP in normal range    BP Readings from Last 1 Encounters:  07/21/22 (!) 144/91         Failed - Valid encounter within last 6 months    Recent Outpatient Visits           8 months ago Essential hypertension, benign    [facility], [facility]   9 months ago Annual physical exam  [place] [facility] Larae G[facility], NP   1 year ago Essential hypertension, benign   [place] [facility] Larae Grooms, NP   2 years ago Pre-op exam   [place] [facility] Larae Grooms, NP   2 years ago Essential hypertension, benign   [place] [facility] Larae Grooms, NP              Passed - Last Heart Rate in normal range    Pulse Readings from Last 1 Encounters:  07/21/22 71

## 2023-05-04 ENCOUNTER — Other Ambulatory Visit: Payer: Self-pay | Admitting: Nurse Practitioner

## 2023-05-04 DIAGNOSIS — I10 Essential (primary) hypertension: Secondary | ICD-10-CM

## 2023-05-05 ENCOUNTER — Other Ambulatory Visit: Payer: Self-pay | Admitting: Nurse Practitioner

## 2023-05-05 DIAGNOSIS — I10 Essential (primary) hypertension: Secondary | ICD-10-CM

## 2023-05-05 NOTE — Telephone Encounter (Signed)
 Requested Prescriptions  Pending Prescriptions Disp Refills   amLODipine (NORVASC) 10 MG tablet [Pharmacy Med Name: AMLODIPINE BESYLATE 10 MG TAB] 90 tablet 1    Sig: TAKE 1 TABLET BY MOUTH EVERY DAY     Cardiovascular: Calcium Channel Blockers 2 Failed - 05/05/2023  4:44 PM      Failed - Last BP in normal range    BP Readings from Last 1 Encounters:  07/21/22 (!) 144/91         Failed - Valid encounter within last 6 months    Recent Outpatient Visits   None            Passed - Last Heart Rate in normal range    Pulse Readings from Last 1 Encounters:  07/21/22 71

## 2023-05-07 NOTE — Telephone Encounter (Signed)
 Requested medication (s) are due for refill today- yes  Requested medication (s) are on the active medication list -yes  Future visit scheduled -yes  Last refill: 04/09/22 #30  Notes to clinic: OV 07/21/22, courtesy RF 04/09/22- patient does have appointment scheduled 05/11/23- do you want to give another courtesy refill?- Requesting 90 day Rx also  Requested Prescriptions  Pending Prescriptions Disp Refills   irbesartan-hydrochlorothiazide (AVALIDE) 300-12.5 MG tablet [Pharmacy Med Name: IRBESARTAN-HCTZ 300-12.5 MG TB] 90 tablet 1    Sig: TAKE 1 TABLET BY MOUTH EVERY DAY     Cardiovascular: ARB + Diuretic Combos Failed - 05/07/2023 12:53 PM      Failed - K in normal range and within 180 days    Potassium  Date Value Ref Range Status  06/19/2022 4.1 3.5 - 5.2 mmol/L Final         Failed - Na in normal range and within 180 days    Sodium  Date Value Ref Range Status  06/19/2022 141 134 - 144 mmol/L Final         Failed - Cr in normal range and within 180 days    Creatinine, Ser  Date Value Ref Range Status  06/19/2022 0.68 0.57 - 1.00 mg/dL Final         Failed - eGFR is 10 or above and within 180 days    GFR calc Af Amer  Date Value Ref Range Status  03/05/2020 117 >59 mL/min/1.73 Final    Comment:    **In accordance with recommendations from the NKF-ASN Task force,**   [facility] is in the process of updating its eGFR calculation to the   2021 CKD-EPI creatinine equation that estimates kidney function   without a race variable.    GFR, Estimated  Date Value Ref Range Status  01/17/2021 >60 >60 mL/min Final    Comment:    (NOTE) Calculated using the CKD-EPI Creatinine Equation (2021)    eGFR  Date Value Ref Range Status  06/19/2022 103 >59 mL/min/1.73 Final         Failed - Last BP in normal range    BP Readings from Last 1 Encounters:  07/21/22 (!) 144/91         Failed - Valid encounter within last 6 months    Recent Outpatient Visits   None             Passed - Patient is not pregnant         Requested Prescriptions  Pending Prescriptions Disp Refills   irbesartan-hydrochlorothiazide (AVALIDE) 300-12.5 MG tablet [Pharmacy Med Name: IRBESARTAN-HCTZ 300-12.5 MG TB] 90 tablet 1    Sig: TAKE 1 TABLET BY MOUTH EVERY DAY     Cardiovascular: ARB + Diuretic Combos Failed - 05/07/2023 12:53 PM      Failed - K in normal range and within 180 days    Potassium  Date Value Ref Range Status  06/19/2022 4.1 3.5 - 5.2 mmol/L Final         Failed - Na in normal range and within 180 days    Sodium  Date Value Ref Range Status  06/19/2022 141 134 - 144 mmol/L Final         Failed - Cr in normal range and within 180 days    Creatinine, Ser  Date Value Ref Range Status  06/19/2022 0.68 0.57 - 1.00 mg/dL Final         Failed - eGFR is 10 or above and within 180 days  GFR calc Af Amer  Date Value Ref Range Status  03/05/2020 117 >59 mL/min/1.73 Final    Comment:    **In accordance with recommendations from the NKF-ASN Task force,**   [facility] is in the process of updating its eGFR calculation to the   2021 CKD-EPI creatinine equation that estimates kidney function   without a race variable.    GFR, Estimated  Date Value Ref Range Status  01/17/2021 >60 >60 mL/min Final    Comment:    (NOTE) Calculated using the CKD-EPI Creatinine Equation (2021)    eGFR  Date Value Ref Range Status  06/19/2022 103 >59 mL/min/1.73 Final         Failed - Last BP in normal range    BP Readings from Last 1 Encounters:  07/21/22 (!) 144/91         Failed - Valid encounter within last 6 months    Recent Outpatient Visits   None            Passed - Patient is not pregnant

## 2023-05-09 ENCOUNTER — Other Ambulatory Visit: Payer: Self-pay | Admitting: Nurse Practitioner

## 2023-05-09 DIAGNOSIS — I10 Essential (primary) hypertension: Secondary | ICD-10-CM

## 2023-05-11 ENCOUNTER — Ambulatory Visit: Admitting: Nurse Practitioner

## 2023-05-11 NOTE — Telephone Encounter (Signed)
 Requested medications are due for refill today.  yes  Requested medications are on the active medications list.  yes  Last refill. 04/09/2023 #30 0 rf  Future visit scheduled.   no  Notes to clinic.  Pt already given a courtesy refill.     Requested Prescriptions  Pending Prescriptions Disp Refills   irbesartan-hydrochlorothiazide (AVALIDE) 300-12.5 MG tablet [Pharmacy Med Name: IRBESARTAN-HCTZ 300-12.5 MG TB] 30 tablet 0    Sig: TAKE 1 TABLET BY MOUTH EVERY DAY     Cardiovascular: ARB + Diuretic Combos Failed - 05/11/2023  1:36 PM      Failed - K in normal range and within 180 days    Potassium  Date Value Ref Range Status  06/19/2022 4.1 3.5 - 5.2 mmol/L Final         Failed - Na in normal range and within 180 days    Sodium  Date Value Ref Range Status  06/19/2022 141 134 - 144 mmol/L Final         Failed - Cr in normal range and within 180 days    Creatinine, Ser  Date Value Ref Range Status  06/19/2022 0.68 0.57 - 1.00 mg/dL Final         Failed - eGFR is 10 or above and within 180 days    GFR calc Af Amer  Date Value Ref Range Status  03/05/2020 117 >59 mL/min/1.73 Final    Comment:    **In accordance with recommendations from the NKF-ASN Task force,**   [facility] is in the process of updating its eGFR calculation to the   2021 CKD-EPI creatinine equation that estimates kidney function   without a race variable.    GFR, Estimated  Date Value Ref Range Status  01/17/2021 >60 >60 mL/min Final    Comment:    (NOTE) Calculated using the CKD-EPI Creatinine Equation (2021)    eGFR  Date Value Ref Range Status  06/19/2022 103 >59 mL/min/1.73 Final         Failed - Last BP in normal range    BP Readings from Last 1 Encounters:  07/21/22 (!) 144/91         Failed - Valid encounter within last 6 months    Recent Outpatient Visits   None            Passed - Patient is not pregnant       amLODipine (NORVASC) 10 MG tablet [Pharmacy Med Name: AMLODIPINE  BESYLATE 10 MG TAB] 30 tablet 0    Sig: TAKE 1 TABLET BY MOUTH EVERY DAY     Cardiovascular: Calcium Channel Blockers 2 Failed - 05/11/2023  1:36 PM      Failed - Last BP in normal range    BP Readings from Last 1 Encounters:  07/21/22 (!) 144/91         Failed - Valid encounter within last 6 months    Recent Outpatient Visits   None            Passed - Last Heart Rate in normal range    Pulse Readings from Last 1 Encounters:  07/21/22 71

## 2023-06-18 ENCOUNTER — Other Ambulatory Visit: Payer: Self-pay | Admitting: Nurse Practitioner

## 2023-06-18 DIAGNOSIS — I10 Essential (primary) hypertension: Secondary | ICD-10-CM

## 2023-06-18 DIAGNOSIS — E7800 Pure hypercholesterolemia, unspecified: Secondary | ICD-10-CM

## 2023-06-18 NOTE — Telephone Encounter (Signed)
 Copied from CRM [id number]. Topic: Clinical - Medication Refill >> Jun 18, 2023 11:13 AM Ivette P wrote: Medication: atorvastatin  (LIPITOR) 20 MG tablet amLODipine  (NORVASC ) 10 MG tablet irbesartan -hydrochlorothiazide  (AVALIDE) 300-12.5 MG tablet   Has the patient contacted their pharmacy? Yes (Agent: If no, request that the patient contact the pharmacy for the refill. If patient does not wish to contact the pharmacy document the reason why and proceed with request.) (Agent: If yes, when and what did the pharmacy advise?)  This is the patient's preferred pharmacy:  [id number] [place], [place] - [address] [place] [address] Phone: [phone] Fax: [phone]   Is this the correct pharmacy for this prescription? Yes If no, delete pharmacy and type the correct one.   Has the prescription been filled recently? Yes, 04/09/2023  Is the patient out of the medication? Yes  Has the patient been seen for an appointment in the last year OR does the patient have an upcoming appointment? Yes, 06/10  Can we respond through MyChart? No, does not know password  Agent: Please be advised that Rx refills may take up to 3 business days. We ask that you follow-up with your pharmacy.

## 2023-06-22 MED ORDER — ATORVASTATIN CALCIUM 20 MG PO TABS: ORAL_TABLET | ORAL | 0 refills | Status: DC

## 2023-06-22 MED ORDER — AMLODIPINE BESYLATE 10 MG PO TABS: 10.0000 mg | ORAL_TABLET | Freq: Every day | ORAL | 0 refills | Status: DC

## 2023-06-22 MED ORDER — IRBESARTAN-HYDROCHLOROTHIAZIDE 300-12.5 MG PO TABS: 1.0000 | ORAL_TABLET | Freq: Every day | ORAL | 0 refills | Status: DC

## 2023-06-22 NOTE — Telephone Encounter (Signed)
 Requested Prescriptions  Pending Prescriptions Disp Refills   amLODipine  (NORVASC ) 10 MG tablet 90 tablet 0    Sig: Take 1 tablet (10 mg total) by mouth daily.     Cardiovascular: Calcium  Channel Blockers 2 Failed - 06/22/2023  8:26 AM      Failed - Last BP in normal range    BP Readings from Last 1 Encounters:  07/21/22 (!) 144/91         Failed - Valid encounter within last 6 months    Recent Outpatient Visits   None            Passed - Last Heart Rate in normal range    Pulse Readings from Last 1 Encounters:  07/21/22 71          atorvastatin  (LIPITOR) 20 MG tablet 90 tablet 0    Sig: TAKE 1 TABLET BY MOUTH EVERY DAY     Cardiovascular:  Antilipid - Statins Failed - 06/22/2023  8:26 AM      Failed - Valid encounter within last 12 months    Recent Outpatient Visits   None            Failed - Lipid Panel in normal range within the last 12 months    Cholesterol, Total  Date Value Ref Range Status  06/19/2022 238 (H) 100 - 199 mg/dL Final   Cholesterol Piccolo, Waived  Date Value Ref Range Status  10/13/2014 170 <200 mg/dL Final    Comment:                            Desirable                <200                         Borderline High      200- 239                         High                     >239    LDL Chol Calc (NIH)  Date Value Ref Range Status  06/19/2022 162 (H) 0 - 99 mg/dL Final   HDL  Date Value Ref Range Status  06/19/2022 58 >39 mg/dL Final   Triglycerides  Date Value Ref Range Status  06/19/2022 100 0 - 149 mg/dL Final   Triglycerides Piccolo,Waived  Date Value Ref Range Status  10/13/2014 81 <150 mg/dL Final    Comment:                            Normal                   <150                         Borderline High     150 - 199                         High                200 - 499                         Very  High                >499          Passed - Patient is not pregnant       irbesartan -hydrochlorothiazide  (AVALIDE)  300-12.5 MG tablet 90 tablet 0    Sig: Take 1 tablet by mouth daily.     Cardiovascular: ARB + Diuretic Combos Failed - 06/22/2023  8:26 AM      Failed - K in normal range and within 180 days    Potassium  Date Value Ref Range Status  06/19/2022 4.1 3.5 - 5.2 mmol/L Final         Failed - Na in normal range and within 180 days    Sodium  Date Value Ref Range Status  06/19/2022 141 134 - 144 mmol/L Final         Failed - Cr in normal range and within 180 days    Creatinine, Ser  Date Value Ref Range Status  06/19/2022 0.68 0.57 - 1.00 mg/dL Final         Failed - eGFR is 10 or above and within 180 days    GFR calc Af Amer  Date Value Ref Range Status  03/05/2020 117 >59 mL/min/1.73 Final    Comment:    **In accordance with recommendations from the NKF-ASN Task force,**   [facility] is in the process of updating its eGFR calculation to the   2021 CKD-EPI creatinine equation that estimates kidney function   without a race variable.    GFR, Estimated  Date Value Ref Range Status  01/17/2021 >60 >60 mL/min Final    Comment:    (NOTE) Calculated using the CKD-EPI Creatinine Equation (2021)    eGFR  Date Value Ref Range Status  06/19/2022 103 >59 mL/min/1.73 Final         Failed - Last BP in normal range    BP Readings from Last 1 Encounters:  07/21/22 (!) 144/91         Failed - Valid encounter within last 6 months    Recent Outpatient Visits   None            Passed - Patient is not pregnant

## 2023-07-09 ENCOUNTER — Other Ambulatory Visit: Payer: Self-pay | Admitting: Nurse Practitioner

## 2023-07-09 DIAGNOSIS — E7800 Pure hypercholesterolemia, unspecified: Secondary | ICD-10-CM

## 2023-07-10 NOTE — Telephone Encounter (Signed)
 Requested Prescriptions  Pending Prescriptions Disp Refills   atorvastatin  (LIPITOR) 20 MG tablet [Pharmacy Med Name: ATORVASTATIN  20 MG TABLET] 90 tablet 0    Sig: TAKE 1 TABLET BY MOUTH EVERY DAY     Cardiovascular:  Antilipid - Statins Failed - 07/10/2023  9:57 AM      Failed - Valid encounter within last 12 months    Recent Outpatient Visits   None            Failed - Lipid Panel in normal range within the last 12 months    Cholesterol, Total  Date Value Ref Range Status  06/19/2022 238 (H) 100 - 199 mg/dL Final   Cholesterol Piccolo, Waived  Date Value Ref Range Status  10/13/2014 170 <200 mg/dL Final    Comment:                            Desirable                <200                         Borderline High      200- 239                         High                     >239    LDL Chol Calc (NIH)  Date Value Ref Range Status  06/19/2022 162 (H) 0 - 99 mg/dL Final   HDL  Date Value Ref Range Status  06/19/2022 58 >39 mg/dL Final   Triglycerides  Date Value Ref Range Status  06/19/2022 100 0 - 149 mg/dL Final   Triglycerides Piccolo,Waived  Date Value Ref Range Status  10/13/2014 81 <150 mg/dL Final    Comment:                            Normal                   <150                         Borderline High     150 - 199                         High                200 - 499                         Very High                >499          Passed - Patient is not pregnant

## 2023-07-14 ENCOUNTER — Ambulatory Visit: Admitting: Nurse Practitioner

## 2023-07-31 ENCOUNTER — Ambulatory Visit: Admitting: Nurse Practitioner

## 2023-09-16 ENCOUNTER — Other Ambulatory Visit: Payer: Self-pay | Admitting: Nurse Practitioner

## 2023-09-16 DIAGNOSIS — I10 Essential (primary) hypertension: Secondary | ICD-10-CM

## 2023-09-18 NOTE — Telephone Encounter (Signed)
 Called patient to schedule OV for medication refills. No answer, left VM to call back.  Unable to refill per protocol, appointment needed.   Requested Prescriptions  Pending Prescriptions Disp Refills   amLODipine  (NORVASC ) 10 MG tablet [Pharmacy Med Name: AMLODIPINE  BESYLATE 10 MG TAB] 90 tablet 0    Sig: TAKE 1 TABLET BY MOUTH EVERY DAY     Cardiovascular: Calcium  Channel Blockers 2 Failed - 09/18/2023 12:57 PM      Failed - Last BP in normal range    BP Readings from Last 1 Encounters:  07/21/22 (!) 144/91         Failed - Valid encounter within last 6 months    Recent Outpatient Visits   None            Passed - Last Heart Rate in normal range    Pulse Readings from Last 1 Encounters:  07/21/22 71          irbesartan -hydrochlorothiazide  (AVALIDE) 300-12.5 MG tablet [Pharmacy Med Name: IRBESARTAN -HCTZ 300-12.5 MG TB] 90 tablet 0    Sig: TAKE 1 TABLET BY MOUTH EVERY DAY     Cardiovascular: ARB + Diuretic Combos Failed - 09/18/2023 12:57 PM      Failed - K in normal range and within 180 days    Potassium  Date Value Ref Range Status  06/19/2022 4.1 3.5 - 5.2 mmol/L Final         Failed - Na in normal range and within 180 days    Sodium  Date Value Ref Range Status  06/19/2022 141 134 - 144 mmol/L Final         Failed - Cr in normal range and within 180 days    Creatinine, Ser  Date Value Ref Range Status  06/19/2022 0.68 0.57 - 1.00 mg/dL Final         Failed - eGFR is 10 or above and within 180 days    GFR calc Af Amer  Date Value Ref Range Status  03/05/2020 117 >59 mL/min/1.73 Final    Comment:    **In accordance with recommendations from the NKF-ASN Task force,**   [facility] is in the process of updating its eGFR calculation to the   2021 CKD-EPI creatinine equation that estimates kidney function   without a race variable.    GFR, Estimated  Date Value Ref Range Status  01/17/2021 >60 >60 mL/min Final    Comment:    (NOTE) Calculated using the  CKD-EPI Creatinine Equation (2021)    eGFR  Date Value Ref Range Status  06/19/2022 103 >59 mL/min/1.73 Final         Failed - Last BP in normal range    BP Readings from Last 1 Encounters:  07/21/22 (!) 144/91         Failed - Valid encounter within last 6 months    Recent Outpatient Visits   None            Passed - Patient is not pregnant

## 2023-09-18 NOTE — Telephone Encounter (Signed)
 Called patient to schedule OV for medication refills. No answer, left VM to call back.

## 2023-10-07 ENCOUNTER — Encounter: Payer: Self-pay | Admitting: Pediatrics

## 2023-10-07 ENCOUNTER — Ambulatory Visit (INDEPENDENT_AMBULATORY_CARE_PROVIDER_SITE_OTHER): Admitting: Pediatrics

## 2023-10-07 VITALS — BP 171/113 | HR 73 | Temp 98.4°F | Resp 15 | Ht 62.99 in | Wt 209.0 lb

## 2023-10-07 DIAGNOSIS — Z6837 Body mass index (BMI) 37.0-37.9, adult: Secondary | ICD-10-CM

## 2023-10-07 DIAGNOSIS — I10 Essential (primary) hypertension: Secondary | ICD-10-CM | POA: Diagnosis not present

## 2023-10-07 DIAGNOSIS — E7800 Pure hypercholesterolemia, unspecified: Secondary | ICD-10-CM

## 2023-10-07 MED ORDER — ATORVASTATIN CALCIUM 20 MG PO TABS
ORAL_TABLET | ORAL | 3 refills | Status: AC
Start: 2023-10-07 — End: ?

## 2023-10-07 MED ORDER — HYDRALAZINE HCL 25 MG PO TABS: 25.0000 mg | ORAL_TABLET | Freq: Three times a day (TID) | ORAL | 1 refills | Status: AC

## 2023-10-07 MED ORDER — AMLODIPINE BESYLATE 10 MG PO TABS: 10.0000 mg | ORAL_TABLET | Freq: Every day | ORAL | 3 refills | Status: AC

## 2023-10-07 MED ORDER — IRBESARTAN-HYDROCHLOROTHIAZIDE 300-12.5 MG PO TABS
1.0000 | ORAL_TABLET | Freq: Every day | ORAL | 3 refills | Status: AC
Start: 2023-10-07 — End: ?

## 2023-10-07 NOTE — Progress Notes (Signed)
 Office Visit  BP (!) 171/113 (BP Location: Left Arm, Patient Position: Sitting, Cuff Size: Large)   Pulse 73   Temp 98.4 F (36.9 C) (Oral)   Resp 15   Ht 5' 2.99 (1.6 m)   Wt 209 lb (94.8 kg)   SpO2 96%   BMI 37.03 kg/m    Subjective:    Patient ID: Rhonda Stanley, female    DOB: 1965/08/06, 58 y.o.   MRN: [id number]  HPI: Rhonda Stanley is a 58 y.o. female  Chief Complaint  Patient presents with   Hypertension    Out of meds for about 2 weeks.     Discussed the use of AI scribe software for clinical note transcription with the patient, who gave verbal consent to proceed.  History of Present Illness   Rhonda Stanley is a 58 year old female with hypertension who presents for medication refills.  She has run out of two of her antihypertensive medications. Typically, her blood pressure is well-managed with medication, but the lapse has resulted in elevated readings today.  She works at [employer] and finds it challenging to attend medical appointments due to her work schedule. She has a blood pressure cuff at home but feels she might need a better one to monitor her blood pressure effectively.  She has an upcoming appointment with her primary care provider at the end of October and plans to have blood work done today to ensure her primary care provider has the results for that visit.        Relevant past medical, surgical, family and social history reviewed and updated as indicated. Interim medical history since our last visit reviewed. Allergies and medications reviewed and updated.  ROS per HPI unless specifically indicated above     Objective:    BP (!) 171/113 (BP Location: Left Arm, Patient Position: Sitting, Cuff Size: Large)   Pulse 73   Temp 98.4 F (36.9 C) (Oral)   Resp 15   Ht 5' 2.99 (1.6 m)   Wt 209 lb (94.8 kg)   SpO2 96%   BMI 37.03 kg/m   Wt Readings from Last 3 Encounters:  10/07/23 209 lb (94.8 kg)  07/21/22 189 lb 9.6 oz (86 kg)   06/19/22 185 lb 11.2 oz (84.2 kg)     Physical Exam Constitutional:      Appearance: Normal appearance.  Cardiovascular:     Rate and Rhythm: Normal rate and regular rhythm.     Pulses: Normal pulses.     Heart sounds: Normal heart sounds.  Pulmonary:     Effort: Pulmonary effort is normal.     Breath sounds: Normal breath sounds.  Musculoskeletal:        General: Normal range of motion.  Skin:    Comments: Normal skin color  Neurological:     General: No focal deficit present.     Mental Status: She is alert. Mental status is at baseline.  Psychiatric:        Mood and Affect: Mood normal.        Behavior: Behavior normal.        Thought Content: Thought content normal.         10/07/2023    2:35 PM 07/21/2022    3:49 PM 06/19/2022    1:31 PM 09/27/2021    2:22 PM 12/05/2020    3:19 PM  Depression screen PHQ 2/9  Decreased Interest 0 1 1 0 1  Down, Depressed, Hopeless 0 1  1 0 0  PHQ - 2 Score 0 2 2 0 1  Altered sleeping 1 1 1  0 0  Tired, decreased energy 1 1 2  0 1  Change in appetite 2 1 1  0 0  Feeling bad or failure about yourself  1 1 2  0 0  Trouble concentrating 0 1 2 0 0  Moving slowly or fidgety/restless 0 0 1 0 0  Suicidal thoughts 0 0 0 0 0  PHQ-9 Score 5 7 11  0 2  Difficult doing work/chores Not difficult at all Not difficult at all Somewhat difficult Not difficult at all        10/07/2023    2:36 PM 07/21/2022    3:49 PM 06/19/2022    1:32 PM 09/27/2021    2:22 PM  GAD 7 : Generalized Anxiety Score  Nervous, Anxious, on Edge 1 1 0 1  Control/stop worrying 1 1 1 1   Worry too much - different things 1 1 1 1   Trouble relaxing 1 1 1  0  Restless 1 1 1  0  Easily annoyed or irritable 1 1 1 1   Afraid - awful might happen 0 1 1 0  Total GAD 7 Score 6 7 6 4   Anxiety Difficulty Not difficult at all Not difficult at all Somewhat difficult Not difficult at all       Assessment & Plan:  Assessment & Plan   Essential hypertension, benign Assessment &  Plan: Blood pressure elevated due to medication lapse. Advised home monitoring due to visit difficulty. - Send prescription refills with three refills to [facility] in [place] on [address]. - Advise home blood pressure monitoring to maintain levels below 140/90 mmHg. - Instruct to call if blood pressure is elevated for coordination between visits.  Orders: -     amLODIPine  Besylate; Take 1 tablet (10 mg total) by mouth daily.  Dispense: 90 tablet; Refill: 3 -     Irbesartan -hydroCHLOROthiazide ; Take 1 tablet by mouth daily.  Dispense: 90 tablet; Refill: 3 -     hydrALAZINE  HCl; Take 1 tablet (25 mg total) by mouth 3 (three) times daily.  Dispense: 270 tablet; Refill: 1  Hypercholesterolemia Assessment & Plan: Due for labs and sent refills.  Orders: -     Atorvastatin  Calcium ; TAKE 1 TABLET BY MOUTH EVERY DAY  Dispense: 90 tablet; Refill: 3 -     Lipid panel  BMI 37.0-37.9, adult Assessment & Plan: Risk stratification below. Recommend PCP follow up to discuss lifestyle modifications as well.  Orders: -     Comprehensive metabolic panel with GFR -     Hemoglobin A1c     Follow up plan: No follow-ups on file.  Rhonda SHAUNNA Nett, MD

## 2023-10-07 NOTE — Patient Instructions (Signed)
Measure your blood pressure at home! Home Blood Pressure Monitoring is an important part of managing blood pressure and thought to be more accurate than the measures we get in the clinic.  Here's some tips on how to take your blood pressure accurately at home and some highly rated monitors. Most insurances (except for Medicaid) won't pay for monitors, so unfortunately they are an out-of-pocket expense for most people.  Taking an accurate blood pressure measurement: To get an accurate blood pressure reading, empty your bladder first, then rest in a seated position for at least 5 minutes. Ideally, no caffeine or tobacco use in last 30 minutes. Use an arm cuff (not wrist - see recommendations below) seated in a chair with a back next to a table or object that is high enough that you can rest your arm so the blood pressure cuff is at the level of your heart and you can lean back comfortably. Keep both feet on the floor and don't talk while the machine is working. Checking at different times of the day can be helpful to get an idea of your average numbers. Your goal blood pressure should be <140/90.

## 2023-10-08 LAB — COMPREHENSIVE METABOLIC PANEL WITH GFR
ALT: 27 IU/L (ref 0–32)
AST: 23 IU/L (ref 0–40)
Albumin: 3.8 g/dL (ref 3.8–4.9)
Alkaline Phosphatase: 108 IU/L (ref 44–121)
BUN/Creatinine Ratio: 15 (ref 9–23)
BUN: 14 mg/dL (ref 6–24)
Bilirubin Total: 0.6 mg/dL (ref 0.0–1.2)
CO2: 23 mmol/L (ref 20–29)
Calcium: 9.8 mg/dL (ref 8.7–10.2)
Chloride: 105 mmol/L (ref 96–106)
Creatinine, Ser: 0.92 mg/dL (ref 0.57–1.00)
Globulin, Total: 3.3 g/dL (ref 1.5–4.5)
Glucose: 97 mg/dL (ref 70–99)
Potassium: 4.4 mmol/L (ref 3.5–5.2)
Sodium: 141 mmol/L (ref 134–144)
Total Protein: 7.1 g/dL (ref 6.0–8.5)
eGFR: 73 mL/min/1.73 (ref >59)

## 2023-10-08 LAB — LIPID PANEL
Chol/HDL Ratio: 3.7 ratio (ref 0.0–4.4)
Cholesterol, Total: 172 mg/dL (ref 100–199)
HDL: 46 mg/dL (ref >39)
LDL Chol Calc (NIH): 98 mg/dL (ref 0–99)
Triglycerides: 163 mg/dL — ABNORMAL HIGH (ref 0–149)
VLDL Cholesterol Cal: 28 mg/dL (ref 5–40)

## 2023-10-08 LAB — HEMOGLOBIN A1C
Est. average glucose Bld gHb Est-mCnc: 114 mg/dL
Hgb A1c MFr Bld: 5.6 % (ref 4.8–5.6)

## 2023-10-09 ENCOUNTER — Ambulatory Visit: Payer: Self-pay | Admitting: Pediatrics

## 2023-10-19 NOTE — Assessment & Plan Note (Signed)
 Risk stratification below. Recommend PCP follow up to discuss lifestyle modifications as well.

## 2023-10-19 NOTE — Assessment & Plan Note (Signed)
 Blood pressure elevated due to medication lapse. Advised home monitoring due to visit difficulty. - Send prescription refills with three [facility] to [facility] [place] [facility] [address]. - Advise home blood pressure monitoring to maintain levels below 140/90 mmHg. - Instruct to call if blood pressure is elevated for coordination between visits.

## 2023-10-19 NOTE — Assessment & Plan Note (Signed)
 Due for labs and sent refills.

## 2023-12-03 ENCOUNTER — Other Ambulatory Visit: Payer: Self-pay | Admitting: Nurse Practitioner

## 2023-12-03 ENCOUNTER — Ambulatory Visit
Admission: RE | Admit: 2023-12-03 | Discharge: 2023-12-03 | Disposition: A | Discharge status: Home / Self Care | Source: Ambulatory Visit | Attending: Nurse Practitioner | Admitting: Nurse Practitioner

## 2023-12-03 DIAGNOSIS — Z1231 Encounter for screening mammogram for malignant neoplasm of breast: Secondary | ICD-10-CM

## 2023-12-04 ENCOUNTER — Other Ambulatory Visit (HOSPITAL_COMMUNITY)
Admission: RE | Admit: 2023-12-04 | Discharge: 2023-12-04 | Disposition: A | Discharge status: Home / Self Care | Source: Ambulatory Visit | Attending: Nurse Practitioner | Admitting: Nurse Practitioner

## 2023-12-04 ENCOUNTER — Ambulatory Visit: Admitting: Nurse Practitioner

## 2023-12-04 ENCOUNTER — Encounter: Payer: Self-pay | Admitting: Nurse Practitioner

## 2023-12-04 VITALS — BP 142/87 | HR 88 | Temp 97.8°F | Ht 62.9 in | Wt 199.6 lb

## 2023-12-04 DIAGNOSIS — Z1211 Encounter for screening for malignant neoplasm of colon: Secondary | ICD-10-CM

## 2023-12-04 DIAGNOSIS — I10 Essential (primary) hypertension: Secondary | ICD-10-CM | POA: Diagnosis not present

## 2023-12-04 DIAGNOSIS — Z23 Encounter for immunization: Secondary | ICD-10-CM | POA: Diagnosis not present

## 2023-12-04 DIAGNOSIS — Z0000 Encounter for general adult medical examination without abnormal findings: Secondary | ICD-10-CM | POA: Diagnosis not present

## 2023-12-04 DIAGNOSIS — E7800 Pure hypercholesterolemia, unspecified: Secondary | ICD-10-CM

## 2023-12-04 NOTE — Assessment & Plan Note (Signed)
Chronic.  Controlled.  Continue with current medication regimen of Atorvastatin daily.  Refills sent today.  Labs ordered today.  Return to clinic in 6 months for reevaluation.  Call sooner if concerns arise.    

## 2023-12-04 NOTE — Assessment & Plan Note (Addendum)
 Chronic. Not well controlled.  However, patient has not been taking her hydralazine  as prescribed.  Educated on how to properly take medication.  Continue with Irbesartan , HCTZ, and Amlodipine .  Recommend checking blood pressures at home.  If still elevated with Hydralazine  25mg  TID return for sooner appointment.  Labs ordered.  Refills sent.  Follow up in 6 months.  Call sooner if concerns arise.

## 2023-12-04 NOTE — Progress Notes (Signed)
 BP (!) 142/87 (BP Location: Left Arm, Cuff Size: Large)   Pulse 88   Temp 97.8 F (36.6 C) (Oral)   Ht 5' 2.9 (1.598 m)   Wt 199 lb 9.6 oz (90.5 kg)   SpO2 97%   BMI 35.47 kg/m    Subjective:    Patient ID: Rhonda Stanley, female    DOB: Jun 03, 1965, 58 y.o.   MRN: [id number]  HPI: Rhonda Stanley is a 58 y.o. female presenting on 12/04/2023 for comprehensive medical examination. Current medical complaints include: Had her mammogram done yesterday  She currently lives with: Menopausal Symptoms: no  HYPERTENSION without Chronic Kidney Disease Has only been taking Hydralazine  25mg  once daily. Hypertension status: controlled  Satisfied with current treatment? no Duration of hypertension: years BP monitoring frequency:  not checking BP range:  BP medication side effects:  no Medication compliance: poor compliance Previous BP meds:amlodipine , HCTZ, and irbesartan  (avapro ) Aspirin : no Recurrent headaches: no Visual changes: no Palpitations: no Dyspnea: no Chest pain: no Lower extremity edema: no Dizzy/lightheaded: no  Feels like she is stressed.  Does not feel depressed.   Depression Screen done today and results listed below:     12/04/2023   10:05 AM 10/07/2023    2:35 PM 07/21/2022    3:49 PM 06/19/2022    1:31 PM 09/27/2021    2:22 PM  Depression screen PHQ 2/9  Decreased Interest 0 0 1 1 0  Down, Depressed, Hopeless 0 0 1 1 0  PHQ - 2 Score 0 0 2 2 0  Altered sleeping 0 1 1 1  0  Tired, decreased energy 1 1 1 2  0  Change in appetite 1 2 1 1  0  Feeling bad or failure about yourself  0 1 1 2  0  Trouble concentrating 0 0 1 2 0  Moving slowly or fidgety/restless 0 0 0 1 0  Suicidal thoughts 0 0 0 0 0  PHQ-9 Score 2 5 7 11  0  Difficult doing work/chores Not difficult at all Not difficult at all Not difficult at all Somewhat difficult Not difficult at all    The patient does not have a history of falls. I did complete a risk assessment for falls. A plan of  care for falls was documented.   Past Medical History:  Past Medical History:  Diagnosis Date   Hypertension     Surgical History:  Past Surgical History:  Procedure Laterality Date   ORIF TIBIA PLATEAU Right 01/16/2021   Procedure: OPEN REDUCTION INTERNAL FIXATION (ORIF) TIBIAL PLATEAU;  Surgeon: Edna Toribio LABOR, MD;  Location: [facility];  Service: Orthopedics;  Laterality: Right;   SHOULDER SURGERY Right 2011   TOTAL KNEE ARTHROPLASTY WITH REVISION COMPONENTS Right 01/16/2021   Procedure: TOTAL KNEE ARTHROPLASTY WITH REVISION COMPONENTS;  Surgeon: Edna Toribio LABOR, MD;  Location: [facility];  Service: Orthopedics;  Laterality: Right;    Medications:  Current Outpatient Medications on File Prior to Visit  Medication Sig   amLODipine  (NORVASC ) 10 MG tablet Take 1 tablet (10 mg total) by mouth daily.   atorvastatin  (LIPITOR) 20 MG tablet TAKE 1 TABLET BY MOUTH EVERY DAY   hydrALAZINE  (APRESOLINE ) 25 MG tablet Take 1 tablet (25 mg total) by mouth 3 (three) times daily.   irbesartan -hydrochlorothiazide  (AVALIDE) 300-12.5 MG tablet Take 1 tablet by mouth daily.   PARAGARD INTRAUTERINE COPPER IUD IUD 1 each by Intrauterine route once.   No current facility-administered medications on file prior to visit.    Allergies:  No Known Allergies  Social History:  Social History   Socioeconomic History   Marital status: Legally Separated    Spouse name: Not on file   Number of children: Not on file   Years of education: Not on file   Highest education level: Not on file  Occupational History   Occupation: [employer]  Tobacco Use   Smoking status: Never   Smokeless tobacco: Never  Vaping Use   Vaping status: Never Used  Substance and Sexual Activity   Alcohol use: Yes    Alcohol/week: 3.0 standard drinks of alcohol    Types: 3 Standard drinks or equivalent per week    Comment: on occasion   Drug use: No   Sexual activity: Yes    Partners: Male    Birth control/protection:  I.U.D.  Other Topics Concern   Not on file  Social History Narrative   Not on file   Social Drivers of Health   Financial Resource Strain: Not on file  Food Insecurity: Not on file  Transportation Needs: Not on file  Physical Activity: Not on file  Stress: Not on file  Social Connections: Not on file  Intimate Partner Violence: Not on file   Social History   Tobacco Use  Smoking Status Never  Smokeless Tobacco Never   Social History   Substance and Sexual Activity  Alcohol Use Yes   Alcohol/week: 3.0 standard drinks of alcohol   Types: 3 Standard drinks or equivalent per week   Comment: on occasion    Family History:  Family History  Problem Relation Age of Onset   Breast cancer Mother 32   Hypertension Mother    Hypertension Father     Past medical history, surgical history, medications, allergies, family history and social history reviewed with patient today and changes made to appropriate areas of the chart.   Review of Systems  Eyes:  Negative for blurred vision and double vision.  Respiratory:  Negative for shortness of breath.   Cardiovascular:  Negative for chest pain, palpitations and leg swelling.  Neurological:  Negative for dizziness and headaches.   All other ROS negative except what is listed above and in the HPI.      Objective:    BP (!) 142/87 (BP Location: Left Arm, Cuff Size: Large)   Pulse 88   Temp 97.8 F (36.6 C) (Oral)   Ht 5' 2.9 (1.598 m)   Wt 199 lb 9.6 oz (90.5 kg)   SpO2 97%   BMI 35.47 kg/m   Wt Readings from Last 3 Encounters:  12/04/23 199 lb 9.6 oz (90.5 kg)  10/07/23 209 lb (94.8 kg)  07/21/22 189 lb 9.6 oz (86 kg)    Physical Exam Vitals and nursing note reviewed. Exam conducted with a chaperone present Rhonda Stanley, CMA).  Constitutional:      General: She is awake. She is not in acute distress.    Appearance: Normal appearance. She is well-developed. She is obese. She is not ill-appearing.  HENT:      Head: Normocephalic and atraumatic.     Right Ear: Hearing, tympanic membrane, ear canal and external ear normal. No drainage.     Left Ear: Hearing, tympanic membrane, ear canal and external ear normal. No drainage.     Nose: Nose normal.     Right Sinus: No maxillary sinus tenderness or frontal sinus tenderness.     Left Sinus: No maxillary sinus tenderness or frontal sinus tenderness.     Mouth/Throat:  Mouth: Mucous membranes are moist.     Pharynx: Oropharynx is clear. Uvula midline. No pharyngeal swelling, oropharyngeal exudate or posterior oropharyngeal erythema.  Eyes:     General: Lids are normal.        Right eye: No discharge.        Left eye: No discharge.     Extraocular Movements: Extraocular movements intact.     Conjunctiva/sclera: Conjunctivae normal.     Pupils: Pupils are equal, round, and reactive to light.     Visual Fields: Right eye visual fields normal and left eye visual fields normal.  Neck:     Thyroid: No thyromegaly.     Vascular: No carotid bruit.     Trachea: Trachea normal.  Cardiovascular:     Rate and Rhythm: Normal rate and regular rhythm.     Heart sounds: Normal heart sounds. No murmur heard.    No gallop.  Pulmonary:     Effort: Pulmonary effort is normal. No accessory muscle usage or respiratory distress.     Breath sounds: Normal breath sounds.  Chest:  Breasts:    Right: Normal.     Left: Normal.  Abdominal:     General: Bowel sounds are normal.     Palpations: Abdomen is soft. There is no hepatomegaly or splenomegaly.     Tenderness: There is no abdominal tenderness.  Genitourinary:    Exam position: Knee-chest position.     Vagina: Normal.     Cervix: Normal.     Adnexa: Right adnexa normal and left adnexa normal.  Musculoskeletal:        General: Normal range of motion.     Cervical back: Normal range of motion and neck supple.     Right lower leg: No edema.     Left lower leg: No edema.  Lymphadenopathy:     Head:      Right side of head: No submental, submandibular, tonsillar, preauricular or posterior auricular adenopathy.     Left side of head: No submental, submandibular, tonsillar, preauricular or posterior auricular adenopathy.     Cervical: No cervical adenopathy.     Upper Body:     Right upper body: No supraclavicular, axillary or pectoral adenopathy.     Left upper body: No supraclavicular, axillary or pectoral adenopathy.  Skin:    General: Skin is warm and dry.     Capillary Refill: Capillary refill takes less than 2 seconds.     Findings: No rash.  Neurological:     Mental Status: She is alert and oriented to person, place, and time.     Gait: Gait is intact.  Psychiatric:        Attention and Perception: Attention normal.        Mood and Affect: Mood normal.        Speech: Speech normal.        Behavior: Behavior normal. Behavior is cooperative.        Thought Content: Thought content normal.        Judgment: Judgment normal.     Results for orders placed or performed in visit on 10/07/23  Comp Met (CMET)   Collection Time: 10/07/23  3:05 PM  Result Value Ref Range   Glucose 97 70 - 99 mg/dL   BUN 14 6 - 24 mg/dL   Creatinine, Ser 9.07 0.57 - 1.00 mg/dL   eGFR 73 >40 fO/fpw/8.26   BUN/Creatinine Ratio 15 9 - 23   Sodium 141 134 - 144 mmol/L  Potassium 4.4 3.5 - 5.2 mmol/L   Chloride 105 96 - 106 mmol/L   CO2 23 20 - 29 mmol/L   Calcium  9.8 8.7 - 10.2 mg/dL   Total Protein 7.1 6.0 - 8.5 g/dL   Albumin 3.8 3.8 - 4.9 g/dL   Globulin, Total 3.3 1.5 - 4.5 g/dL   Bilirubin Total 0.6 0.0 - 1.2 mg/dL   Alkaline Phosphatase 108 44 - 121 IU/L   AST 23 0 - 40 IU/L   ALT 27 0 - 32 IU/L  HgB A1c   Collection Time: 10/07/23  3:05 PM  Result Value Ref Range   Hgb A1c MFr Bld 5.6 4.8 - 5.6 %   Est. average glucose Bld gHb Est-mCnc 114 mg/dL  Lipid Profile   Collection Time: 10/07/23  3:05 PM  Result Value Ref Range   Cholesterol, Total 172 100 - 199 mg/dL   Triglycerides 836 (H)  0 - 149 mg/dL   HDL 46 >60 mg/dL   VLDL Cholesterol Cal 28 5 - 40 mg/dL   LDL Chol Calc (NIH) 98 0 - 99 mg/dL   Chol/HDL Ratio 3.7 0.0 - 4.4 ratio      Assessment & Plan:   Problem List Items Addressed This Visit       Cardiovascular and Mediastinum   Essential hypertension, benign   Chronic. Not well controlled.  However, patient has not been taking her hydralazine  as prescribed.  Educated on how to properly take medication.  Continue with Irbesartan , HCTZ, and Amlodipine .  Recommend checking blood pressures at home.  If still elevated with Hydralazine  25mg  TID return for sooner appointment.  Labs ordered.  Refills sent.  Follow up in 6 months.  Call sooner if concerns arise.         Other   Hypercholesterolemia   Chronic.  Controlled.  Continue with current medication regimen of Atorvastatin  daily.  Refills sent today.  Labs ordered today.  Return to clinic in 6 months for reevaluation.  Call sooner if concerns arise.        Relevant Orders   Lipid panel   Other Visit Diagnoses       Annual physical exam    -  Primary   Health maintenance reviewed during visit today.  Labs ordered.  Vaccines reviewed.  PAP done. Mammogram up to date.  Colonoscopy ordered.   Relevant Orders   CBC with Differential/Platelet   Comprehensive metabolic panel with GFR   Lipid panel   TSH   Cytology - PAP     Need for influenza vaccination       Relevant Orders   Flu vaccine trivalent PF, 6mos and older(Flulaval,Afluria,Fluarix,Fluzone) (Completed)     Screening for colon cancer       Relevant Orders   Ambulatory referral to Gastroenterology        Follow up plan: Return in about 6 months (around 06/02/2024) for HTN, HLD, DM2 FU.   LABORATORY TESTING:  - Pap smear:  up to date  IMMUNIZATIONS:   - Tdap: Tetanus vaccination status reviewed: last tetanus booster within 10 years. - Influenza: postponed till flu season - Pneumovax: not applicable - Prevnar: not applicable  - COVID:  NA - HPV: NA - Shingrix vaccine: NA  SCREENING: -Mammogram: Done yesterday - Colonoscopy: Referral placed - Bone Density: NA -Hearing Test: NA -Spirometry: NA  PATIENT COUNSELING:   Advised to take 1 mg of folate supplement per day if capable of pregnancy.   Sexuality: Discussed sexually transmitted diseases, partner  selection, use of condoms, avoidance of unintended pregnancy  and contraceptive alternatives.   Advised to avoid cigarette smoking.  I discussed with the patient that most people either abstain from alcohol or drink within safe limits (<=14/week and <=4 drinks/occasion for males, <=7/weeks and <= 3 drinks/occasion for females) and that the risk for alcohol disorders and other health effects rises proportionally with the number of drinks per week and how often a drinker exceeds daily limits.  Discussed cessation/primary prevention of drug use and availability of treatment for abuse.   Diet: Encouraged to adjust caloric intake to maintain  or achieve ideal body weight, to reduce intake of dietary saturated fat and total fat, to limit sodium intake by avoiding high sodium foods and not adding table salt, and to maintain adequate dietary potassium and calcium  preferably from fresh fruits, vegetables, and low-fat dairy products.    stressed the importance of regular exercise  Injury prevention: Discussed safety belts, safety helmets, smoke detector, smoking near bedding or upholstery.   Dental health: Discussed importance of regular tooth brushing, flossing, and dental visits.    NEXT PREVENTATIVE PHYSICAL DUE IN 1 YEAR. Return in about 6 months (around 06/02/2024) for HTN, HLD, DM2 FU.

## 2023-12-09 ENCOUNTER — Ambulatory Visit: Payer: Self-pay | Admitting: Nurse Practitioner

## 2023-12-09 DIAGNOSIS — R87610 Atypical squamous cells of undetermined significance on cytologic smear of cervix (ASC-US): Secondary | ICD-10-CM

## 2023-12-09 LAB — CYTOLOGY - PAP
Adequacy: ABSENT
Comment: NEGATIVE
Diagnosis: UNDETERMINED — AB
High risk HPV: NEGATIVE

## 2023-12-09 MED ORDER — FLUCONAZOLE 150 MG PO TABS: 150.0000 mg | ORAL_TABLET | Freq: Once | ORAL | 0 refills | Status: AC

## 2024-06-02 ENCOUNTER — Ambulatory Visit: Admitting: Nurse Practitioner
